# Patient Record
Sex: Female | Born: 1940
Health system: Southern US, Community
[De-identification: ages and names within clinical notes are randomized; demographics above are authoritative.]

## PROBLEM LIST (undated history)

## (undated) DIAGNOSIS — E43 Unspecified severe protein-calorie malnutrition: Secondary | ICD-10-CM

## (undated) DIAGNOSIS — R636 Underweight: Secondary | ICD-10-CM

## (undated) DIAGNOSIS — I639 Cerebral infarction, unspecified: Secondary | ICD-10-CM

## (undated) DIAGNOSIS — R42 Dizziness and giddiness: Secondary | ICD-10-CM

## (undated) DIAGNOSIS — G9340 Encephalopathy, unspecified: Principal | ICD-10-CM

## (undated) DIAGNOSIS — F0390 Unspecified dementia without behavioral disturbance: Secondary | ICD-10-CM

## (undated) HISTORY — DX: Unspecified severe protein-calorie malnutrition: E43

## (undated) HISTORY — DX: Unspecified dementia, unspecified severity, without behavioral disturbance, psychotic disturbance, mood disturbance, and anxiety: F03.90

## (undated) HISTORY — DX: Underweight: R63.6

## (undated) HISTORY — DX: Dizziness and giddiness: R42

## (undated) HISTORY — DX: Encephalopathy, unspecified: G93.40

---

## 2010-06-22 DIAGNOSIS — G9340 Encephalopathy, unspecified: Secondary | ICD-10-CM

## 2010-06-22 HISTORY — DX: Encephalopathy, unspecified: G93.40

## 2015-05-28 DIAGNOSIS — R404 Transient alteration of awareness: Secondary | ICD-10-CM | POA: Diagnosis not present

## 2015-05-28 DIAGNOSIS — R55 Syncope and collapse: Secondary | ICD-10-CM | POA: Diagnosis not present

## 2015-06-08 ENCOUNTER — Emergency Department (HOSPITAL_COMMUNITY): Payer: Commercial Managed Care - HMO

## 2015-06-08 ENCOUNTER — Observation Stay (HOSPITAL_COMMUNITY): Payer: Commercial Managed Care - HMO

## 2015-06-08 ENCOUNTER — Inpatient Hospital Stay (HOSPITAL_COMMUNITY)
Admission: EM | Admit: 2015-06-08 | Discharge: 2015-06-12 | DRG: 640 | Disposition: A | Discharge status: Skilled Nursing Facility | Payer: Commercial Managed Care - HMO | Attending: Internal Medicine | Admitting: Internal Medicine

## 2015-06-08 ENCOUNTER — Encounter (HOSPITAL_COMMUNITY): Payer: Self-pay

## 2015-06-08 DIAGNOSIS — R4781 Slurred speech: Secondary | ICD-10-CM | POA: Diagnosis present

## 2015-06-08 DIAGNOSIS — Z8673 Personal history of transient ischemic attack (TIA), and cerebral infarction without residual deficits: Secondary | ICD-10-CM

## 2015-06-08 DIAGNOSIS — A09 Infectious gastroenteritis and colitis, unspecified: Secondary | ICD-10-CM | POA: Diagnosis not present

## 2015-06-08 DIAGNOSIS — Z87898 Personal history of other specified conditions: Secondary | ICD-10-CM

## 2015-06-08 DIAGNOSIS — R636 Underweight: Secondary | ICD-10-CM | POA: Diagnosis present

## 2015-06-08 DIAGNOSIS — R55 Syncope and collapse: Secondary | ICD-10-CM | POA: Diagnosis not present

## 2015-06-08 DIAGNOSIS — R41841 Cognitive communication deficit: Secondary | ICD-10-CM | POA: Diagnosis not present

## 2015-06-08 DIAGNOSIS — E860 Dehydration: Secondary | ICD-10-CM | POA: Diagnosis present

## 2015-06-08 DIAGNOSIS — Z87440 Personal history of urinary (tract) infections: Secondary | ICD-10-CM | POA: Diagnosis not present

## 2015-06-08 DIAGNOSIS — R262 Difficulty in walking, not elsewhere classified: Secondary | ICD-10-CM | POA: Diagnosis not present

## 2015-06-08 DIAGNOSIS — R634 Abnormal weight loss: Secondary | ICD-10-CM

## 2015-06-08 DIAGNOSIS — G9340 Encephalopathy, unspecified: Secondary | ICD-10-CM | POA: Diagnosis present

## 2015-06-08 DIAGNOSIS — E43 Unspecified severe protein-calorie malnutrition: Secondary | ICD-10-CM | POA: Diagnosis present

## 2015-06-08 DIAGNOSIS — R278 Other lack of coordination: Secondary | ICD-10-CM | POA: Diagnosis not present

## 2015-06-08 DIAGNOSIS — F0390 Unspecified dementia without behavioral disturbance: Secondary | ICD-10-CM | POA: Diagnosis present

## 2015-06-08 DIAGNOSIS — Z681 Body mass index (BMI) 19 or less, adult: Secondary | ICD-10-CM

## 2015-06-08 DIAGNOSIS — R531 Weakness: Secondary | ICD-10-CM | POA: Diagnosis not present

## 2015-06-08 DIAGNOSIS — M6281 Muscle weakness (generalized): Secondary | ICD-10-CM | POA: Diagnosis not present

## 2015-06-08 DIAGNOSIS — R42 Dizziness and giddiness: Secondary | ICD-10-CM

## 2015-06-08 DIAGNOSIS — R197 Diarrhea, unspecified: Secondary | ICD-10-CM | POA: Diagnosis present

## 2015-06-08 LAB — CBC WITH DIFFERENTIAL/PLATELET
BASOS ABS: 0 10*3/uL (ref 0.0–0.1)
Basophils Relative: 0 %
Eosinophils Absolute: 0.1 10*3/uL (ref 0.0–0.7)
Eosinophils Relative: 1 %
HCT: 41.1 % (ref 36.0–46.0)
HEMOGLOBIN: 13.9 g/dL (ref 12.0–15.0)
LYMPHS ABS: 1.2 10*3/uL (ref 0.7–4.0)
LYMPHS PCT: 16 %
MCH: 30.9 pg (ref 26.0–34.0)
MCHC: 33.8 g/dL (ref 30.0–36.0)
MCV: 91.3 fL (ref 78.0–100.0)
Monocytes Absolute: 0.6 10*3/uL (ref 0.1–1.0)
Monocytes Relative: 7 %
NEUTROS PCT: 76 %
Neutro Abs: 5.9 10*3/uL (ref 1.7–7.7)
Platelets: 220 10*3/uL (ref 150–400)
RBC: 4.5 MIL/uL (ref 3.87–5.11)
RDW: 12.1 % (ref 11.5–15.5)
WBC: 7.8 10*3/uL (ref 4.0–10.5)

## 2015-06-08 LAB — COMPREHENSIVE METABOLIC PANEL
ALT: 14 U/L (ref 14–54)
ANION GAP: 10 (ref 5–15)
AST: 21 U/L (ref 15–41)
Albumin: 4.5 g/dL (ref 3.5–5.0)
Alkaline Phosphatase: 110 U/L (ref 38–126)
BUN: 21 mg/dL — ABNORMAL HIGH (ref 6–20)
CHLORIDE: 100 mmol/L — AB (ref 101–111)
CO2: 30 mmol/L (ref 22–32)
Calcium: 10.1 mg/dL (ref 8.9–10.3)
Creatinine, Ser: 0.89 mg/dL (ref 0.44–1.00)
Glucose, Bld: 148 mg/dL — ABNORMAL HIGH (ref 65–99)
POTASSIUM: 3.8 mmol/L (ref 3.5–5.1)
Sodium: 140 mmol/L (ref 135–145)
TOTAL PROTEIN: 7.7 g/dL (ref 6.5–8.1)
Total Bilirubin: 0.8 mg/dL (ref 0.3–1.2)

## 2015-06-08 LAB — URINALYSIS, ROUTINE W REFLEX MICROSCOPIC
GLUCOSE, UA: NEGATIVE mg/dL
HGB URINE DIPSTICK: NEGATIVE
Ketones, ur: NEGATIVE mg/dL
Nitrite: NEGATIVE
PROTEIN: NEGATIVE mg/dL
SPECIFIC GRAVITY, URINE: 1.03 (ref 1.005–1.030)
pH: 5 (ref 5.0–8.0)

## 2015-06-08 LAB — URINE MICROSCOPIC-ADD ON: RBC / HPF: NONE SEEN RBC/hpf (ref 0–5)

## 2015-06-08 MED ORDER — FOLIC ACID 1 MG PO TABS
1.0000 mg | ORAL_TABLET | Freq: Every day | ORAL | Status: DC
  Administered 2015-06-09: 1 mg via ORAL
  Filled 2015-06-08: qty 1

## 2015-06-08 MED ORDER — SODIUM CHLORIDE 0.9 % IV SOLN
INTRAVENOUS | Status: DC
  Administered 2015-06-08: via INTRAVENOUS

## 2015-06-08 MED ORDER — ASPIRIN 325 MG PO TABS
325.0000 mg | ORAL_TABLET | Freq: Four times a day (QID) | ORAL | Status: DC | PRN
  Filled 2015-06-08: qty 1

## 2015-06-08 MED ORDER — VITAMIN B-1 100 MG PO TABS
100.0000 mg | ORAL_TABLET | Freq: Every day | ORAL | Status: DC
  Administered 2015-06-09: 100 mg via ORAL
  Filled 2015-06-08: qty 1

## 2015-06-08 MED ORDER — ONDANSETRON HCL 4 MG PO TABS: 4.0000 mg | ORAL_TABLET | Freq: Four times a day (QID) | ORAL | Status: DC | PRN

## 2015-06-08 MED ORDER — SODIUM CHLORIDE 0.9 % IJ SOLN
3.0000 mL | Freq: Two times a day (BID) | INTRAMUSCULAR | Status: DC
  Administered 2015-06-09 – 2015-06-11 (×3): 3 mL via INTRAVENOUS

## 2015-06-08 MED ORDER — ONDANSETRON HCL 4 MG/2ML IJ SOLN: 4.0000 mg | Freq: Four times a day (QID) | INTRAMUSCULAR | Status: DC | PRN

## 2015-06-08 MED ORDER — ACETAMINOPHEN 650 MG RE SUPP: 650.0000 mg | Freq: Four times a day (QID) | RECTAL | Status: DC | PRN

## 2015-06-08 MED ORDER — ACETAMINOPHEN 325 MG PO TABS: 650.0000 mg | ORAL_TABLET | Freq: Four times a day (QID) | ORAL | Status: DC | PRN

## 2015-06-08 MED ORDER — HYDROCODONE-ACETAMINOPHEN 5-325 MG PO TABS: 1.0000 | ORAL_TABLET | ORAL | Status: DC | PRN

## 2015-06-08 MED ORDER — ADULT MULTIVITAMIN W/MINERALS CH
1.0000 | ORAL_TABLET | Freq: Every day | ORAL | Status: DC
  Administered 2015-06-09 – 2015-06-12 (×4): 1 via ORAL
  Filled 2015-06-08 (×4): qty 1

## 2015-06-08 NOTE — ED Notes (Signed)
Patient transported to CT 
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
## 2015-06-09 ENCOUNTER — Observation Stay (HOSPITAL_COMMUNITY): Payer: Commercial Managed Care - HMO

## 2015-06-09 ENCOUNTER — Encounter (HOSPITAL_COMMUNITY): Payer: Self-pay | Admitting: Internal Medicine

## 2015-06-09 ENCOUNTER — Observation Stay (HOSPITAL_BASED_OUTPATIENT_CLINIC_OR_DEPARTMENT_OTHER): Payer: Commercial Managed Care - HMO

## 2015-06-09 DIAGNOSIS — R4781 Slurred speech: Secondary | ICD-10-CM | POA: Diagnosis present

## 2015-06-09 DIAGNOSIS — F0390 Unspecified dementia without behavioral disturbance: Secondary | ICD-10-CM | POA: Diagnosis present

## 2015-06-09 DIAGNOSIS — Z8673 Personal history of transient ischemic attack (TIA), and cerebral infarction without residual deficits: Secondary | ICD-10-CM | POA: Diagnosis not present

## 2015-06-09 DIAGNOSIS — R197 Diarrhea, unspecified: Secondary | ICD-10-CM | POA: Diagnosis present

## 2015-06-09 DIAGNOSIS — R42 Dizziness and giddiness: Secondary | ICD-10-CM

## 2015-06-09 DIAGNOSIS — R636 Underweight: Secondary | ICD-10-CM | POA: Diagnosis present

## 2015-06-09 DIAGNOSIS — R55 Syncope and collapse: Secondary | ICD-10-CM | POA: Diagnosis not present

## 2015-06-09 DIAGNOSIS — E43 Unspecified severe protein-calorie malnutrition: Secondary | ICD-10-CM | POA: Diagnosis present

## 2015-06-09 DIAGNOSIS — A09 Infectious gastroenteritis and colitis, unspecified: Secondary | ICD-10-CM

## 2015-06-09 DIAGNOSIS — E860 Dehydration: Secondary | ICD-10-CM | POA: Diagnosis present

## 2015-06-09 DIAGNOSIS — G9340 Encephalopathy, unspecified: Secondary | ICD-10-CM | POA: Diagnosis present

## 2015-06-09 DIAGNOSIS — Z681 Body mass index (BMI) 19 or less, adult: Secondary | ICD-10-CM | POA: Diagnosis not present

## 2015-06-09 DIAGNOSIS — Z87440 Personal history of urinary (tract) infections: Secondary | ICD-10-CM | POA: Diagnosis not present

## 2015-06-09 LAB — CBC
HCT: 36 % (ref 36.0–46.0)
HEMOGLOBIN: 12.2 g/dL (ref 12.0–15.0)
MCH: 31.4 pg (ref 26.0–34.0)
MCHC: 33.9 g/dL (ref 30.0–36.0)
MCV: 92.8 fL (ref 78.0–100.0)
Platelets: 202 10*3/uL (ref 150–400)
RBC: 3.88 MIL/uL (ref 3.87–5.11)
RDW: 12.3 % (ref 11.5–15.5)
WBC: 5 10*3/uL (ref 4.0–10.5)

## 2015-06-09 LAB — VITAMIN B12: Vitamin B-12: 252 pg/mL (ref 180–914)

## 2015-06-09 LAB — HIV ANTIBODY (ROUTINE TESTING W REFLEX): HIV Screen 4th Generation wRfx: NONREACTIVE

## 2015-06-09 LAB — COMPREHENSIVE METABOLIC PANEL WITH GFR
ALT: 12 U/L — ABNORMAL LOW (ref 14–54)
AST: 18 U/L (ref 15–41)
Albumin: 3.7 g/dL (ref 3.5–5.0)
Alkaline Phosphatase: 92 U/L (ref 38–126)
Anion gap: 7 (ref 5–15)
BUN: 15 mg/dL (ref 6–20)
CO2: 27 mmol/L (ref 22–32)
Calcium: 9 mg/dL (ref 8.9–10.3)
Chloride: 104 mmol/L (ref 101–111)
Creatinine, Ser: 0.6 mg/dL (ref 0.44–1.00)
GFR calc Af Amer: >60 mL/min
GFR calc non Af Amer: >60 mL/min
Glucose, Bld: 115 mg/dL — ABNORMAL HIGH (ref 65–99)
Potassium: 3.7 mmol/L (ref 3.5–5.1)
Sodium: 138 mmol/L (ref 135–145)
Total Bilirubin: 0.6 mg/dL (ref 0.3–1.2)
Total Protein: 6.1 g/dL — ABNORMAL LOW (ref 6.5–8.1)

## 2015-06-09 LAB — C DIFFICILE QUICK SCREEN W PCR REFLEX
C Diff antigen: NEGATIVE
C Diff interpretation: NEGATIVE
C Diff toxin: NEGATIVE

## 2015-06-09 LAB — TSH: TSH: 1.782 u[IU]/mL (ref 0.350–4.500)

## 2015-06-09 LAB — RPR: RPR Ser Ql: NONREACTIVE

## 2015-06-09 LAB — MAGNESIUM: MAGNESIUM: 2 mg/dL (ref 1.7–2.4)

## 2015-06-09 LAB — PHOSPHORUS: Phosphorus: 3.5 mg/dL (ref 2.5–4.6)

## 2015-06-09 LAB — PREALBUMIN: Prealbumin: 13.1 mg/dL — ABNORMAL LOW (ref 18–38)

## 2015-06-09 MED ORDER — CETYLPYRIDINIUM CHLORIDE 0.05 % MT LIQD
7.0000 mL | Freq: Two times a day (BID) | OROMUCOSAL | Status: DC
  Administered 2015-06-09 – 2015-06-12 (×6): 7 mL via OROMUCOSAL

## 2015-06-09 MED ORDER — ENSURE ENLIVE PO LIQD
237.0000 mL | Freq: Two times a day (BID) | ORAL | Status: DC
  Administered 2015-06-09 – 2015-06-12 (×7): 237 mL via ORAL

## 2015-06-09 MED ORDER — METRONIDAZOLE IN NACL 5-0.79 MG/ML-% IV SOLN
500.0000 mg | Freq: Three times a day (TID) | INTRAVENOUS | Status: DC
  Administered 2015-06-09 – 2015-06-11 (×6): 500 mg via INTRAVENOUS
  Filled 2015-06-09 (×7): qty 100

## 2015-06-09 NOTE — Progress Notes (Signed)
Echocardiogram 2D Echocardiogram has been performed.  Nolon RodBrown, Tony 06/09/2015, 8:58 AM

## 2015-06-09 NOTE — Care Management Note (Signed)
Case Management Note  Patient Details  Name: Evelyn Flowers MRN: [id number] Date of Birth: Mar 31, 1941  Subjective/Objective:         Slurred speech / Lightheadedness / Vertigo           Action/Plan: NCM spoke to pt and gave permission to speak to Evelyn Flowers, Evelyn Flowers [phone]. Pt states she lives alone. She has been independent and able to drive prior to admission. No DME at home was needed. Contacted dtr via phone and she feels pt would benefit from SNF-rehab. States she works and not available to assist during the day. Dtr states she would like to speak with attending and CSW/CM on tomorrow to discuss dc plan. Pt states her PCP is Evelyn Flowers. Pt is on a waiting list to see Evelyn Flowers. Has not seen physician recently, did not recall her last visit with a PCP.   Expected Discharge Date:                  Expected Discharge Plan:  Skilled Nursing Facility  In-House Referral:  Clinical Social Work  Discharge planning Services  CM Consult  Post Acute Care Choice:  NA Choice offered to:  NA  DME Arranged:  N/A DME Agency:  NA  HH Arranged:  NA HH Agency:  NA  Status of Service:  In process, will continue to follow  Medicare Important Message Given:    Date Medicare IM Given:    Medicare IM give by:    Date Additional Medicare IM Given:    Additional Medicare Important Message give by:     If discussed at Long Length of Stay Meetings, dates discussed:    Additional Comments:  Evelyn Flowers, Evelyn Erker Ellen, RN 06/09/2015, 5:53 PM

## 2015-06-09 NOTE — Progress Notes (Addendum)
Patient ID: Evelyn Flowers, female   DOB: 07-06-1940, 74 y.o.   MRN: [id number] [facility] PROGRESS NOTE  Evelyn Flowers WUJ:[id number] DOB: Mar 19, 1941 DOA: 06/08/2015 PCP: Dr. Shelva Majestic  Brief narrative:    74 year old female with no significant past medical history. She presented to [facility] ED with worsening confusion, poor po intake, weight loss, weakness, slurred speech, lightheadedness but the symptoms were present for some time any have been present for last year or so. Pt was hemodynamically stable on admission. Blood work was unremarkable. NO acute ischemic changes on CT head or MRI brain.   Anticipated discharge: Depending on results of PT and vestibular therapy, possible D/C by 06/10/2015.  Assessment/Plan:    Active Problems:   Slurred speech / Lightheadedness / Vertigo - Long standing history of slurred speech but no focal deficits noted on physical exam - Able to swallow without difficulty at least the meds - SLP eval pending - 2 D ECHO is pending - PT eval pending - Vest therapy pending    Acute encephalopathy - Better mental status this am - Oriented to time, place and person - No acute intracranial findings on CT and MRI head/brain    Diarrhea, infectious etiology  - Unclear etiology but because of low grade fever of 87F possible infectious - More than 3 BM since this am - Will send stool for c.diff - Start flagyl 500 mg IV Q 8 hours    Severe protein calorie malnutrition / Underweight / Dehydration - Body mass index is 17.2 kg/(m^2). - Nutrition consulted   DVT Prophylaxis  - SCD's bilaterally    Code Status: Full.  Family Communication:  plan of care discussed with the patient Disposition Plan: Likely by 06/10/2015  IV access:  Peripheral IV  Procedures and diagnostic studies:    Dg Chest 2 View 06/08/2015  No definite acute process. Compression deformities in the thoracic spine, age indeterminate.   Ct Head Wo Contrast 06/08/2015  1.  No acute intracranial abnormalities. 2. Chronic microvascular disease and brain atrophy.   Mr Brain Wo Contrast 06/09/2015  1. No acute or focal intracranial abnormality to explain acute earlier a more vertigo. 2. Moderate age advanced atrophy and white matter disease. This likely reflects the sequela of chronic microvascular ischemia.  Medical Consultants:  None   Other Consultants:  PT SLP Vestibular therapy Nutrition  IAnti-Infectives:   None    Manson Passey, MD  [facility] Pager [phone]  Time spent in minutes: 25 minutes  If 7PM-7AM, please contact night-coverage [email or web address] Password [facility] 06/09/2015, 11:44 AM     HPI/Subjective: No acute overnight events. Patient reports no vomiting. She does not feel lightheaded,   Objective: Filed Vitals:   06/08/15 2200 06/08/15 2230 06/08/15 2329 06/09/15 0547  BP: 151/90 150/90 165/99 132/71  Pulse: 87 78 87 75  Temp:   98.3 F (36.8 C) 98.3 F (36.8 C)  TempSrc:   Oral Oral  Resp: Height:    (1.6 m)   Weight:   44.044 kg (97 lb 1.6 oz)   SpO2: 96% 97% 100% 96%    Intake/Output Summary (Last 24 hours) at 06/09/15 1144 Last data filed at 06/09/15 0900  Gross per 24 hour  Intake 476.25 ml  Output      0 ml  Net 476.25 ml    Exam:   General:  Pt is  not in acute distress  Cardiovascular: Regular rate and rhythm, S1/S2 (+)  Respiratory: Clear to auscultation bilaterally, no wheezing, no crackles, no rhonchi  Abdomen: Soft, non tender, non distended, bowel sounds present  Extremities: No edema, pulses DP and PT palpable bilaterally  Neuro: Slurred speech  Data Reviewed: Basic Metabolic Panel:  Recent Labs Lab 06/08/15 1615 06/09/15 0532  NA 140 138  K 3.8 3.7  CL 100* 104  CO2 30 27  GLUCOSE 148* 115*  BUN 21* 15  CREATININE 0.89 0.60  CALCIUM 10.1 9.0  MG  --  2.0  PHOS  --  3.5   Liver Function Tests:  Recent Labs Lab 06/08/15 1615 06/09/15 0532  AST 21 18   ALT 14 12*  ALKPHOS 110 92  BILITOT 0.8 0.6  PROT 7.7 6.1*  ALBUMIN 4.5 3.7   No results for input(s): LIPASE, AMYLASE in the last 168 hours. No results for input(s): AMMONIA in the last 168 hours. CBC:  Recent Labs Lab 06/08/15 1615 06/09/15 0532  WBC 7.8 5.0  NEUTROABS 5.9  --   HGB 13.9 12.2  HCT 41.1 36.0  MCV 91.3 92.8  PLT 220 202   Cardiac Enzymes: No results for input(s): CKTOTAL, CKMB, CKMBINDEX, TROPONINI in the last 168 hours. BNP: Invalid input(s): POCBNP CBG: No results for input(s): GLUCAP in the last 168 hours.  No results found for this or any previous visit (from the past 240 hour(s)).   Scheduled Meds: . feeding supplement   237 mL Oral BID BM  . folic acid  1 mg Oral Daily  . multivitamin   1 tablet Oral Daily  . thiamine  100 mg Oral Daily

## 2015-06-09 NOTE — Evaluation (Signed)
Physical Therapy Evaluation Patient Details Name: Evelyn HutchingMildred Flowers V MRN: [id number] DOB: [birth year] Today's Date: 06/09/2015   History of Present Illness  74 yo female admitted with slurred speech. Hx of vertigo per pt  Clinical Impression  On eval, pt required Min-Mod assist for mobility-walked ~15'x2 (to/from bathroom). Pt would not agree to ambulation in hallway. Pt denied lightheadedness/dizziness during session. Mobility eval completed prior to vestibular eval order. Will attempt to complete vestibular eval on another day, if still warranted. Assistance required for all tasks on today. Do not feel pt is safe to function at home alone. Recommend ST rehab at SNF.     Follow Up Recommendations SNF (if pt will agree. Otherwise, recommend HHPT and 24 hour supervision initially. May also benefit from home health aide.)    Equipment Recommendations   (to be determined)    Recommendations for Other Services       Precautions / Restrictions Precautions Precautions: Fall Restrictions Weight Bearing Restrictions: No      Mobility  Bed Mobility Overal bed mobility: Needs Assistance Bed Mobility: Supine to Sit;Sit to Supine     Supine to sit: Mod assist Sit to supine: Mod assist   General bed mobility comments: Assist for trunk and bil LEs. Increased time. Multimodal cues for technique and for pt to complete as much of task unassisted as she could. Pt denied dizziness/lightheadedness  Transfers Overall transfer level: Needs assistance Equipment used: 1 person hand held assist Transfers: Sit to/from Stand Sit to Stand: Min assist         General transfer comment: Assist to rise, stabilize, control descent. Increased time. Pt denied dizziness/lightheadedness.  Ambulation/Gait Ambulation/Gait assistance: Min assist Ambulation Distance (Feet): 15 Feet Assistive device: 1 person hand held assist;None Gait Pattern/deviations: Step-through pattern;Decreased stride  length;Decreased step length - right;Decreased step length - left     General Gait Details: very slow, short steps. Pt not agreeable to walking in hallway even though she denied being dizzy or lightheaded. So walked to/from bathroom only.  Stairs            Wheelchair Mobility    Modified Rankin (Stroke Patients Only)       Balance Overall balance assessment: Needs assistance         Standing balance support: During functional activity Standing balance-Leahy Scale: Fair                               Pertinent Vitals/Pain Pain Assessment: No/denies pain    Home Living Family/patient expects to be discharged to:: Private residence Living Arrangements: Alone   Type of Home: Apartment Home Access: Level entry     Home Layout: One level Home Equipment: None      Prior Function Level of Independence: Independent               Hand Dominance        Extremity/Trunk Assessment               Lower Extremity Assessment: Generalized weakness      Cervical / Trunk Assessment: Kyphotic  Communication   Communication: No difficulties  Cognition Arousal/Alertness: Awake/alert Behavior During Therapy: WFL for tasks assessed/performed Overall Cognitive Status: Difficult to assess                      General Comments      Exercises        Assessment/Plan  PT Assessment Patient needs continued PT services  PT Diagnosis Difficulty walking;Generalized weakness   PT Problem List Decreased strength;Decreased activity tolerance;Decreased balance;Decreased mobility  PT Treatment Interventions Gait training;Therapeutic activities;Functional mobility training;Patient/family education;Balance training;Therapeutic exercise   PT Goals (Current goals can be found in the Care Plan section) Acute Rehab PT Goals Patient Stated Goal: none stated PT Goal Formulation: With patient Time For Goal Achievement: 06/23/15 Potential to  Achieve Goals: Good    Frequency Min 3X/week   Barriers to discharge        Co-evaluation               End of Session Equipment Utilized During Treatment: Gait belt Activity Tolerance: Patient tolerated treatment well Patient left: in bed;with call bell/phone within reach;with bed alarm set      Functional Assessment Tool Used: clinical judgement Functional Limitation: Mobility: Walking and moving around Mobility: Walking and Moving Around Current Status (V7846): At least 20 percent but less than 40 percent impaired, limited or restricted Mobility: Walking and Moving Around Goal Status [phone]): At least 1 percent but less than 20 percent impaired, limited or restricted    Time: 1141-1203 PT Time Calculation (min) (ACUTE ONLY): 22 min   Charges:   PT Evaluation $Initial PT Evaluation Tier I: 1 Procedure     PT G Codes:   PT G-Codes **NOT FOR INPATIENT CLASS** Functional Assessment Tool Used: clinical judgement Functional Limitation: Mobility: Walking and moving around Mobility: Walking and Moving Around Current Status (M8413): At least 20 percent but less than 40 percent impaired, limited or restricted Mobility: Walking and Moving Around Goal Status [phone]): At least 1 percent but less than 20 percent impaired, limited or restricted    Rebeca Alert, MPT Pager: [phone]
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
## 2015-06-10 ENCOUNTER — Inpatient Hospital Stay (HOSPITAL_COMMUNITY): Payer: Commercial Managed Care - HMO

## 2015-06-10 DIAGNOSIS — R55 Syncope and collapse: Secondary | ICD-10-CM

## 2015-06-10 LAB — URINE CULTURE

## 2015-06-10 LAB — FOLATE RBC
FOLATE, RBC: 884 ng/mL (ref >498)
Folate, Hemolysate: 299.7 ng/mL
Hematocrit: 33.9 % — ABNORMAL LOW (ref 34.0–46.6)

## 2015-06-10 MED ORDER — SODIUM CHLORIDE 0.9 % IV SOLN: INTRAVENOUS | Status: DC

## 2015-06-10 MED ORDER — SODIUM CHLORIDE 0.9 % IV SOLN
INTRAVENOUS | Status: DC
  Administered 2015-06-11 (×2): via INTRAVENOUS

## 2015-06-10 NOTE — Progress Notes (Signed)
Order received to discontinue SLP eval, please reorder if indicated. Donavan Burnetamara Tashi Andujo, MS [facility] S[facility] [phone]

## 2015-06-10 NOTE — Evaluation (Signed)
Occupational Therapy Evaluation Patient Details Name: Evelyn Flowers MRN: [id number] DOB: 12/17/1940 Today's Date: 06/10/2015    History of Present Illness 74 yo female admitted with slurred speech. Hx of vertigo per pt   Clinical Impression   Pt admitted with slurred speech. Pt currently with functional limitations due to the deficits listed below (see OT Problem List).  Pt will benefit from skilled OT to increase their safety and independence with ADL and functional mobility for ADL to facilitate discharge to venue listed below.      Follow Up Recommendations  SNF    Equipment Recommendations  None recommended by OT    Recommendations for Other Services       Precautions / Restrictions Precautions Precautions: Fall Restrictions Weight Bearing Restrictions: No      Mobility Bed Mobility Overal bed mobility: Needs Assistance Bed Mobility: Supine to Sit;Sit to Supine     Supine to sit: Mod assist Sit to supine: Mod assist      Transfers Overall transfer level: Needs assistance Equipment used: 1 person hand held assist Transfers: Sit to/from Stand Sit to Stand: Min assist         General transfer comment: Assist to rise, stabilize, control descent. Increased time.          ADL Overall ADL's : Needs assistance/impaired Eating/Feeding: Set up;Sitting   Grooming: Set up;Sitting   Upper Body Bathing: Minimal assitance;Sitting   Lower Body Bathing: Maximal assistance;Sit to/from stand   Upper Body Dressing : Minimal assistance;Sitting   Lower Body Dressing: Maximal assistance;Sit to/from stand       Toileting- ArchitectClothing Manipulation and Hygiene: Maximal assistance;Sit to/from stand Toileting - Clothing Manipulation Details (indicate cue type and reason): max A for hygiene                       Pertinent Vitals/Pain Pain Assessment: No/denies pain     Hand Dominance     Extremity/Trunk Assessment Upper Extremity Assessment Upper  Extremity Assessment: Generalized weakness           Communication Communication Communication: No difficulties;Other (comment) (slurred speech)   Cognition Arousal/Alertness: Awake/alert Behavior During Therapy: WFL for tasks assessed/performed Overall Cognitive Status: Impaired/Different from baseline Area of Impairment: Safety/judgement         Safety/Judgement: Decreased awareness of safety;Decreased awareness of deficits                    Home Living Family/patient expects to be discharged to:: Private residence Living Arrangements: Alone   Type of Home: Apartment Home Access: Level entry     Home Layout: One level     Bathroom Shower/Tub: Chief Strategy OfficerTub/shower unit   Bathroom Toilet: Standard     Home Equipment: None          Prior Functioning/Environment Level of Independence: Independent             OT Diagnosis: Generalized weakness   OT Problem List: Decreased strength;Decreased activity tolerance;Decreased safety awareness   OT Treatment/Interventions: Self-care/ADL training;Patient/family education;Therapeutic activities    OT Goals(Current goals can be found in the care plan section) Acute Rehab OT Goals Patient Stated Goal: get stronger so i can go home OT Goal Formulation: With patient Time For Goal Achievement: 06/24/15 Potential to Achieve Goals: Good  OT Frequency: Min 2X/week   Barriers to D/C: Decreased caregiver support          Co-evaluation  End of Session Nurse Communication: Mobility status  Activity Tolerance: Patient tolerated treatment well Patient left: in bed;with call bell/phone within reach;with family/visitor present;with bed alarm set   Time: 1330-1353 OT Time Calculation (min): 23 min Charges:  OT General Charges $OT Visit: 1 Procedure OT Evaluation $Initial OT Evaluation Tier I: 1 Procedure OT Treatments $Self Care/Home Management : 8-22 mins G-Codes:    Einar Crow  D 06/10/2015, 2:02 PM

## 2015-06-10 NOTE — NC FL2 (Signed)
[place] MEDICAID FL2 LEVEL OF CARE SCREENING TOOL     IDENTIFICATION  Patient Name: Evelyn Flowers V Birthdate: 15-Dec-1940 Sex: female Admission Date (Current Location): 06/08/2015  [facility] [place]   Facility and Address:  [facility],  [address], [place] [id number]      Provider Number: [id number]  Attending Physician Name and Address:  Alison MurrayAlma M Devine, MD  Relative Name and Phone Number:       Current Level of Care: Hospital Recommended Level of Care: Skilled Nursing Facility Prior Approval Number:    Date Approved/Denied: 06/10/15 PASRR Number: [id number]  Discharge Plan: SNF    Current Diagnoses: Patient Active Problem List   Diagnosis Date Noted  . Underweight 06/09/2015  . Protein-calorie malnutrition, severe (HCC) 06/09/2015  . Acute encephalopathy 06/09/2015  . Lightheadedness 06/09/2015  . Dehydration 06/09/2015  . Diarrhea 06/09/2015  . Slurred speech 06/08/2015  . Vertigo 06/08/2015    Orientation RESPIRATION BLADDER Height & Weight    Self, Time, Situation, Place  Normal Incontinent 5\' 3"  (160 cm) 97 lbs.  BEHAVIORAL SYMPTOMS/MOOD NEUROLOGICAL BOWEL NUTRITION STATUS      Continent    AMBULATORY STATUS COMMUNICATION OF NEEDS Skin   Extensive Assist Verbally Normal                       Personal Care Assistance Level of Assistance  Bathing, Feeding, Dressing Bathing Assistance: Limited assistance Feeding assistance: Independent Dressing Assistance: Limited assistance     Functional Limitations Info  Sight, Hearing, Speech Sight Info: Adequate Hearing Info: Adequate Speech Info: Adequate    SPECIAL CARE FACTORS FREQUENCY  OT (By licensed OT), PT (By licensed PT)     PT Frequency: 5sx a week OT Frequency: 2xs a week                                                                                                                                   Contractures      Additional  Factors Info  Code Status, Allergies Code Status Info: FULL Allergies Info: No Known Allergies           Current Medications (06/10/2015):  This is the current hospital active medication list Current Facility-Administered Medications  Medication Dose Route Frequency Provider Last Rate Last Dose  . 0.9 %  sodium chloride infusion   Intravenous Continuous Alison MurrayAlma M Devine, MD      . acetaminophen (TYLENOL) tablet 650 mg  650 mg Oral Q6H PRN Therisa DoyneAnastassia Doutova, MD       Or  . acetaminophen (TYLENOL) suppository 650 mg  650 mg Rectal Q6H PRN Therisa DoyneAnastassia Doutova, MD      . antiseptic oral rinse (CPC / CETYLPYRIDINIUM CHLORIDE 0.05%) solution 7 mL  7 mL Mouth Rinse BID Therisa DoyneAnastassia Doutova, MD   7 mL at 06/09/15 2138  . aspirin tablet 325 mg  325 mg Oral Q6H PRN  Therisa Doyne, MD      . feeding supplement (ENSURE ENLIVE) (ENSURE ENLIVE) liquid 237 mL  237 mL Oral BID BM Anastassia Doutova, MD   237 mL at 06/10/15 1009  . metroNIDAZOLE (FLAGYL) IVPB 500 mg  500 mg Intravenous Q8H Alison Murray, MD   500 mg at 06/10/15 0809  . multivitamin with minerals tablet 1 tablet  1 tablet Oral Daily Therisa Doyne, MD   1 tablet at 06/10/15 1009  . ondansetron (ZOFRAN) tablet 4 mg  4 mg Oral Q6H PRN Therisa Doyne, MD       Or  . ondansetron (ZOFRAN) injection 4 mg  4 mg Intravenous Q6H PRN Therisa Doyne, MD      . sodium chloride 0.9 % injection 3 mL  3 mL Intravenous Q12H Therisa Doyne, MD   3 mL at 06/09/15 2138     Discharge Medications: Please see discharge summary for a list of discharge medications.  Relevant Imaging Results:  Relevant Lab Results:   Additional Information Social Security number: [id number]  Loleta Dicker, LCSW

## 2015-06-10 NOTE — Progress Notes (Signed)
Physical Therapy Treatment Patient Details Name: Doy HutchingMildred Haxton V MRN: [id number] DOB: [birth year] Today's Date: 06/10/2015    History of Present Illness 74 yo female admitted with slurred speech. Hx of vertigo per pt    PT Comments    Daughter present and helped encourage mobility today.  Pt tolerated ambulation well and denies any dizziness. Pt did not report (and denies) any dizziness with positional changes and ambulation during session.  Follow Up Recommendations  SNF     Equipment Recommendations  Rolling walker with 5" wheels (would benefit however may refuse)    Recommendations for Other Services       Precautions / Restrictions Precautions Precautions: Fall Restrictions Weight Bearing Restrictions: No    Mobility  Bed Mobility Overal bed mobility: Needs Assistance Bed Mobility: Supine to Sit;Sit to Supine     Supine to sit: Min assist Sit to supine: Min assist   General bed mobility comments: assist for trunk upright and scooting to edge of bed  Transfers Overall transfer level: Needs assistance Equipment used: None Transfers: Sit to/from Stand Sit to Stand: Min guard         General transfer comment: assist to steady with rise  Ambulation/Gait Ambulation/Gait assistance: Min assist Ambulation Distance (Feet): 150 Feet (x2) Assistive device:  (pushed IV pole) Gait Pattern/deviations: Step-through pattern;Narrow base of support;Decreased stride length     General Gait Details: very slow, short steps, denies dizziness only reports fatigue and "winded"  assist to steady occasionally   Stairs            Wheelchair Mobility    Modified Rankin (Stroke Patients Only)       Balance                                    Cognition Arousal/Alertness: Awake/alert Behavior During Therapy: WFL for tasks assessed/performed Overall Cognitive Status: Impaired/Different from baseline Area of Impairment: Safety/judgement         Safety/Judgement: Decreased awareness of safety;Decreased awareness of deficits          Exercises      General Comments        Pertinent Vitals/Pain Pain Assessment: No/denies pain    Home Living Family/patient expects to be discharged to:: Private residence Living Arrangements: Alone   Type of Home: Apartment Home Access: Level entry   Home Layout: One level Home Equipment: None      Prior Function Level of Independence: Independent          PT Goals (current goals can now be found in the care plan section) Acute Rehab PT Goals Patient Stated Goal: get stronger so i can go home Progress towards PT goals: Progressing toward goals    Frequency  Min 3X/week    PT Plan Current plan remains appropriate    Co-evaluation             End of Session Equipment Utilized During Treatment: Gait belt Activity Tolerance: Patient tolerated treatment well Patient left: in bed;with call bell/phone within reach;with bed alarm set;with family/visitor present     Time: 1439-1450 PT Time Calculation (min) (ACUTE ONLY): 11 min  Charges:  $Gait Training: 8-22 mins                    G Codes:      Rashied Corallo,KATHrine E 06/10/2015, 4:42 PM Zenovia JarredKati Nilo Fallin, PT, DPT 06/10/2015 Pager: [phone]

## 2015-06-10 NOTE — Progress Notes (Signed)
CSW received consult regarding nursing home placement. CSW went to speak with patient regarding the possible need for short term rehab. CSW introduced self and acknowledged the patient. Patient is alert and oriented. Patient was calm and cooperative with CSW assessment. Patient's daughter Carollee HerterShannon was at bedside. Patient provided CSW with permission to speak while family is in room. CSW informed patient of PT recommendation for short term rehab. Patient and family is agreeable to SNF placement. CSW provided patient with a list of SNF facilities, and was provided permission to fax clinical information out [place] the [place] in [place]. Patient and family's preferences: (1)[facility], (2)[facility], and (3) [facility]. CSW to complete FL2 for MD signature. CSW to initiate SNF placement process.   No further CSW needs were requested at this time. CSW will continue to follow and provide support to patient while in hospital.   Evelyn BoydenJoyce Makana Feigel, [facility] Clinical Social Worker [facility] [phone]
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
## 2015-06-10 NOTE — Progress Notes (Signed)
*  PRELIMINARY RESULTS* Vascular Ultrasound Carotid Duplex (Doppler) has been completed.  Findings suggest 1-39% internal carotid artery stenosis bilaterally. Vertebral arteries are patent with antegrade flow.  06/10/2015 10:33 AM Gertie FeyMichelle Kayman Snuffer, RVT, RDCS, RDMS

## 2015-06-11 MED ORDER — ENSURE ENLIVE PO LIQD: 237.0000 mL | Freq: Two times a day (BID) | ORAL | Status: DC

## 2015-06-11 MED ORDER — ACETAMINOPHEN 325 MG PO TABS: 650.0000 mg | ORAL_TABLET | Freq: Four times a day (QID) | ORAL | Status: AC | PRN

## 2015-06-11 NOTE — Discharge Instructions (Signed)

## 2015-06-11 NOTE — Progress Notes (Signed)
Patient and daughter has decided [facility]. CSW informed facility representative Eber JonesCarolyn regarding patient and family's decision. Bed will be available for patient on tomorrow. CSW was informed by MD that patient will discharge on tomorrow 12/21, as date is also noted on dc summary. CSW will continue to follow and provided support to patient and family while in hospital.   Fernande BoydenJoyce Earline Stiner, [facility]WA Clinical Social Worker [facility] [phone]

## 2015-06-11 NOTE — Discharge Summary (Signed)
Physician Discharge Summary  Evelyn Flowers DOB: Jan 28, 1941 DOA: 06/08/2015  PCP: Dr. Shelva MajesticZachary Hall  Admit date: 06/08/2015 Discharge date: 06/12/2015  Recommendations for Outpatient Follow-up:  1. No new medications on discharge 2. Check CBC and BMP per SNF protocol   Discharge Diagnoses:  Principal Problem:   Slurred speech Active Problems:   Vertigo   Acute encephalopathy   Lightheadedness   Underweight   Protein-calorie malnutrition, severe (HCC)   Dehydration   Diarrhea    Discharge Condition: stable   Diet recommendation: as tolerated   History of present illness:  74 year old female with no significant past medical history. [facility] with worsening confusion, poor po intake, weight loss, weakness, slurred speech, lightheadedness but the symptoms were present for some time any have been present for last year or so. Pt was hemodynamically stable on admission. Blood work was unremarkable. No acute ischemic changes on CT head or MRI brain.   Hospital Course:   Assessment/Plan:    Active Problems:  Slurred speech / Lightheadedness / Vertigo - Long standing history of slurred speech but no focal deficits noted on physical exam - Patient ate soft diet okay so without difficulty - Carotid Doppler was unremarkable. 2-D echo shows normal ejection fraction with grade 1 diastolic dysfunction. - Recommendation for SNF per PT eval, appreciate social work assisting discharge planning. - As documented before, per patient's daughter, patient lives in very poor environment at home with potentially even a mold and in addition she's not able to take care of herself and she lives alone. Her mental status is also worse over last at least couple of months per family. She has memory loss which family notes to be more prominent now.   Acute encephalopathy - No significant changes in mental status.  - Patient still has slurred speech - No acute  findings and CT or MRI however she did probably have stroke in past   Diarrhea, infectious etiology  - Unclear etiology but because of low grade fever of 28F possible infectious.  - Stool for C. difficile was negative.  - Diarrhea has improved with Flagyl. Will stop Flagyl today.   Severe protein calorie malnutrition / Underweight / Dehydration - Body mass index is 17.2 kg/(m^2). - Nutrition consulted, continue nutritional supplementation as recommended by dietitian  DVT Prophylaxis  - SCD's bilaterally in hospital   Code Status: Full.  Family Communication: plan of care discussed with the patient and her daughter at the bedside   IV access:  Peripheral IV  Procedures and diagnostic studies:   Dg Chest 2 View 06/08/2015 No definite acute process. Compression deformities in the thoracic spine, age indeterminate.   Ct Head Wo Contrast 06/08/2015 1. No acute intracranial abnormalities. 2. Chronic microvascular disease and brain atrophy.   Mr Brain Wo Contrast 06/09/2015 1. No acute or focal intracranial abnormality to explain acute earlier a more vertigo. 2. Moderate age advanced atrophy and white matter disease. This likely reflects the sequela of chronic microvascular ischemia.  Medical Consultants:  None   Other Consultants:  PT Vestibular therapy Nutrition  IAnti-Infectives:   None   Signed:  Manson PasseyEVINE, ALMA, MD  [facility] 06/11/2015, 11:51 AM  Pager #: [phone]  Time spent in minutes: more than 30 minutes  Discharge Exam: Filed Vitals:   06/10/15 2204 06/11/15 0439  BP: 130/64 133/65  Pulse: 81 65  Temp: 98.1 F (36.7 C) 98.2 F (36.8 C)  Resp: 18 20   Filed Vitals:  06/09/15 2231 06/10/15 0512 06/10/15 2204 06/11/15 0439  BP: 123/65 124/63 130/64 133/65  Pulse: 87 77 81 65  Temp: 98.2 F (36.8 C) 98.6 F (37 C) 98.1 F (36.7 C) 98.2 F (36.8 C)  TempSrc: Oral Oral Oral Oral  Resp: Height:       Weight:      SpO2: 98% 96% 98% 97%    General: Pt is alert, not in acute distress Cardiovascular: Regular rate and rhythm, S1/S2 + Respiratory: Clear to auscultation bilaterally, no wheezing, no crackles, no rhonchi Abdominal: Soft, non tender, non distended, bowel sounds +, no guarding Extremities: no edema, no cyanosis, pulses palpable bilaterally DP and PT Neuro: Grossly nonfocal  Discharge Instructions  Discharge Instructions    Call MD for:  difficulty breathing, headache or visual disturbances    Complete by:  As directed      Call MD for:  persistant dizziness or light-headedness    Complete by:  As directed      Call MD for:  persistant nausea and vomiting    Complete by:  As directed      Call MD for:  severe uncontrolled pain    Complete by:  As directed      Diet - low sodium heart healthy    Complete by:  As directed      Increase activity slowly    Complete by:  As directed             Medication List    TAKE these medications        acetaminophen 325 MG tablet  Commonly known as:  TYLENOL  Take 2 tablets (650 mg total) by mouth every 6 (six) hours as needed for mild pain (or Fever >/= 101).     aspirin 325 MG tablet  Take 325 mg by mouth every 6 (six) hours as needed for mild pain or headache.     feeding supplement (ENSURE ENLIVE) Liqd  Take 237 mLs by mouth 2 (two) times daily between meals.          The results of significant diagnostics from this hospitalization (including imaging, microbiology, ancillary and laboratory) are listed below for reference.    Significant Diagnostic Studies: Dg Chest 2 View  06/08/2015  CLINICAL DATA:  Syncope. EXAM: CHEST  2 VIEW COMPARISON:  None. FINDINGS: Heart is normal in size, mild tortuosity of the thoracic aorta. The lungs are clear, borderline hyperinflation. Pulmonary vasculature is normal. No consolidation, pleural effusion, or pneumothorax. Mild compression deformity at the thoracolumbar junction and  mid thoracic spine, age indeterminate. Remote right proximal humeral fracture. Degenerative change in the left shoulder. The bones are under mineralized. IMPRESSION: No definite acute process. Compression deformities in the thoracic spine, age indeterminate. Electronically Signed   By: Rubye Oaks M.D.   On: 06/08/2015 23:22   Ct Head Wo Contrast  06/08/2015  CLINICAL DATA:  Weakness and dizziness.  Loss of weight. EXAM: CT HEAD WITHOUT CONTRAST TECHNIQUE: Contiguous axial images were obtained from the base of the skull through the vertex without intravenous contrast. COMPARISON:  None FINDINGS: There is prominence of the sulci and ventricles consistent with brain atrophy. Low attenuation throughout the periventricular and subcortical white matter is noted consistent with chronic microvascular disease. There is no abnormal extra-axial fluid collection, intracranial hemorrhage or mass. No evidence for acute brain infarct. The paranasal sinuses and mastoid air cells are clear. The calvarium is intact. IMPRESSION: 1. No acute intracranial abnormalities.  2. Chronic microvascular disease and brain atrophy. Electronically Signed   By: Signa Kell M.D.   On: 06/08/2015 19:04   Mr Brain Wo Contrast  06/09/2015  CLINICAL DATA:  Delirium and slurred speech. Syncopal episodes. Vertigo. EXAM: MRI HEAD WITHOUT CONTRAST TECHNIQUE: Multiplanar, multiecho pulse sequences of the brain and surrounding structures were obtained without intravenous contrast. COMPARISON:  CT head without contrast 06/08/2015 FINDINGS: The diffusion-weighted images demonstrate no evidence for acute or subacute infarction. Moderate atrophy and periventricular white matter disease is present bilaterally. A prominent choroidal fissure cyst is present on the left. Dilated perivascular spaces are noted. No acute hemorrhage or mass lesion is present. The ventricles are proportionate to the degree of atrophy. The internal auditory canals are  within normal limits. The brainstem and cerebellum are unremarkable. Flow is present in the major intracranial arteries. The globes and orbits are intact. The paranasal sinuses and mastoid air cells are clear. IMPRESSION: 1. No acute or focal intracranial abnormality to explain acute earlier a more vertigo. 2. Moderate age advanced atrophy and white matter disease. This likely reflects the sequela of chronic microvascular ischemia. Electronically Signed   By: Marin Roberts M.D.   On: 06/09/2015 10:47    Microbiology: Recent Results (from the past 240 hour(s))  Urine culture     Status: None   Collection Time: 06/08/15  5:20 PM  Result Value Ref Range Status   Specimen Description URINE, CLEAN CATCH  Final   Special Requests NONE  Final   Culture   Final    MULTIPLE SPECIES PRESENT, SUGGEST RECOLLECTION Performed at [facility]    Report Status 06/10/2015 FINAL  Final  C difficile quick scan w PCR reflex     Status: None   Collection Time: 06/09/15  9:15 PM  Result Value Ref Range Status   C Diff antigen NEGATIVE NEGATIVE Final   C Diff toxin NEGATIVE NEGATIVE Final   C Diff interpretation Negative for toxigenic C. difficile  Final     Labs: Basic Metabolic Panel:  Recent Labs Lab 06/08/15 1615 06/09/15 0532  NA 140 138  K 3.8 3.7  CL 100* 104  CO2 30 27  GLUCOSE 148* 115*  BUN 21* 15  CREATININE 0.89 0.60  CALCIUM 10.1 9.0  MG  --  2.0  PHOS  --  3.5   Liver Function Tests:  Recent Labs Lab 06/08/15 1615 06/09/15 0532  AST 21 18  ALT 14 12*  ALKPHOS 110 92  BILITOT 0.8 0.6  PROT 7.7 6.1*  ALBUMIN 4.5 3.7   No results for input(s): LIPASE, AMYLASE in the last 168 hours. No results for input(s): AMMONIA in the last 168 hours. CBC:  Recent Labs Lab 06/08/15 1615 06/09/15 0532  WBC 7.8 5.0  NEUTROABS 5.9  --   HGB 13.9 12.2  HCT 41.1 36.0  33.9*  MCV 91.3 92.8  PLT 220 202   Cardiac Enzymes: No results for input(s): CKTOTAL, CKMB,  CKMBINDEX, TROPONINI in the last 168 hours. BNP: BNP (last 3 results) No results for input(s): BNP in the last 8760 hours.  ProBNP (last 3 results) No results for input(s): PROBNP in the last 8760 hours.  CBG: No results for input(s): GLUCAP in the last 168 hours.

## 2015-06-11 NOTE — Progress Notes (Signed)
CSW spoke with patient's daughter Carollee HerterShannon to provide bed offers. Daughter informed CSW that she would like to follow up with a few of the facilities, and then she will follow up with CSW later today. No further CSW needs requested at this time. CSW will continue to follow and provide support to patient while in hospital.   Fernande BoydenJoyce Jamesa Tedrick, [facility] Clinical Social Worker [facility] [phone]

## 2015-06-12 DIAGNOSIS — R41841 Cognitive communication deficit: Secondary | ICD-10-CM | POA: Diagnosis not present

## 2015-06-12 DIAGNOSIS — R197 Diarrhea, unspecified: Secondary | ICD-10-CM | POA: Diagnosis not present

## 2015-06-12 DIAGNOSIS — R55 Syncope and collapse: Secondary | ICD-10-CM | POA: Diagnosis not present

## 2015-06-12 DIAGNOSIS — E860 Dehydration: Secondary | ICD-10-CM | POA: Diagnosis not present

## 2015-06-12 DIAGNOSIS — R42 Dizziness and giddiness: Secondary | ICD-10-CM | POA: Diagnosis not present

## 2015-06-12 DIAGNOSIS — R531 Weakness: Secondary | ICD-10-CM | POA: Diagnosis not present

## 2015-06-12 DIAGNOSIS — R278 Other lack of coordination: Secondary | ICD-10-CM | POA: Diagnosis not present

## 2015-06-12 DIAGNOSIS — M6281 Muscle weakness (generalized): Secondary | ICD-10-CM | POA: Diagnosis not present

## 2015-06-12 DIAGNOSIS — R262 Difficulty in walking, not elsewhere classified: Secondary | ICD-10-CM | POA: Diagnosis not present

## 2015-06-12 DIAGNOSIS — G9340 Encephalopathy, unspecified: Secondary | ICD-10-CM | POA: Diagnosis not present

## 2015-06-12 DIAGNOSIS — E46 Unspecified protein-calorie malnutrition: Secondary | ICD-10-CM | POA: Diagnosis not present

## 2015-06-12 DIAGNOSIS — E43 Unspecified severe protein-calorie malnutrition: Secondary | ICD-10-CM | POA: Diagnosis not present

## 2015-06-12 DIAGNOSIS — R4781 Slurred speech: Secondary | ICD-10-CM | POA: Diagnosis not present

## 2015-06-12 NOTE — Progress Notes (Signed)
Pt for discharge to [facility].  CSW received insurance auth through [employer] (auth#: [id number]).  CSW facilitated pt discharge needs including contacting facility, faxing pt discharge information via [employer], discussing with pt and pt daughter, Evelyn Flowers at bedside, providing RN phone number to call report, and providing discharge packet to pt daughter to provide to [facility] upon arrival as pt daughter plans to transport pt via private vehicle.   [facility] aware that pt daughter transporting via private vehicle in order for facility to provide assistance to pt upon arrival.   No further social work needs identified at this time.  CSW signing off.   Evelyn Flowers, MSW, LCSW Clinical Social Work 5E coverage [phone]

## 2015-06-12 NOTE — Clinical Social Work Placement (Signed)
   CLINICAL SOCIAL WORK PLACEMENT  NOTE  Date:  06/12/2015  Patient Details  Name: Evelyn Flowers MRN: [id number] Date of Birth: 04-06-1941  Clinical Social Work is seeking post-discharge placement for this patient at the Skilled  Nursing Facility level of care (*CSW will initial, date and re-position this form in  chart as items are completed):  Yes   Patient/family provided with [place] Clinical Social Work Department's list of facilities offering this level of care within the geographic area requested by the patient (or if unable, by the patient's family).  Yes   Patient/family informed of their freedom to choose among providers that offer the needed level of care, that participate in Medicare, Medicaid or managed care program needed by the patient, have an available bed and are willing to accept the patient.  Yes   Patient/family informed of [facility]'s ownership interest in [facility] and [facility], as well as of the fact that they are under no obligation to receive care at these facilities.  PASRR submitted to EDS on 06/11/15     PASRR number received on 06/11/15     Existing PASRR number confirmed on       FL2 transmitted to all facilities in geographic area requested by pt/family on 06/11/15     FL2 transmitted to all facilities within larger geographic area on       Patient informed that his/her managed care company has contracts with or will negotiate with certain facilities, including the following:        Yes   Patient/family informed of bed offers received.  [facility]     Physician recommends and patient chooses bed at      [facility].  Patient to be transferred to facility by       Patient family notified on 06/12/15 of transfer.  Name of family member notified:        PHYSICIAN Please sign FL2     Additional Comment:     _______________________________________________ Orson EvaKIDD, Kauri Garson A, LCSW 06/12/2015, 12:38 PM

## 2015-06-12 NOTE — Progress Notes (Signed)
Pt medically stable for discharge today to SNF Please refer to discharge summary completed 06/11/2015. No changes in medical management since 06/11/2015.  Manson Passeylma [facility] [phone]

## 2015-06-12 NOTE — Care Management Important Message (Signed)
Important Message  Patient Details  Name: Evelyn HutchingMildred Meiser Flowers MRN: [id number] Date of Birth: 05/05/1941   Medicare Important Message Given:  Yes    Haskell FlirtJamison, Bradely Rudin 06/12/2015, 11:29 AMImportant Message  Patient Details  Name: Evelyn HutchingMildred [place] Flowers MRN: [id number] Date of Birth: 05/05/1941   Medicare Important Message Given:  Yes    Haskell FlirtJamison, Evvie Behrmann 06/12/2015, 11:27 AM

## 2015-06-12 NOTE — Care Management Note (Signed)
Case Management Note  Patient Details  Name: Evelyn HutchingMildred Courts Flowers MRN: [id number] Date of Birth: Sep 20, 1940  Subjective/Objective:                    Action/Plan:d/c snf.   Expected Discharge Date:                  Expected Discharge Plan:  Skilled Nursing Facility  In-House Referral:  Clinical Social Work  Discharge planning Services  CM Consult  Post Acute Care Choice:  NA Choice offered to:  NA  DME Arranged:  N/A DME Agency:  NA  HH Arranged:  NA HH Agency:  NA  Status of Service:  Completed, signed off  Medicare Important Message Given:  Yes Date Medicare IM Given:    Medicare IM give by:    Date Additional Medicare IM Given:    Additional Medicare Important Message give by:     If discussed at Long Length of Stay Meetings, dates discussed:    Additional Comments:  Lanier ClamMahabir, Caili Escalera, RN 06/12/2015, 2:18 PM

## 2015-06-12 NOTE — Clinical Social Work Placement (Signed)
   CLINICAL SOCIAL WORK PLACEMENT  NOTE  Date:  06/12/2015  Patient Details  Name: Evelyn Flowers MRN: [id number] Date of Birth: Sep 25, 1940  Clinical Social Work is seeking post-discharge placement for this patient at the Skilled  Nursing Facility level of care (*CSW will initial, date and re-position this form in  chart as items are completed):  Yes   Patient/family provided with [place] Clinical Social Work Department's list of facilities offering this level of care within the geographic area requested by the patient (or if unable, by the patient's family).  Yes   Patient/family informed of their freedom to choose among providers that offer the needed level of care, that participate in Medicare, Medicaid or managed care program needed by the patient, have an available bed and are willing to accept the patient.  Yes   Patient/family informed of [facility]'s ownership interest in [facility] and [facility], as well as of the fact that they are under no obligation to receive care at these facilities.  PASRR submitted to EDS on 06/11/15     PASRR number received on 06/11/15     Existing PASRR number confirmed on       FL2 transmitted to all facilities in geographic area requested by pt/family on 06/11/15     FL2 transmitted to all facilities within larger geographic area on       Patient informed that his/her managed care company has contracts with or will negotiate with certain facilities, including the following:        Yes   Patient/family informed of bed offers received.  [facility]     Physician recommends and patient chooses bed at      [facility].  Patient to be transferred to facility by pt daughter via private vehicle     Patient family notified on 06/12/15 of transfer.  Name of family member notified:  pt and pt daughter, Evelyn Flowers notified at bedside     PHYSICIAN Please sign FL2      Additional Comment:    _______________________________________________ Orson EvaKIDD, Giuseppe Duchemin A, LCSW 06/12/2015, 2:51 PM

## 2015-06-12 NOTE — Progress Notes (Signed)
CSW continuing to follow.   Pt medically ready for discharge today.   Handoff indicated that [facility] and [facility] with [facility] who stated that facility was not notified of pt acceptance of bed offer. [facility] offered to speak with pt daughter regarding plan. CSW received [facility] from [facility] that [facility] liaison spoke with pt daughter, Carollee HerterShannon and pt daughter is agreeable to pt discharging [facility] today.  CSW notified [employer] that plan is to [facility] today. Awaiting return phone call with authorization for discharge to [facility].   CSW to facilitate pt discharge to [facility] when [facility] authorization received.   Loletta SpecterSuzanna Khylee Algeo, MSW, LCSW Clinical Social Work 5E coverage [phone]

## 2015-06-12 NOTE — Progress Notes (Signed)
Pt discharged from the unit via wheelchair. Pt daughter transported pt to [facility] due to insurance was not going to pay for transportation to facility. Pt belongings sent home with the pt. Discharge instructions and packet sent with family member. [facility] reviewed documents with pt and family member before discharge. No questions or concerns from the pt or family members at this time.  Mitsuko Luera W Corrin Sieling, RN

## 2015-06-18 ENCOUNTER — Non-Acute Institutional Stay (SKILLED_NURSING_FACILITY): Payer: Commercial Managed Care - HMO | Admitting: Internal Medicine

## 2015-06-18 DIAGNOSIS — R531 Weakness: Secondary | ICD-10-CM | POA: Diagnosis not present

## 2015-06-18 DIAGNOSIS — E46 Unspecified protein-calorie malnutrition: Secondary | ICD-10-CM

## 2015-06-18 DIAGNOSIS — R4781 Slurred speech: Secondary | ICD-10-CM

## 2015-06-18 DIAGNOSIS — R197 Diarrhea, unspecified: Secondary | ICD-10-CM | POA: Diagnosis not present

## 2015-06-18 DIAGNOSIS — R42 Dizziness and giddiness: Secondary | ICD-10-CM

## 2015-06-18 NOTE — Progress Notes (Signed)
Patient ID: Evelyn Flowers, female   DOB: September 16, 1940, 74 y.o.   MRN: [id number]     Facility: [facility]    PCP: No primary care provider on file.  Code Status: full code  No Known Allergies  Chief Complaint  Patient presents with  . New Admit To SNF     HPI:  74 y.o. patient is here for short term rehabilitation post hospital admission from 06/08/15-06/12/15 with slurred speech. CT scan and MRI of head were negative for acute abnormalities. Stroke workup was negative. She also had diarrhea and was started on flagyl. C.diff stool was negative. She is here for rehabilitation because of weakness. She is seen in her room today. She is seen in her room today. She denies any concerns.   Review of Systems:  Constitutional: Negative for fever, chills. Positive for fatigue. HENT: Negative for headache, congestion, nasal discharge.   Eyes: Negative for blurred vision, double vision and discharge.  Respiratory: Negative for cough, shortness of breath and wheezing.   Cardiovascular: Negative for chest pain, palpitations, leg swelling.  Gastrointestinal: Negative for heartburn, nausea, vomiting, abdominal pain. Had bowel movement this am, regular and formed. Genitourinary: Negative for dysuria. Musculoskeletal: Negative for back pain, falls in the facility. Skin: Negative for itching, sores and rash.  Neurological: positive for dizziness. Psychiatric/Behavioral: Negative for depression.   No past medical history on file. No past surgical history on file. Social History:   reports that she has never smoked. She does not have any smokeless tobacco history on file. She reports that she drinks alcohol. She reports that she does not use illicit drugs.  Family History  Problem Relation Age of Onset  . Dementia Mother     Medications:   Medication List       This list is accurate as of: 06/18/15  7:21 PM.  Always use your most recent med list.                 acetaminophen 325 MG tablet  Commonly known as:  TYLENOL  Take 2 tablets (650 mg total) by mouth every 6 (six) hours as needed for mild pain (or Fever >/= 101).     aspirin 325 MG tablet  Take 325 mg by mouth every 6 (six) hours as needed for mild pain or headache.     feeding supplement (ENSURE ENLIVE) Liqd  Take 237 mLs by mouth 2 (two) times daily between meals.         Physical Exam: Filed Vitals:   06/18/15 1920  BP: 115/61  Pulse: 65  Temp: 98.1 F (36.7 C)  Resp: 18  SpO2: 94%    General- elderly female, frail and thin built, in no acute distress Head- normocephalic, atraumatic Nose- no maxillary or frontal sinus tenderness, no nasal discharge Throat- moist mucus membrane Eyes- no pallor, no icterus, no discharge, normal conjunctiva, normal sclera Neck- no cervical lymphadenopathy Cardiovascular- normal s1,s2, no murmurs, no leg edema Respiratory- bilateral clear to auscultation, no wheeze, no rhonchi, no crackles, no use of accessory muscles Abdomen- bowel sounds present, soft, non tender Musculoskeletal- able to move all 4 extremities, generalized weakness  Neurological- has slurred speech, alert and oriented to person, place and time Skin- warm and dry Psychiatry- normal mood and affect    Labs reviewed: Basic Metabolic Panel:  Recent Labs  65/78/46 1615 06/09/15 0532  NA 140 138  K 3.8 3.7  CL 100* 104  CO2 30 27  GLUCOSE 148* 115*  BUN 21* 15  CREATININE 0.89 0.60  CALCIUM 10.1 9.0  MG  --  2.0  PHOS  --  3.5   Liver Function Tests:  Recent Labs  06/08/15 1615 06/09/15 0532  AST 21 18  ALT 14 12*  ALKPHOS 110 92  BILITOT 0.8 0.6  PROT 7.7 6.1*  ALBUMIN 4.5 3.7   No results for input(s): LIPASE, AMYLASE in the last 8760 hours. No results for input(s): AMMONIA in the last 8760 hours. CBC:  Recent Labs  06/08/15 1615 06/09/15 0532  WBC 7.8 5.0  NEUTROABS 5.9  --   HGB 13.9 12.2  HCT 41.1 36.0  33.9*  MCV 91.3  92.8  PLT 220 202    Radiological Exams: Dg Chest 2 View  06/08/2015  CLINICAL DATA:  Syncope. EXAM: CHEST  2 VIEW COMPARISON:  None. FINDINGS: Heart is normal in size, mild tortuosity of the thoracic aorta. The lungs are clear, borderline hyperinflation. Pulmonary vasculature is normal. No consolidation, pleural effusion, or pneumothorax. Mild compression deformity at the thoracolumbar junction and mid thoracic spine, age indeterminate. Remote right proximal humeral fracture. Degenerative change in the left shoulder. The bones are under mineralized. IMPRESSION: No definite acute process. Compression deformities in the thoracic spine, age indeterminate. Electronically Signed   By: Rubye Oaks M.D.   On: 06/08/2015 23:22   Ct Head Wo Contrast  06/08/2015  CLINICAL DATA:  Weakness and dizziness.  Loss of weight. EXAM: CT HEAD WITHOUT CONTRAST TECHNIQUE: Contiguous axial images were obtained from the base of the skull through the vertex without intravenous contrast. COMPARISON:  None FINDINGS: There is prominence of the sulci and ventricles consistent with brain atrophy. Low attenuation throughout the periventricular and subcortical white matter is noted consistent with chronic microvascular disease. There is no abnormal extra-axial fluid collection, intracranial hemorrhage or mass. No evidence for acute brain infarct. The paranasal sinuses and mastoid air cells are clear. The calvarium is intact. IMPRESSION: 1. No acute intracranial abnormalities. 2. Chronic microvascular disease and brain atrophy. Electronically Signed   By: Signa Kell M.D.   On: 06/08/2015 19:04   Mr Brain Wo Contrast  06/09/2015  CLINICAL DATA:  Delirium and slurred speech. Syncopal episodes. Vertigo. EXAM: MRI HEAD WITHOUT CONTRAST TECHNIQUE: Multiplanar, multiecho pulse sequences of the brain and surrounding structures were obtained without intravenous contrast. COMPARISON:  CT head without contrast 06/08/2015 FINDINGS:  The diffusion-weighted images demonstrate no evidence for acute or subacute infarction. Moderate atrophy and periventricular white matter disease is present bilaterally. A prominent choroidal fissure cyst is present on the left. Dilated perivascular spaces are noted. No acute hemorrhage or mass lesion is present. The ventricles are proportionate to the degree of atrophy. The internal auditory canals are within normal limits. The brainstem and cerebellum are unremarkable. Flow is present in the major intracranial arteries. The globes and orbits are intact. The paranasal sinuses and mastoid air cells are clear. IMPRESSION: 1. No acute or focal intracranial abnormality to explain acute earlier a more vertigo. 2. Moderate age advanced atrophy and white matter disease. This likely reflects the sequela of chronic microvascular ischemia. Electronically Signed   By: Marin Roberts M.D.   On: 06/09/2015 10:47     Assessment/Plan  Generalized weakness Will have him work with physical therapy and occupational therapy team to help with gait training and muscle strengthening exercises.fall precautions. Skin care. Encourage to be out of bed.   Protein calorie malnutrition Monitor po intake. Get dietary consult. Encourage po intake  Slurred speech  To work with SLP for both speech and cognition  Diarrhea Resolved. Maintain hydration  Dizziness With position change. Monitor vital signs. Slow position change encouraged. Fall precautions.   Goals of care: short term rehabilitation   Labs/tests ordered: cbc, cmp, dietary consult  Family/ staff Communication: reviewed care plan with patient and nursing supervisor    Oneal GroutMAHIMA Camille Thau, MD  [facility] [phone] (Monday-Friday 8 am - 5 pm) [phone] (afterhours)

## 2015-06-19 ENCOUNTER — Non-Acute Institutional Stay (SKILLED_NURSING_FACILITY): Payer: Commercial Managed Care - HMO | Admitting: Nurse Practitioner

## 2015-06-19 ENCOUNTER — Encounter: Payer: Self-pay | Admitting: Nurse Practitioner

## 2015-06-19 DIAGNOSIS — R42 Dizziness and giddiness: Secondary | ICD-10-CM

## 2015-06-19 DIAGNOSIS — E43 Unspecified severe protein-calorie malnutrition: Secondary | ICD-10-CM | POA: Diagnosis not present

## 2015-06-19 DIAGNOSIS — R4781 Slurred speech: Secondary | ICD-10-CM | POA: Diagnosis not present

## 2015-06-19 DIAGNOSIS — G9340 Encephalopathy, unspecified: Secondary | ICD-10-CM

## 2015-06-19 NOTE — Progress Notes (Signed)
Patient ID: Evelyn Flowers, female   DOB: 02-Jan-1941, 74 y.o.   MRN: [id number]    Nursing Home [facility]  [facility]   Place of Service: SNF (31)  PCP: No primary care provider on file.  No Known Allergies  Chief Complaint  Patient presents with  . Discharge Note    Discharge from SNF    HPI:  Patient is a 74 y.o. female [facility] for discharge home. Pt went to the ED due to confusion, poor PO intake and weight loss with slurred speech and lightheadness. Apparently pt has had a hx of slurred speech and no deficits noted. No acute findings on CT or MRI, probable CVA in the past. Pt noted with severe protein calorie malnutrition and supplements have been added. [facility] after hospitalization. Patient currently doing well with therapy, and now stable to discharge home with home health and family support.   Review of Systems:  Review of Systems  Constitutional: Negative for activity change, appetite change, fatigue and unexpected weight change.  HENT: Negative for congestion and hearing loss.   Respiratory: Negative for cough and shortness of breath.   Cardiovascular: Negative for chest pain, palpitations and leg swelling.  Gastrointestinal: Negative for abdominal pain, diarrhea and constipation.  Genitourinary: Negative for dysuria and difficulty urinating.  Musculoskeletal: Negative for myalgias and arthralgias.  Skin: Negative for color change and wound.  Neurological: Negative for dizziness and weakness.  Psychiatric/Behavioral: Negative for behavioral problems, confusion and agitation.    Past Medical History  Diagnosis Date  . Slurred speech   . Vertigo   . Acute encephalopathy   . Lightheadedness   . Underweight   . Protein-calorie malnutrition, severe (HCC)   . Dehydration   . Diarrhea    History reviewed. No pertinent past surgical history. Social History:   reports that  she has never smoked. She does not have any smokeless tobacco history on file. She reports that she drinks alcohol. She reports that she does not use illicit drugs.  Family History  Problem Relation Age of Onset  . Dementia Mother     Medications: Patient's Medications  New Prescriptions   No medications on file  Previous Medications   ACETAMINOPHEN (TYLENOL) 325 MG TABLET    Take 2 tablets (650 mg total) by mouth every 6 (six) hours as needed for mild pain (or Fever >/= 101).   ASPIRIN 325 MG TABLET    Take 325 mg by mouth every 6 (six) hours as needed for mild pain or headache.   UNABLE TO FIND    Med Name: Med Pass 90 ml daily  Modified Medications   No medications on file  Discontinued Medications   FEEDING SUPPLEMENT, ENSURE ENLIVE, (ENSURE ENLIVE) LIQD    Take 237 mLs by mouth 2 (two) times daily between meals.     Physical Exam: Filed Vitals:   06/19/15 1434  BP: 120/65  Pulse: 76  Temp: 98.5 F (36.9 C)  TempSrc: Oral  Resp: 20  Height: 5\' 3"  (1.6 m)  Weight: 115 lb (52.164 kg)    Physical Exam  Constitutional: She is oriented to person, place, and time. She appears well-developed. No distress.  Thin elderly female, nad  HENT:  Head: Normocephalic and atraumatic.  Mouth/Throat: Oropharynx is clear and moist. No oropharyngeal exudate.  Eyes: Conjunctivae are normal. Pupils are equal, round, and reactive to light.  Neck: Normal range of  motion. Neck supple.  Cardiovascular: Normal rate, regular rhythm and normal heart sounds.   Pulmonary/Chest: Effort normal and breath sounds normal.  Abdominal: Soft. Bowel sounds are normal.  Musculoskeletal: She exhibits no edema or tenderness.  Neurological: She is alert and oriented to person, place, and time.  Skin: Skin is warm and dry. She is not diaphoretic.  Psychiatric: She has a normal mood and affect.    Labs reviewed: Basic Metabolic Panel:  Recent Labs  40/98/11 1615 06/09/15 0532  NA 140 138  K 3.8 3.7    CL 100* 104  CO2 30 27  GLUCOSE 148* 115*  BUN 21* 15  CREATININE 0.89 0.60  CALCIUM 10.1 9.0  MG  --  2.0  PHOS  --  3.5   Liver Function Tests:  Recent Labs  06/08/15 1615 06/09/15 0532  AST 21 18  ALT 14 12*  ALKPHOS 110 92  BILITOT 0.8 0.6  PROT 7.7 6.1*  ALBUMIN 4.5 3.7   No results for input(s): LIPASE, AMYLASE in the last 8760 hours. No results for input(s): AMMONIA in the last 8760 hours. CBC:  Recent Labs  06/08/15 1615 06/09/15 0532  WBC 7.8 5.0  NEUTROABS 5.9  --   HGB 13.9 12.2  HCT 41.1 36.0  33.9*  MCV 91.3 92.8  PLT 220 202   TSH:  Recent Labs  06/09/15 0532  TSH 1.782   A1C: No results found for: HGBA1C Lipid Panel: No results for input(s): CHOL, HDL, LDLCALC, TRIG, CHOLHDL, LDLDIRECT in the last 8760 hours.  Radiological Exams: Dg Chest 2 View  06/08/2015  CLINICAL DATA:  Syncope. EXAM: CHEST  2 VIEW COMPARISON:  None. FINDINGS: Heart is normal in size, mild tortuosity of the thoracic aorta. The lungs are clear, borderline hyperinflation. Pulmonary vasculature is normal. No consolidation, pleural effusion, or pneumothorax. Mild compression deformity at the thoracolumbar junction and mid thoracic spine, age indeterminate. Remote right proximal humeral fracture. Degenerative change in the left shoulder. The bones are under mineralized. IMPRESSION: No definite acute process. Compression deformities in the thoracic spine, age indeterminate. Electronically Signed   By: Rubye Oaks M.D.   On: 06/08/2015 23:22   Ct Head Wo Contrast  06/08/2015  CLINICAL DATA:  Weakness and dizziness.  Loss of weight. EXAM: CT HEAD WITHOUT CONTRAST TECHNIQUE: Contiguous axial images were obtained from the base of the skull through the vertex without intravenous contrast. COMPARISON:  None FINDINGS: There is prominence of the sulci and ventricles consistent with brain atrophy. Low attenuation throughout the periventricular and subcortical white matter is noted  consistent with chronic microvascular disease. There is no abnormal extra-axial fluid collection, intracranial hemorrhage or mass. No evidence for acute brain infarct. The paranasal sinuses and mastoid air cells are clear. The calvarium is intact. IMPRESSION: 1. No acute intracranial abnormalities. 2. Chronic microvascular disease and brain atrophy. Electronically Signed   By: Signa Kell M.D.   On: 06/08/2015 19:04   Mr Brain Wo Contrast  06/09/2015  CLINICAL DATA:  Delirium and slurred speech. Syncopal episodes. Vertigo. EXAM: MRI HEAD WITHOUT CONTRAST TECHNIQUE: Multiplanar, multiecho pulse sequences of the brain and surrounding structures were obtained without intravenous contrast. COMPARISON:  CT head without contrast 06/08/2015 FINDINGS: The diffusion-weighted images demonstrate no evidence for acute or subacute infarction. Moderate atrophy and periventricular white matter disease is present bilaterally. A prominent choroidal fissure cyst is present on the left. Dilated perivascular spaces are noted. No acute hemorrhage or mass lesion is present. The ventricles are proportionate to  the degree of atrophy. The internal auditory canals are within normal limits. The brainstem and cerebellum are unremarkable. Flow is present in the major intracranial arteries. The globes and orbits are intact. The paranasal sinuses and mastoid air cells are clear. IMPRESSION: 1. No acute or focal intracranial abnormality to explain acute earlier a more vertigo. 2. Moderate age advanced atrophy and white matter disease. This likely reflects the sequela of chronic microvascular ischemia. Electronically Signed   By: Marin Roberts M.D.   On: 06/09/2015 10:47    Assessment/Plan 1. Acute encephalopathy Acute encephalopathy has improved, concerns over long term memory loss. Mental status has been stable since at [facility].   2. Slurred speech Improved, at baseline   3. Protein-calorie malnutrition, severe  (HCC) Body mass index is 20.38 kg/(m^2). Supplements added and to cont at home. Discussed importance of proper nutrition and supplements.   4. Dizziness Improved, reports no recent vertigo.   5. Diarrhea Improved, No further diarrhea.   pt is stable for discharge- reviewed epic notes from hospital discharge, apparently per daughter pt lives in poor environment. SW at facility in touch with family regarding discharge and pt also will need PT/OT/SW/HHA per home health. SW to follow pt at home. No DME needed. Pt on no RX.  will need to follow up with PCP within 1-2 weeks.   Janene Harvey. Biagio Borg  [facility] [phone] 8 am - 5 pm) [phone] (after hours)

## 2015-06-26 DIAGNOSIS — E43 Unspecified severe protein-calorie malnutrition: Secondary | ICD-10-CM | POA: Diagnosis not present

## 2015-06-26 DIAGNOSIS — R42 Dizziness and giddiness: Secondary | ICD-10-CM | POA: Diagnosis not present

## 2015-06-26 DIAGNOSIS — Z9181 History of falling: Secondary | ICD-10-CM | POA: Diagnosis not present

## 2015-06-26 DIAGNOSIS — Z602 Problems related to living alone: Secondary | ICD-10-CM | POA: Diagnosis not present

## 2015-06-27 DIAGNOSIS — R42 Dizziness and giddiness: Secondary | ICD-10-CM | POA: Diagnosis not present

## 2015-06-27 DIAGNOSIS — Z9181 History of falling: Secondary | ICD-10-CM | POA: Diagnosis not present

## 2015-06-27 DIAGNOSIS — E43 Unspecified severe protein-calorie malnutrition: Secondary | ICD-10-CM | POA: Diagnosis not present

## 2015-06-27 DIAGNOSIS — Z602 Problems related to living alone: Secondary | ICD-10-CM | POA: Diagnosis not present

## 2015-06-28 DIAGNOSIS — Z602 Problems related to living alone: Secondary | ICD-10-CM | POA: Diagnosis not present

## 2015-06-28 DIAGNOSIS — Z9181 History of falling: Secondary | ICD-10-CM | POA: Diagnosis not present

## 2015-06-28 DIAGNOSIS — E43 Unspecified severe protein-calorie malnutrition: Secondary | ICD-10-CM | POA: Diagnosis not present

## 2015-06-28 DIAGNOSIS — R42 Dizziness and giddiness: Secondary | ICD-10-CM | POA: Diagnosis not present

## 2015-07-02 DIAGNOSIS — Z9181 History of falling: Secondary | ICD-10-CM | POA: Diagnosis not present

## 2015-07-02 DIAGNOSIS — Z602 Problems related to living alone: Secondary | ICD-10-CM | POA: Diagnosis not present

## 2015-07-02 DIAGNOSIS — R42 Dizziness and giddiness: Secondary | ICD-10-CM | POA: Diagnosis not present

## 2015-07-02 DIAGNOSIS — E43 Unspecified severe protein-calorie malnutrition: Secondary | ICD-10-CM | POA: Diagnosis not present

## 2015-07-03 DIAGNOSIS — Z9181 History of falling: Secondary | ICD-10-CM | POA: Diagnosis not present

## 2015-07-03 DIAGNOSIS — Z602 Problems related to living alone: Secondary | ICD-10-CM | POA: Diagnosis not present

## 2015-07-03 DIAGNOSIS — E43 Unspecified severe protein-calorie malnutrition: Secondary | ICD-10-CM | POA: Diagnosis not present

## 2015-07-03 DIAGNOSIS — R42 Dizziness and giddiness: Secondary | ICD-10-CM | POA: Diagnosis not present

## 2015-07-04 DIAGNOSIS — Z9181 History of falling: Secondary | ICD-10-CM | POA: Diagnosis not present

## 2015-07-04 DIAGNOSIS — Z602 Problems related to living alone: Secondary | ICD-10-CM | POA: Diagnosis not present

## 2015-07-04 DIAGNOSIS — R42 Dizziness and giddiness: Secondary | ICD-10-CM | POA: Diagnosis not present

## 2015-07-04 DIAGNOSIS — E43 Unspecified severe protein-calorie malnutrition: Secondary | ICD-10-CM | POA: Diagnosis not present

## 2015-07-05 DIAGNOSIS — R42 Dizziness and giddiness: Secondary | ICD-10-CM | POA: Diagnosis not present

## 2015-07-05 DIAGNOSIS — Z9181 History of falling: Secondary | ICD-10-CM | POA: Diagnosis not present

## 2015-07-05 DIAGNOSIS — E43 Unspecified severe protein-calorie malnutrition: Secondary | ICD-10-CM | POA: Diagnosis not present

## 2015-07-05 DIAGNOSIS — Z602 Problems related to living alone: Secondary | ICD-10-CM | POA: Diagnosis not present

## 2015-07-08 DIAGNOSIS — Z602 Problems related to living alone: Secondary | ICD-10-CM | POA: Diagnosis not present

## 2015-07-08 DIAGNOSIS — R42 Dizziness and giddiness: Secondary | ICD-10-CM | POA: Diagnosis not present

## 2015-07-08 DIAGNOSIS — Z9181 History of falling: Secondary | ICD-10-CM | POA: Diagnosis not present

## 2015-07-08 DIAGNOSIS — E43 Unspecified severe protein-calorie malnutrition: Secondary | ICD-10-CM | POA: Diagnosis not present

## 2015-07-11 DIAGNOSIS — E43 Unspecified severe protein-calorie malnutrition: Secondary | ICD-10-CM | POA: Diagnosis not present

## 2015-07-11 DIAGNOSIS — R42 Dizziness and giddiness: Secondary | ICD-10-CM | POA: Diagnosis not present

## 2015-07-11 DIAGNOSIS — Z9181 History of falling: Secondary | ICD-10-CM | POA: Diagnosis not present

## 2015-07-11 DIAGNOSIS — Z602 Problems related to living alone: Secondary | ICD-10-CM | POA: Diagnosis not present

## 2015-07-12 DIAGNOSIS — Z9181 History of falling: Secondary | ICD-10-CM | POA: Diagnosis not present

## 2015-07-12 DIAGNOSIS — Z602 Problems related to living alone: Secondary | ICD-10-CM | POA: Diagnosis not present

## 2015-07-12 DIAGNOSIS — E43 Unspecified severe protein-calorie malnutrition: Secondary | ICD-10-CM | POA: Diagnosis not present

## 2015-07-12 DIAGNOSIS — R42 Dizziness and giddiness: Secondary | ICD-10-CM | POA: Diagnosis not present

## 2015-07-15 DIAGNOSIS — R42 Dizziness and giddiness: Secondary | ICD-10-CM | POA: Diagnosis not present

## 2015-07-15 DIAGNOSIS — Z602 Problems related to living alone: Secondary | ICD-10-CM | POA: Diagnosis not present

## 2015-07-15 DIAGNOSIS — E43 Unspecified severe protein-calorie malnutrition: Secondary | ICD-10-CM | POA: Diagnosis not present

## 2015-07-15 DIAGNOSIS — Z9181 History of falling: Secondary | ICD-10-CM | POA: Diagnosis not present

## 2015-07-18 DIAGNOSIS — Z602 Problems related to living alone: Secondary | ICD-10-CM | POA: Diagnosis not present

## 2015-07-18 DIAGNOSIS — E43 Unspecified severe protein-calorie malnutrition: Secondary | ICD-10-CM | POA: Diagnosis not present

## 2015-07-18 DIAGNOSIS — R42 Dizziness and giddiness: Secondary | ICD-10-CM | POA: Diagnosis not present

## 2015-07-18 DIAGNOSIS — Z9181 History of falling: Secondary | ICD-10-CM | POA: Diagnosis not present

## 2015-07-22 DIAGNOSIS — E46 Unspecified protein-calorie malnutrition: Secondary | ICD-10-CM | POA: Diagnosis not present

## 2015-07-22 DIAGNOSIS — R4781 Slurred speech: Secondary | ICD-10-CM | POA: Diagnosis not present

## 2015-07-22 DIAGNOSIS — Z0000 Encounter for general adult medical examination without abnormal findings: Secondary | ICD-10-CM | POA: Diagnosis not present

## 2015-08-05 DIAGNOSIS — G3184 Mild cognitive impairment, so stated: Secondary | ICD-10-CM | POA: Diagnosis not present

## 2015-08-05 DIAGNOSIS — E46 Unspecified protein-calorie malnutrition: Secondary | ICD-10-CM | POA: Diagnosis not present

## 2015-09-18 ENCOUNTER — Encounter (HOSPITAL_COMMUNITY): Payer: Self-pay | Admitting: Emergency Medicine

## 2015-09-18 ENCOUNTER — Emergency Department (HOSPITAL_COMMUNITY): Payer: Commercial Managed Care - HMO

## 2015-09-18 ENCOUNTER — Inpatient Hospital Stay (HOSPITAL_COMMUNITY): Payer: Commercial Managed Care - HMO

## 2015-09-18 ENCOUNTER — Inpatient Hospital Stay (HOSPITAL_COMMUNITY)
Admission: EM | Admit: 2015-09-18 | Discharge: 2015-09-24 | DRG: 480 | Disposition: A | Discharge status: Skilled Nursing Facility | Payer: Commercial Managed Care - HMO | Attending: Internal Medicine | Admitting: Internal Medicine

## 2015-09-18 DIAGNOSIS — W010XXA Fall on same level from slipping, tripping and stumbling without subsequent striking against object, initial encounter: Secondary | ICD-10-CM | POA: Diagnosis present

## 2015-09-18 DIAGNOSIS — K5900 Constipation, unspecified: Secondary | ICD-10-CM | POA: Diagnosis present

## 2015-09-18 DIAGNOSIS — R0682 Tachypnea, not elsewhere classified: Secondary | ICD-10-CM | POA: Diagnosis not present

## 2015-09-18 DIAGNOSIS — S72141D Displaced intertrochanteric fracture of right femur, subsequent encounter for closed fracture with routine healing: Secondary | ICD-10-CM | POA: Diagnosis not present

## 2015-09-18 DIAGNOSIS — I713 Abdominal aortic aneurysm, ruptured: Secondary | ICD-10-CM | POA: Diagnosis not present

## 2015-09-18 DIAGNOSIS — Y92007 Garden or yard of unspecified non-institutional (private) residence as the place of occurrence of the external cause: Secondary | ICD-10-CM

## 2015-09-18 DIAGNOSIS — R680 Hypothermia, not associated with low environmental temperature: Secondary | ICD-10-CM | POA: Diagnosis not present

## 2015-09-18 DIAGNOSIS — Z7982 Long term (current) use of aspirin: Secondary | ICD-10-CM

## 2015-09-18 DIAGNOSIS — M545 Low back pain: Secondary | ICD-10-CM | POA: Diagnosis not present

## 2015-09-18 DIAGNOSIS — T796XXA Traumatic ischemia of muscle, initial encounter: Secondary | ICD-10-CM

## 2015-09-18 DIAGNOSIS — M6282 Rhabdomyolysis: Secondary | ICD-10-CM | POA: Diagnosis not present

## 2015-09-18 DIAGNOSIS — D696 Thrombocytopenia, unspecified: Secondary | ICD-10-CM | POA: Diagnosis not present

## 2015-09-18 DIAGNOSIS — R278 Other lack of coordination: Secondary | ICD-10-CM | POA: Diagnosis not present

## 2015-09-18 DIAGNOSIS — D649 Anemia, unspecified: Secondary | ICD-10-CM | POA: Diagnosis not present

## 2015-09-18 DIAGNOSIS — E43 Unspecified severe protein-calorie malnutrition: Secondary | ICD-10-CM | POA: Diagnosis not present

## 2015-09-18 DIAGNOSIS — R079 Chest pain, unspecified: Secondary | ICD-10-CM | POA: Diagnosis not present

## 2015-09-18 DIAGNOSIS — N390 Urinary tract infection, site not specified: Secondary | ICD-10-CM | POA: Diagnosis not present

## 2015-09-18 DIAGNOSIS — F0390 Unspecified dementia without behavioral disturbance: Secondary | ICD-10-CM | POA: Diagnosis not present

## 2015-09-18 DIAGNOSIS — R1310 Dysphagia, unspecified: Secondary | ICD-10-CM | POA: Diagnosis present

## 2015-09-18 DIAGNOSIS — S3992XA Unspecified injury of lower back, initial encounter: Secondary | ICD-10-CM | POA: Diagnosis not present

## 2015-09-18 DIAGNOSIS — S72001A Fracture of unspecified part of neck of right femur, initial encounter for closed fracture: Secondary | ICD-10-CM

## 2015-09-18 DIAGNOSIS — S72009A Fracture of unspecified part of neck of unspecified femur, initial encounter for closed fracture: Secondary | ICD-10-CM | POA: Diagnosis present

## 2015-09-18 DIAGNOSIS — M25551 Pain in right hip: Secondary | ICD-10-CM | POA: Diagnosis not present

## 2015-09-18 DIAGNOSIS — Z6820 Body mass index (BMI) 20.0-20.9, adult: Secondary | ICD-10-CM

## 2015-09-18 DIAGNOSIS — B9620 Unspecified Escherichia coli [E. coli] as the cause of diseases classified elsewhere: Secondary | ICD-10-CM | POA: Diagnosis present

## 2015-09-18 DIAGNOSIS — I248 Other forms of acute ischemic heart disease: Secondary | ICD-10-CM | POA: Diagnosis present

## 2015-09-18 DIAGNOSIS — R41841 Cognitive communication deficit: Secondary | ICD-10-CM | POA: Diagnosis not present

## 2015-09-18 DIAGNOSIS — M6281 Muscle weakness (generalized): Secondary | ICD-10-CM | POA: Diagnosis not present

## 2015-09-18 DIAGNOSIS — R778 Other specified abnormalities of plasma proteins: Secondary | ICD-10-CM

## 2015-09-18 DIAGNOSIS — Z87898 Personal history of other specified conditions: Secondary | ICD-10-CM

## 2015-09-18 DIAGNOSIS — W19XXXA Unspecified fall, initial encounter: Secondary | ICD-10-CM

## 2015-09-18 DIAGNOSIS — T796XXD Traumatic ischemia of muscle, subsequent encounter: Secondary | ICD-10-CM | POA: Diagnosis not present

## 2015-09-18 DIAGNOSIS — R0902 Hypoxemia: Secondary | ICD-10-CM | POA: Diagnosis not present

## 2015-09-18 DIAGNOSIS — S72141A Displaced intertrochanteric fracture of right femur, initial encounter for closed fracture: Secondary | ICD-10-CM | POA: Diagnosis present

## 2015-09-18 DIAGNOSIS — R7989 Other specified abnormal findings of blood chemistry: Secondary | ICD-10-CM

## 2015-09-18 DIAGNOSIS — S72144A Nondisplaced intertrochanteric fracture of right femur, initial encounter for closed fracture: Secondary | ICD-10-CM | POA: Diagnosis not present

## 2015-09-18 DIAGNOSIS — E876 Hypokalemia: Secondary | ICD-10-CM | POA: Diagnosis present

## 2015-09-18 DIAGNOSIS — Y92009 Unspecified place in unspecified non-institutional (private) residence as the place of occurrence of the external cause: Secondary | ICD-10-CM

## 2015-09-18 DIAGNOSIS — R262 Difficulty in walking, not elsewhere classified: Secondary | ICD-10-CM | POA: Diagnosis not present

## 2015-09-18 LAB — CBC WITH DIFFERENTIAL/PLATELET
Basophils Absolute: 0 10*3/uL (ref 0.0–0.1)
Basophils Relative: 0 %
Eosinophils Absolute: 0 10*3/uL (ref 0.0–0.7)
Eosinophils Relative: 0 %
HCT: 38.7 % (ref 36.0–46.0)
Hemoglobin: 13.6 g/dL (ref 12.0–15.0)
Lymphocytes Relative: 4 %
Lymphs Abs: 0.5 10*3/uL — ABNORMAL LOW (ref 0.7–4.0)
MCH: 32.1 pg (ref 26.0–34.0)
MCHC: 35.1 g/dL (ref 30.0–36.0)
MCV: 91.3 fL (ref 78.0–100.0)
Monocytes Absolute: 0.8 10*3/uL (ref 0.1–1.0)
Monocytes Relative: 7 %
Neutro Abs: 9.6 10*3/uL — ABNORMAL HIGH (ref 1.7–7.7)
Neutrophils Relative %: 89 %
Platelets: 143 10*3/uL — ABNORMAL LOW (ref 150–400)
RBC: 4.24 MIL/uL (ref 3.87–5.11)
RDW: 12.1 % (ref 11.5–15.5)
WBC: 10.9 10*3/uL — ABNORMAL HIGH (ref 4.0–10.5)

## 2015-09-18 LAB — COMPREHENSIVE METABOLIC PANEL WITH GFR
ALT: 35 U/L (ref 14–54)
AST: 74 U/L — ABNORMAL HIGH (ref 15–41)
Albumin: 4.7 g/dL (ref 3.5–5.0)
Alkaline Phosphatase: 77 U/L (ref 38–126)
Anion gap: 10 (ref 5–15)
BUN: 15 mg/dL (ref 6–20)
CO2: 27 mmol/L (ref 22–32)
Calcium: 9.4 mg/dL (ref 8.9–10.3)
Chloride: 101 mmol/L (ref 101–111)
Creatinine, Ser: 0.65 mg/dL (ref 0.44–1.00)
GFR calc Af Amer: >60 mL/min
GFR calc non Af Amer: >60 mL/min
Glucose, Bld: 110 mg/dL — ABNORMAL HIGH (ref 65–99)
Potassium: 3.2 mmol/L — ABNORMAL LOW (ref 3.5–5.1)
Sodium: 138 mmol/L (ref 135–145)
Total Bilirubin: 1.5 mg/dL — ABNORMAL HIGH (ref 0.3–1.2)
Total Protein: 7.4 g/dL (ref 6.5–8.1)

## 2015-09-18 LAB — URINALYSIS, ROUTINE W REFLEX MICROSCOPIC
Bilirubin Urine: NEGATIVE
Glucose, UA: NEGATIVE mg/dL
Ketones, ur: 40 mg/dL — AB
Nitrite: POSITIVE — AB
PH: 6 (ref 5.0–8.0)
Protein, ur: 100 mg/dL — AB
Specific Gravity, Urine: 1.015 (ref 1.005–1.030)

## 2015-09-18 LAB — CK: Total CK: 2720 U/L — ABNORMAL HIGH (ref 38–234)

## 2015-09-18 LAB — URINE MICROSCOPIC-ADD ON

## 2015-09-18 LAB — TROPONIN I: Troponin I: 0.25 ng/mL — ABNORMAL HIGH

## 2015-09-18 LAB — LACTIC ACID, PLASMA
Lactic Acid, Venous: 1.8 mmol/L (ref 0.5–2.0)
Lactic Acid, Venous: 1.2 mmol/L (ref 0.5–2.0)

## 2015-09-18 MED ORDER — DEXTROSE 5 % IV SOLN
1.0000 g | Freq: Once | INTRAVENOUS | Status: AC
  Administered 2015-09-18: 1 g via INTRAVENOUS
  Filled 2015-09-18: qty 10

## 2015-09-18 MED ORDER — HYDROCODONE-ACETAMINOPHEN 5-325 MG PO TABS
1.0000 | ORAL_TABLET | Freq: Four times a day (QID) | ORAL | Status: DC | PRN
  Administered 2015-09-19: 1 via ORAL
  Filled 2015-09-18: qty 1

## 2015-09-18 MED ORDER — POTASSIUM CHLORIDE 10 MEQ/100ML IV SOLN
10.0000 meq | INTRAVENOUS | Status: AC
Start: 2015-09-19 — End: 2015-09-19
  Administered 2015-09-19 (×3): 10 meq via INTRAVENOUS
  Filled 2015-09-18 (×3): qty 100

## 2015-09-18 MED ORDER — SODIUM CHLORIDE 0.9 % IV SOLN
INTRAVENOUS | Status: DC
  Administered 2015-09-19: 01:00:00 via INTRAVENOUS

## 2015-09-18 MED ORDER — MORPHINE SULFATE (PF) 2 MG/ML IV SOLN
0.5000 mg | INTRAVENOUS | Status: DC | PRN
  Administered 2015-09-20: 0.5 mg via INTRAVENOUS
  Filled 2015-09-18: qty 1

## 2015-09-18 MED ORDER — METHOCARBAMOL 1000 MG/10ML IJ SOLN
500.0000 mg | Freq: Four times a day (QID) | INTRAVENOUS | Status: DC | PRN
  Filled 2015-09-18: qty 5

## 2015-09-18 MED ORDER — BISACODYL 10 MG RE SUPP: 10.0000 mg | Freq: Every day | RECTAL | Status: DC | PRN

## 2015-09-18 MED ORDER — SODIUM CHLORIDE 0.9 % IV SOLN
INTRAVENOUS | Status: DC
  Administered 2015-09-18: 19:00:00 via INTRAVENOUS

## 2015-09-18 MED ORDER — METHOCARBAMOL 500 MG PO TABS
500.0000 mg | ORAL_TABLET | Freq: Four times a day (QID) | ORAL | Status: DC | PRN
Start: 2015-09-18 — End: 2015-09-24
  Administered 2015-09-23: 500 mg via ORAL
  Filled 2015-09-18: qty 1

## 2015-09-18 MED ORDER — SODIUM CHLORIDE 0.9 % IV BOLUS (SEPSIS)
1000.0000 mL | Freq: Once | INTRAVENOUS | Status: AC
  Administered 2015-09-18: 1000 mL via INTRAVENOUS

## 2015-09-18 MED ORDER — HEPARIN SODIUM (PORCINE) 5000 UNIT/ML IJ SOLN
5000.0000 [IU] | Freq: Three times a day (TID) | INTRAMUSCULAR | Status: DC
  Administered 2015-09-19 (×3): 5000 [IU] via SUBCUTANEOUS
  Filled 2015-09-18 (×5): qty 1

## 2015-09-18 MED ORDER — DONEPEZIL HCL 5 MG PO TABS
5.0000 mg | ORAL_TABLET | Freq: Every day | ORAL | Status: DC
  Administered 2015-09-19 – 2015-09-23 (×6): 5 mg via ORAL
  Filled 2015-09-18 (×7): qty 1

## 2015-09-18 NOTE — ED Provider Notes (Signed)
CSN: [id number]     Arrival date & time 09/18/15  1850 History   First MD Initiated Contact with Patient 09/18/15 1852     Chief Complaint  Patient presents with  . Fall      HPI Pt was seen at 1900. Per EMS and pt report: Pt c/o sudden onset and resolution of one episode of slip and fall that occurred approximately 1800 last night. States she slipped and fell while outside closing her shed. Pt has been laying outside since this time. Pt's neighbor went to check on her today and found her on the ground. Pt states she has been unable to stand after the fall due to right hip pain. Pt also c/o right sided LBP. Denies hitting head, no LOC, no neck pain, no CP/SOB, no abd pain, no N/V/D, no focal motor weakness, no tingling/numbness in extremities.    Past Medical History  Diagnosis Date  . Slurred speech   . Vertigo   . Acute encephalopathy   . Lightheadedness   . Underweight   . Protein-calorie malnutrition, severe (HCC)   . Dehydration   . Diarrhea    History reviewed. No pertinent past surgical history.   Family History  Problem Relation Age of Onset  . Dementia Mother    Social History  Substance Use Topics  . Smoking status: Never Smoker   . Smokeless tobacco: None  . Alcohol Use: Yes     Comment: occasionally    Review of Systems ROS: Statement: All systems negative except as marked or noted in the HPI; Constitutional: Negative for fever and chills. ; ; Eyes: Negative for eye pain, redness and discharge. ; ; ENMT: Negative for ear pain, hoarseness, nasal congestion, sinus pressure and sore throat. ; ; Cardiovascular: Negative for chest pain, palpitations, diaphoresis, dyspnea and peripheral edema. ; ; Respiratory: Negative for cough, wheezing and stridor. ; ; Gastrointestinal: Negative for nausea, vomiting, diarrhea, abdominal pain, blood in stool, hematemesis, jaundice and rectal bleeding. . ; ; Genitourinary: Negative for dysuria, flank pain and hematuria. ; ;  Musculoskeletal: +LBP, right hip pain. Negative for neck pain. Negative for swelling.; ; Skin: +sunburn. Negative for pruritus, abrasions, blisters, bruising and skin lesion.; ; Neuro: Negative for headache, lightheadedness and neck stiffness. Negative for weakness, altered level of consciousness , altered mental status, extremity weakness, paresthesias, involuntary movement, seizure and syncope.      Allergies  Review of patient's allergies indicates no known allergies.  Home Medications   Prior to Admission medications   Medication Sig Start Date End Date Taking? Authorizing Provider  acetaminophen (TYLENOL) 325 MG tablet Take 2 tablets (650 mg total) by mouth every 6 (six) hours as needed for mild pain (or Fever >/= 101). 06/11/15   Alison Murray, MD  aspirin 325 MG tablet Take 325 mg by mouth every 6 (six) hours as needed for mild pain or headache.    Historical Provider, MD  UNABLE TO FIND Med Name: Med Pass 90 ml daily    Historical Provider, MD   BP 156/107 mmHg  Pulse 82  Temp(Src) 98.4 F (36.9 C) (Oral)  Resp 18  Ht  (1.6 m)  Wt 115 lb (52.164 kg)  BMI 20.38 kg/m2  SpO2 100% Physical Exam  1905: Physical examination:  Nursing notes reviewed; Vital signs and O2 SAT reviewed;  Constitutional: Thin, frail. In no acute distress; Head:  Normocephalic, atraumatic. Sunburn to right face.; Eyes: EOMI, PERRL, No scleral icterus; ENMT: Mouth and pharynx normal, Mucous  membranes dry; Neck: Supple, Full range of motion, No lymphadenopathy; Cardiovascular: Regular rate and rhythm, No gallop; Respiratory: Breath sounds clear & equal bilaterally, No wheezes.  Speaking full sentences with ease, Normal respiratory effort/excursion; Chest: Nontender, Movement normal; Abdomen: Soft, Nontender, Nondistended, Normal bowel sounds; Genitourinary: No CVA tenderness; Spine:  No midline CS, TS, LS tenderness. +TTP right lumbar paraspinal muscles.;; Extremities: Pulses normal, Pelvis stable. +right hip  tenderness to palp. No deformity. NMS intact distal RLE. No edema, No calf edema or asymmetry.; Neuro: AA&Ox3, Major CN grossly intact.  Speech clear. No gross focal motor or sensory deficits in extremities.; Skin: Color normal, Warm, Dry.   ED Course  Procedures (including critical care time) Labs Review  Imaging Review  I have personally reviewed and evaluated these images and lab results as part of my medical decision-making.   EKG Interpretation   Date/Time:  Wednesday September 18 2015 19:41:32 EDT Ventricular Rate:  75 PR Interval:    QRS Duration: 96 QT Interval:  457 QTC Calculation: 510 R Axis:   82 Text Interpretation:  Normal sinus rhythm Borderline right axis deviation  Abnormal R-wave progression, early transition Prolonged QT interval  Artifact Poor data quality in current ECG precludes serial comparison  Confirmed by [facility]  MD, Nicholos Johns [phone]) on 09/18/2015 7:53:16 PM      MDM  MDM Reviewed: previous chart, nursing note and vitals Reviewed previous: labs and ECG Interpretation: labs, ECG and x-ray    Results for orders placed or performed during the hospital encounter of 09/18/15  CK  Result Value Ref Range   Total CK 2720 (H) 38 - 234 U/L  Urinalysis, Routine w reflex microscopic  Result Value Ref Range   Color, Urine YELLOW YELLOW   APPearance CLOUDY (A) CLEAR   Specific Gravity, Urine 1.015 1.005 - 1.030   pH 6.0 5.0 - 8.0   Glucose, UA NEGATIVE NEGATIVE mg/dL   Hgb urine dipstick MODERATE (A) NEGATIVE   Bilirubin Urine NEGATIVE NEGATIVE   Ketones, ur 40 (A) NEGATIVE mg/dL   Protein, ur 604 (A) NEGATIVE mg/dL   Nitrite POSITIVE (A) NEGATIVE   Leukocytes, UA MODERATE (A) NEGATIVE  Comprehensive metabolic panel  Result Value Ref Range   Sodium 138 135 - 145 mmol/L   Potassium 3.2 (L) 3.5 - 5.1 mmol/L   Chloride 101 101 - 111 mmol/L   CO2 27 22 - 32 mmol/L   Glucose, Bld 110 (H) 65 - 99 mg/dL   BUN 15 6 - 20 mg/dL   Creatinine, Ser 5.40 0.44 -  1.00 mg/dL   Calcium 9.4 8.9 - 98.1 mg/dL   Total Protein 7.4 6.5 - 8.1 g/dL   Albumin 4.7 3.5 - 5.0 g/dL   AST 74 (H) 15 - 41 U/L   ALT 35 14 - 54 U/L   Alkaline Phosphatase 77 38 - 126 U/L   Total Bilirubin 1.5 (H) 0.3 - 1.2 mg/dL   GFR calc non Af Amer >60 >60 mL/min   GFR calc Af Amer >60 >60 mL/min   Anion gap 10 5 - 15  Troponin I  Result Value Ref Range   Troponin I 0.25 (H) <0.031 ng/mL  Lactic acid, plasma  Result Value Ref Range   Lactic Acid, Venous 1.8 0.5 - 2.0 mmol/L  CBC with Differential  Result Value Ref Range   WBC 10.9 (H) 4.0 - 10.5 K/uL   RBC 4.24 3.87 - 5.11 MIL/uL   Hemoglobin 13.6 12.0 - 15.0 g/dL   HCT 38.7  36.0 - 46.0 %   MCV 91.3 78.0 - 100.0 fL   MCH 32.1 26.0 - 34.0 pg   MCHC 35.1 30.0 - 36.0 g/dL   RDW 16.1 09.6 - 04.5 %   Platelets 143 (L) 150 - 400 K/uL   Neutrophils Relative % 89 %   Neutro Abs 9.6 (H) 1.7 - 7.7 K/uL   Lymphocytes Relative 4 %   Lymphs Abs 0.5 (L) 0.7 - 4.0 K/uL   Monocytes Relative 7 %   Monocytes Absolute 0.8 0.1 - 1.0 K/uL   Eosinophils Relative 0 %   Eosinophils Absolute 0.0 0.0 - 0.7 K/uL   Basophils Relative 0 %   Basophils Absolute 0.0 0.0 - 0.1 K/uL  Urine microscopic-add on  Result Value Ref Range   Squamous Epithelial / LPF 0-5 (A) NONE SEEN   WBC, UA TOO NUMEROUS TO COUNT 0 - 5 WBC/hpf   RBC / HPF 0-5 0 - 5 RBC/hpf   Bacteria, UA MANY (A) NONE SEEN   Dg Chest 1 View 09/18/2015  CLINICAL DATA:  Pain following fall EXAM: CHEST 1 VIEW COMPARISON:  June 08, 2015 FINDINGS: There is slight scarring in the apices. There is no edema or consolidation. Heart size and pulmonary vascularity are normal. No adenopathy. No pneumothorax. There is evidence of old trauma involving the proximal right humerus. No acute fracture evident. IMPRESSION: No edema or consolidation.  No pneumothorax. Electronically Signed   By: Bretta Bang III M.D.   On: 09/18/2015 20:58   Dg Lumbar Spine Complete 09/18/2015  CLINICAL DATA:   Lumbago with fall EXAM: LUMBAR SPINE - COMPLETE 4+ VIEW COMPARISON:  Chest radiograph June 08, 2015 FINDINGS: Frontal, lateral, spot lumbosacral lateral, and bilateral oblique views were obtained. There are 5 non-rib-bearing lumbar type vertebral bodies. There is dextroscoliosis with a rotatory component. There is rather marked anterior wedging of the T12 vertebral body, stable. There is no other fracture. Bones are diffusely osteoporotic. There is no spondylolisthesis. There is mild disc space narrowing at L4-5 and L5-S1. There is facet osteoarthritic change at L3-4, L4-5, and L5-S1. There are scattered foci of atherosclerotic calcification in the aorta. IMPRESSION: Bones diffusely osteoporotic. Old wedge fracture at T12, stable. No acute fracture or spondylolisthesis. Scoliosis. Osteoarthritic change noted at several levels. Electronically Signed   By: Bretta Bang III M.D.   On: 09/18/2015 21:01   Dg Hip Unilat With Pelvis 2-3 Views Right 09/18/2015  CLINICAL DATA:  RIGHT hip and lower back pain, head vertigo while walking outside, fell in yard EXAM: DG HIP (WITH OR WITHOUT PELVIS) 2-3V RIGHT COMPARISON:  None FINDINGS: Osseous demineralization. Questionable lucency traversing the intertrochanteric region of the proximal are femur raising question of a nondisplaced intertrochanteric fracture. No additional fracture, dislocation or bone destruction. Visualized pelvis intact. IMPRESSION: Question nondisplaced intertrochanteric fracture of the RIGHT femur Electronically Signed   By: Ulyses Southward M.D.   On: 09/18/2015 21:01    2140:   IVF given for elevated CK. +UTI, UC pending; IV rocephin. Troponin elevated; pt denies CP. XR questionable for hip fx; will obtain CT scan. Dx and testing d/w pt and family.  Questions answered.  Verb understanding, agreeable to admit.  T/C to [facility] Dr. Sharl Ma, case discussed, including:  HPI, pertinent PM/SHx, VS/PE, dx testing, ED course and treatment:  Agreeable to admit,  requests to call Ortho MD, write temporary orders, obtain tele bed to team APAdmits.   T/C to Ortho Dr. Romeo Apple, case discussed, including:  HPI, pertinent PM/SHx,  VS/PE, dx testing, ED course and treatment:  Agreeable to consult, will not take to OR until labs improved.    Samuel JesterKathleen Lennie Vasco, DO 09/20/15 1609

## 2015-09-18 NOTE — H&P (Addendum)
PCP:   No primary care provider on file.   Chief Complaint:  Fall  HPI: 75 old female who   has a past medical history of Slurred speech; Vertigo; Acute encephalopathy; Lightheadedness; Underweight; Protein-calorie malnutrition, severe (HCC); Dehydration; and Diarrhea. Today was brought to the hospital after patient had episode of fall that occurred last night. Patient slipped and fell outside the house closing her shed. She was playing outside since that time. This morning when the patient's neighbor went to check on her they found her on ground. Patient could not get up yesterday because of right hip pain. Denies chest pain. No syncope. No shortness of breath. No nausea vomiting or diarrhea. No dysuria urgency or frequency of urination.  In the ED CT of the hip showed right femur fracture.  Allergies:  No Known Allergies    Past Medical History  Diagnosis Date  . Slurred speech   . Vertigo   . Acute encephalopathy   . Lightheadedness   . Underweight   . Protein-calorie malnutrition, severe (HCC)   . Dehydration   . Diarrhea     History reviewed. No pertinent past surgical history.  Prior to Admission medications   Medication Sig Start Date End Date Taking? Authorizing Provider  acetaminophen (TYLENOL) 325 MG tablet Take 2 tablets (650 mg total) by mouth every 6 (six) hours as needed for mild pain (or Fever >/= 101). 06/11/15   Alison Murray, MD  aspirin 325 MG tablet Take 325 mg by mouth every 6 (six) hours as needed for mild pain or headache.    Historical Provider, MD  donepezil (ARICEPT) 5 MG tablet take one tablet by mouth daily at bedtime 08/05/15   Historical Provider, MD  UNABLE TO FIND Med Name: Med Pass 90 ml daily    Historical Provider, MD    Social History:  reports that she has never smoked. She does not have any smokeless tobacco history on file. She reports that she drinks alcohol. She reports that she does not use illicit drugs.  Family History  Problem  Relation Age of Onset  . Dementia Mother     Ceasar Mons Weights   09/18/15 1854  Weight: 52.164 kg (115 lb)    All the positives are listed in BOLD  Review of Systems:  HEENT: Headache, blurred vision, runny nose, sore throat Neck: Hypothyroidism, hyperthyroidism,,lymphadenopathy Chest : Shortness of breath, history of COPD, Asthma Heart : Chest pain, history of coronary arterey disease GI:  Nausea, vomiting, diarrhea, constipation, GERD GU: Dysuria, urgency, frequency of urination, hematuria Neuro: Stroke, seizures, syncope Psych: Depression, anxiety, hallucinations   Physical Exam: Blood pressure 128/60, pulse 82, temperature 98.2 F (36.8 C), temperature source Oral, resp. rate 15, height  (1.6 m), weight 52.164 kg (115 lb), SpO2 95 %. Constitutional:   Patient is a well-developed and well-nourished female* in no acute distress and cooperative with exam. Head: Normocephalic and atraumatic Mouth: Mucus membranes moist Eyes: PERRL, EOMI, conjunctivae normal Neck: Supple, No Thyromegaly Cardiovascular: RRR, S1 normal, S2 normal Pulmonary/Chest: CTAB, no wheezes, rales, or rhonchi Abdominal: Soft. Non-tender, non-distended, bowel sounds are normal, no masses, organomegaly, or guarding present.  Neurological: A&O x3, Strength is normal and symmetric bilaterally, cranial nerve II-XII are grossly intact, no focal motor deficit, sensory intact to light touch bilaterally.  Extremities : No Cyanosis, Clubbing or Edema  Labs on Admission:  Basic Metabolic Panel:  Recent Labs Lab 09/18/15 1932  NA 138  K 3.2*  CL 101  CO2  27  GLUCOSE 110*  BUN 15  CREATININE 0.65  CALCIUM 9.4   Liver Function Tests:  Recent Labs Lab 09/18/15 1932  AST 74*  ALT 35  ALKPHOS 77  BILITOT 1.5*  PROT 7.4  ALBUMIN 4.7   No results for input(s): LIPASE, AMYLASE in the last 168 hours. No results for input(s): AMMONIA in the last 168 hours. CBC:  Recent Labs Lab 09/18/15 1932  WBC  10.9*  NEUTROABS 9.6*  HGB 13.6  HCT 38.7  MCV 91.3  PLT 143*   Cardiac Enzymes:  Recent Labs Lab 09/18/15 1932  CKTOTAL 2720*  TROPONINI 0.25*     Radiological Exams on Admission: Dg Chest 1 View  09/18/2015  CLINICAL DATA:  Pain following fall EXAM: CHEST 1 VIEW COMPARISON:  June 08, 2015 FINDINGS: There is slight scarring in the apices. There is no edema or consolidation. Heart size and pulmonary vascularity are normal. No adenopathy. No pneumothorax. There is evidence of old trauma involving the proximal right humerus. No acute fracture evident. IMPRESSION: No edema or consolidation.  No pneumothorax. Electronically Signed   By: Bretta Bang III M.D.   On: 09/18/2015 20:58   Dg Lumbar Spine Complete  09/18/2015  CLINICAL DATA:  Lumbago with fall EXAM: LUMBAR SPINE - COMPLETE 4+ VIEW COMPARISON:  Chest radiograph June 08, 2015 FINDINGS: Frontal, lateral, spot lumbosacral lateral, and bilateral oblique views were obtained. There are 5 non-rib-bearing lumbar type vertebral bodies. There is dextroscoliosis with a rotatory component. There is rather marked anterior wedging of the T12 vertebral body, stable. There is no other fracture. Bones are diffusely osteoporotic. There is no spondylolisthesis. There is mild disc space narrowing at L4-5 and L5-S1. There is facet osteoarthritic change at L3-4, L4-5, and L5-S1. There are scattered foci of atherosclerotic calcification in the aorta. IMPRESSION: Bones diffusely osteoporotic. Old wedge fracture at T12, stable. No acute fracture or spondylolisthesis. Scoliosis. Osteoarthritic change noted at several levels. Electronically Signed   By: Bretta Bang III M.D.   On: 09/18/2015 21:01   Ct Hip Right Wo Contrast  09/18/2015  CLINICAL DATA:  75 year old female with fall and right hip pain. EXAM: CT OF THE RIGHT HIP WITHOUT CONTRAST TECHNIQUE: Multidetector CT imaging of the right hip was performed according to the standard protocol.  Multiplanar CT image reconstructions were also generated. COMPARISON:  Radiograph dated 09/18/2015 FINDINGS: Evaluation for fracture is limited due to osteopenia. There is a nondisplaced intratrochanteric fracture of the right femur. There is extension of the fracture through the base of the greater trochanter. There is no dislocation. There is no joint effusion or hematoma. The soft tissues appear unremarkable. Moderate stool noted in the rectosigmoid. IMPRESSION: Osteopenia with nondisplaced intertrochanteric fracture of the right femur. Electronically Signed   By: Elgie Collard M.D.   On: 09/18/2015 23:06   Dg Hip Unilat With Pelvis 2-3 Views Right  09/18/2015  CLINICAL DATA:  RIGHT hip and lower back pain, head vertigo while walking outside, fell in yard EXAM: DG HIP (WITH OR WITHOUT PELVIS) 2-3V RIGHT COMPARISON:  None FINDINGS: Osseous demineralization. Questionable lucency traversing the intertrochanteric region of the proximal are femur raising question of a nondisplaced intertrochanteric fracture. No additional fracture, dislocation or bone destruction. Visualized pelvis intact. IMPRESSION: Question nondisplaced intertrochanteric fracture of the RIGHT femur Electronically Signed   By: Ulyses Southward M.D.   On: 09/18/2015 21:01    EKG: Independently reviewed.  Normal sinus rhythm   Assessment/Plan Active Problems:   Rhabdomyolysis   Hip  fracture (HCC)   Hypokalemia   Right hip fracture - CT of the hip showed fracture of the right femur. Orthopedics has been consulted   possible surgery in a.m.   Hypokalemia replace potassium and check BMP in a.m.  Rhabdomyolysis   patient had mild elevation of CPK 2720, also has mild elevation of troponin 0.25 likely from demand ischemia. Will cycle troponin every 6 hours 3  UTI- patient has abnormal UA, started on Rocephin. Follow urine culture results.  DVT prophylaxis Heparin  Code  status: Full code  Family discussion: No family at  bedside   Time Spent on Admission: 60 min  [facility] Center[facility],[place] [facility] Pager: [phone] 09/18/2015, 11:16 PM  If 7PM-7AM, please contact night-coverage  [email or web address]  Password [email or web address]

## 2015-09-18 NOTE — ED Notes (Signed)
[facility] Phone Number [phone] Estella HuskMichelle Turner

## 2015-09-18 NOTE — ED Notes (Signed)
EMS called out for a fall.  Pt found this evening on ground outside of home by neighbor.  Pt may have been outside since 1800 last night.  SR 70-80's  BP 148/83, Sats 100 on 3L/M [place].  CBG 123.  IV accidentally pulled out

## 2015-09-19 ENCOUNTER — Inpatient Hospital Stay (HOSPITAL_COMMUNITY): Payer: Commercial Managed Care - HMO

## 2015-09-19 DIAGNOSIS — S72141A Displaced intertrochanteric fracture of right femur, initial encounter for closed fracture: Secondary | ICD-10-CM | POA: Diagnosis present

## 2015-09-19 DIAGNOSIS — R7989 Other specified abnormal findings of blood chemistry: Secondary | ICD-10-CM

## 2015-09-19 DIAGNOSIS — R42 Dizziness and giddiness: Secondary | ICD-10-CM

## 2015-09-19 DIAGNOSIS — D696 Thrombocytopenia, unspecified: Secondary | ICD-10-CM | POA: Diagnosis present

## 2015-09-19 DIAGNOSIS — T796XXA Traumatic ischemia of muscle, initial encounter: Secondary | ICD-10-CM

## 2015-09-19 DIAGNOSIS — R778 Other specified abnormalities of plasma proteins: Secondary | ICD-10-CM | POA: Diagnosis present

## 2015-09-19 DIAGNOSIS — D649 Anemia, unspecified: Secondary | ICD-10-CM | POA: Diagnosis present

## 2015-09-19 DIAGNOSIS — N390 Urinary tract infection, site not specified: Secondary | ICD-10-CM | POA: Diagnosis present

## 2015-09-19 DIAGNOSIS — E43 Unspecified severe protein-calorie malnutrition: Secondary | ICD-10-CM

## 2015-09-19 LAB — TROPONIN I
TROPONIN I: 0.21 ng/mL — AB (ref <0.031)
TROPONIN I: 0.18 ng/mL — AB (ref <0.031)
Troponin I: 0.1 ng/mL — ABNORMAL HIGH (ref <0.031)

## 2015-09-19 LAB — ECHOCARDIOGRAM COMPLETE
HEIGHTINCHES: 63 in
WEIGHTICAEL: 1840 [oz_av]

## 2015-09-19 LAB — BASIC METABOLIC PANEL
Anion gap: 8 (ref 5–15)
BUN: 15 mg/dL (ref 6–20)
CHLORIDE: 108 mmol/L (ref 101–111)
CO2: 24 mmol/L (ref 22–32)
Calcium: 8.6 mg/dL — ABNORMAL LOW (ref 8.9–10.3)
Creatinine, Ser: 0.73 mg/dL (ref 0.44–1.00)
GFR calc non Af Amer: >60 mL/min (ref >60)
Glucose, Bld: 86 mg/dL (ref 65–99)
POTASSIUM: 3.7 mmol/L (ref 3.5–5.1)
SODIUM: 140 mmol/L (ref 135–145)

## 2015-09-19 LAB — CBC
HEMATOCRIT: 33.5 % — AB (ref 36.0–46.0)
HEMOGLOBIN: 11.5 g/dL — AB (ref 12.0–15.0)
MCH: 32 pg (ref 26.0–34.0)
MCHC: 34.3 g/dL (ref 30.0–36.0)
MCV: 93.3 fL (ref 78.0–100.0)
Platelets: 116 10*3/uL — ABNORMAL LOW (ref 150–400)
RBC: 3.59 MIL/uL — AB (ref 3.87–5.11)
RDW: 11.5 % (ref 11.5–15.5)
WBC: 8.7 10*3/uL (ref 4.0–10.5)

## 2015-09-19 LAB — CK: Total CK: 1983 U/L — ABNORMAL HIGH (ref 38–234)

## 2015-09-19 MED ORDER — POTASSIUM CHLORIDE CRYS ER 20 MEQ PO TBCR
EXTENDED_RELEASE_TABLET | ORAL | Status: AC
  Administered 2015-09-19: 10 meq via ORAL
  Filled 2015-09-19: qty 1

## 2015-09-19 MED ORDER — SENNOSIDES-DOCUSATE SODIUM 8.6-50 MG PO TABS
1.0000 | ORAL_TABLET | Freq: Two times a day (BID) | ORAL | Status: DC
  Administered 2015-09-19 – 2015-09-24 (×9): 1 via ORAL
  Filled 2015-09-19 (×13): qty 1

## 2015-09-19 MED ORDER — POTASSIUM CHLORIDE CRYS ER 20 MEQ PO TBCR
10.0000 meq | EXTENDED_RELEASE_TABLET | Freq: Every day | ORAL | Status: AC
  Administered 2015-09-19: 10 meq via ORAL

## 2015-09-19 MED ORDER — DEXTROSE 5 % IV SOLN
1.0000 g | INTRAVENOUS | Status: DC
  Administered 2015-09-19 – 2015-09-23 (×5): 1 g via INTRAVENOUS
  Filled 2015-09-19 (×6): qty 10

## 2015-09-19 MED ORDER — MAGNESIUM HYDROXIDE 400 MG/5ML PO SUSP
5.0000 mL | Freq: Once | ORAL | Status: DC
  Filled 2015-09-19: qty 30

## 2015-09-19 MED ORDER — POTASSIUM CHLORIDE IN NACL 20-0.9 MEQ/L-% IV SOLN
INTRAVENOUS | Status: DC
  Administered 2015-09-19 – 2015-09-20 (×3): via INTRAVENOUS
  Filled 2015-09-19: qty 1000

## 2015-09-19 NOTE — Progress Notes (Signed)
[facility] PROGRESS NOTE  Evelyn Flowers WUJ:[id number] DOB: [birth year] DOA: 09/18/2015 PCP: No primary care provider on file.    Code Status: Full code Family Communication: Discussed with patient; family not available Disposition Plan: Discharge when clinically appropriate; will likely need SNF placement   Consultants:  Orthopedic, Dr. Romeo Apple  Procedures:  None  Antibiotics:  Rocephin 09/18/15>>  HPI/Subjective: Patient says the right hip pain is "bearable". She denies chest pain or shortness of breath. She has some mild discomfort with urination.  Objective: Filed Vitals:   09/19/15 0845 09/19/15 0900  BP:  129/69  Pulse: 67 78  Temp:    Resp: 18 16  Oxygen saturation 94% on room air. Temperature 98.2. No intake or output data in the 24 hours ending 09/19/15 1009 Filed Weights   09/18/15 1854  Weight: 52.164 kg (115 lb)    Exam:   General: Pleasant small framed 75 year old woman in no acute distress.  Cardiovascular: S1, S2, with a soft systolic murmur  Respiratory: Clear anteriorly with decreased breath sounds in the bases  Abdomen: Positive bowel sounds, soft, nontender, nondistended.  Musculoskeletal/extremities: Moderate tenderness over the right hip; range of motion not done due to fracture; no pedal edema.  Neurologic: She is alert and oriented to herself and hospital. Cranial nerves II through XII appear to be grossly intact. Speech is clear.   Data Reviewed: Basic Metabolic Panel:  Recent Labs Lab 09/18/15 1932 09/19/15 0432  NA 138 140  K 3.2* 3.7  CL 101 108  CO2 27 24  GLUCOSE 110* 86  BUN 15 15  CREATININE 0.65 0.73  CALCIUM 9.4 8.6*   Liver Function Tests:  Recent Labs Lab 09/18/15 1932  AST 74*  ALT 35  ALKPHOS 77  BILITOT 1.5*  PROT 7.4  ALBUMIN 4.7   No results for input(s): LIPASE, AMYLASE in the last 168 hours. No results for input(s): AMMONIA in the last 168 hours. CBC:  Recent Labs Lab  09/18/15 1932 09/19/15 0432  WBC 10.9* 8.7  NEUTROABS 9.6*  --   HGB 13.6 11.5*  HCT 38.7 33.5*  MCV 91.3 93.3  PLT 143* 116*   Cardiac Enzymes:  Recent Labs Lab 09/18/15 1932 09/18/15 2200 09/19/15 0432 09/19/15 0434  CKTOTAL 2720*  --  1983*  --   TROPONINI 0.25* 0.21*  --  0.18*   BNP (last 3 results) No results for input(s): BNP in the last 8760 hours.  ProBNP (last 3 results) No results for input(s): PROBNP in the last 8760 hours.  CBG: No results for input(s): GLUCAP in the last 168 hours.  No results found for this or any previous visit (from the past 240 hour(s)).   Studies: Dg Chest 1 View  09/18/2015  CLINICAL DATA:  Pain following fall EXAM: CHEST 1 VIEW COMPARISON:  June 08, 2015 FINDINGS: There is slight scarring in the apices. There is no edema or consolidation. Heart size and pulmonary vascularity are normal. No adenopathy. No pneumothorax. There is evidence of old trauma involving the proximal right humerus. No acute fracture evident. IMPRESSION: No edema or consolidation.  No pneumothorax. Electronically Signed   By: Bretta Bang III M.D.   On: 09/18/2015 20:58   Dg Lumbar Spine Complete  09/18/2015  CLINICAL DATA:  Lumbago with fall EXAM: LUMBAR SPINE - COMPLETE 4+ VIEW COMPARISON:  Chest radiograph June 08, 2015 FINDINGS: Frontal, lateral, spot lumbosacral lateral, and bilateral oblique views were obtained. There are 5 non-rib-bearing lumbar type vertebral bodies. There is dextroscoliosis  with a rotatory component. There is rather marked anterior wedging of the T12 vertebral body, stable. There is no other fracture. Bones are diffusely osteoporotic. There is no spondylolisthesis. There is mild disc space narrowing at L4-5 and L5-S1. There is facet osteoarthritic change at L3-4, L4-5, and L5-S1. There are scattered foci of atherosclerotic calcification in the aorta. IMPRESSION: Bones diffusely osteoporotic. Old wedge fracture at T12, stable. No  acute fracture or spondylolisthesis. Scoliosis. Osteoarthritic change noted at several levels. Electronically Signed   By: Bretta Bang III M.D.   On: 09/18/2015 21:01   Ct Hip Right Wo Contrast  09/18/2015  CLINICAL DATA:  75 year old female with fall and right hip pain. EXAM: CT OF THE RIGHT HIP WITHOUT CONTRAST TECHNIQUE: Multidetector CT imaging of the right hip was performed according to the standard protocol. Multiplanar CT image reconstructions were also generated. COMPARISON:  Radiograph dated 09/18/2015 FINDINGS: Evaluation for fracture is limited due to osteopenia. There is a nondisplaced intratrochanteric fracture of the right femur. There is extension of the fracture through the base of the greater trochanter. There is no dislocation. There is no joint effusion or hematoma. The soft tissues appear unremarkable. Moderate stool noted in the rectosigmoid. IMPRESSION: Osteopenia with nondisplaced intertrochanteric fracture of the right femur. Electronically Signed   By: Elgie Collard M.D.   On: 09/18/2015 23:06   Dg Hip Unilat With Pelvis 2-3 Views Right  09/18/2015  CLINICAL DATA:  RIGHT hip and lower back pain, head vertigo while walking outside, fell in yard EXAM: DG HIP (WITH OR WITHOUT PELVIS) 2-3V RIGHT COMPARISON:  None FINDINGS: Osseous demineralization. Questionable lucency traversing the intertrochanteric region of the proximal are femur raising question of a nondisplaced intertrochanteric fracture. No additional fracture, dislocation or bone destruction. Visualized pelvis intact. IMPRESSION: Question nondisplaced intertrochanteric fracture of the RIGHT femur Electronically Signed   By: Ulyses Southward M.D.   On: 09/18/2015 21:01    Scheduled Meds: . donepezil  5 mg Oral QHS  . heparin  5,000 Units Subcutaneous 3 times per day   Continuous Infusions: . sodium chloride 100 mL/hr at 09/19/15 0032  . cefTRIAXone (ROCEPHIN)  IV    . methocarbamol (ROBAXIN)  IV     Assessment and  plan:  Principal Problem:   Closed intertrochanteric fracture of right femur (HCC) Active Problems:   Rhabdomyolysis   Elevated troponin I level   Vertigo   Protein-calorie malnutrition, severe (HCC)   UTI (lower urinary tract infection)   Normocytic anemia   Thrombocytopenia (HCC)  1. Status post fall at home, resulting in an acute right hip fracture. Patient says that he became dizzy and fell. She reports a history of vertigo. She denies loss of consciousness. In the ED, for evaluation, CT of her right hip revealed a nondisplaced intratrochanteric fracture of the right femur. -Orthopedic surgeon, Dr. Romeo Apple was consulted by the EDP Dr. Clarene Duke. Apparently, he has postponed the surgery due to the patient's abnormal laboratory studies. -We'll continue to provide supportive treatment and analgesics as needed for pain.  Mild rhabdo. Patient's total CK was 2720 on admission. She was started on IV fluids. Will continue IV fluid hydration. Elevated troponin I. Patient's troponin I was elevated at 0.25. This may be the consequence of rhabdo. She denies chest pain. -Her troponin I is trending down. -For further evaluation, will order a 2-D echocardiogram. If there is wall motion abnormality is, will consult cardiology.  Urinary tract infection. Patient's urinalysis was consistent with infection. Urine culture was ordered and  she was started on Rocephin.  Thrombocytopenia and anemia. Patient's hemoglobin was 13.6 on admission. With hydration, it has decreased to 11.5-hemodilutional decrease. Her platelet count was 143 on admission and with IV fluid hydration, it has fallen to 116. -We'll continue subcutaneous heparin with caution for DVT prophylaxis. -Her vitamin B12, TSH, and RBC folate were within normal limits 3 months ago. -Will decrease IV fluids a little. Continue to monitor.  Hypokalemia. Her serum potassium was 3.2 on admission. She was given intravenous runs of potassium  chloride. Her potassium has improved. -We'll start daily potassium chloride supplementation.  Presumed mild dementia. She was restarted on Aricept.  Constipation. There was moderate stool on the CT. Will order Senokot S twice a day.   Time spent: 35 minutes    Asc Tcg LLCFISHER,Ozzy Bohlken  [facility] Pager [phone]. If 7PM-7AM, please contact night-coverage at [email or web address], password [email or web address] 09/19/2015, 10:09 AM  LOS: 1 day

## 2015-09-19 NOTE — Progress Notes (Signed)
Pharmacy Antibiotic Note  Evelyn Flowers is a 75 y.o. female admitted on 09/18/2015 with UTI.  Pharmacy has been consulted for Ceftriaxone dosing.  Plan: Ceftriaxone 1gm IV every 24 hours  Height: 5\' 3"  (160 cm) Weight: 115 lb (52.164 kg) IBW/kg (Calculated) : 52.4  Temp (24hrs), Avg:98.3 F (36.8 C), Min:98.2 F (36.8 C), Max:98.4 F (36.9 C)   Recent Labs Lab 09/18/15 1932 09/18/15 2200 09/19/15 0432  WBC 10.9*  --  8.7  CREATININE 0.65  --  0.73  LATICACIDVEN 1.8 1.2  --     Estimated Creatinine Clearance: 49.3 mL/min (by C-G formula based on Cr of 0.73).    No Known Allergies  Antimicrobials this admission: Ceftriaxone 3/29 >>  Microbiology results: 3/29 UCx: pending    Thank you for allowing pharmacy to be a part of this patient's care. Elder CyphersLorie Decklin Weddington, BS Pharm D, BCPS Clinical Pharmacist Pager [phone] 09/19/2015 8:00 AM

## 2015-09-19 NOTE — ED Notes (Signed)
Pt states her leg just jumps every now and then.  Denies any pain.  Pt no distress.  Assessment normal.

## 2015-09-19 NOTE — Progress Notes (Addendum)
PT Cancellation Note  Patient Details Name: Evelyn HutchingMildred Fiorito V MRN: [id number] DOB: [birth year]   Cancelled Treatment:    Reason Eval/Treat Not Completed: Other (comment). Chart reviewed, RN consulted. Holding pt treatment at this time due to pending ortho consult. Pt will need a weight bearing status prior to working with PT. Pt is also currently under bedrest orders. Will attempt at later date/time.   1:24 PM, 09/19/2015 Rosamaria LintsAllan C Buccola, PT, DPT PRN [facility] License # [id number] [phone] (wireless)  [phone] (mobile)

## 2015-09-20 DIAGNOSIS — T796XXD Traumatic ischemia of muscle, subsequent encounter: Secondary | ICD-10-CM

## 2015-09-20 LAB — ABO/RH: ABO/RH(D): A POS

## 2015-09-20 LAB — BASIC METABOLIC PANEL
ANION GAP: 6 (ref 5–15)
BUN: 14 mg/dL (ref 6–20)
CHLORIDE: 109 mmol/L (ref 101–111)
CO2: 25 mmol/L (ref 22–32)
Calcium: 8.3 mg/dL — ABNORMAL LOW (ref 8.9–10.3)
Creatinine, Ser: 0.48 mg/dL (ref 0.44–1.00)
GFR calc non Af Amer: >60 mL/min (ref >60)
Glucose, Bld: 105 mg/dL — ABNORMAL HIGH (ref 65–99)
POTASSIUM: 3.8 mmol/L (ref 3.5–5.1)
SODIUM: 140 mmol/L (ref 135–145)

## 2015-09-20 LAB — CBC
HEMATOCRIT: 32.7 % — AB (ref 36.0–46.0)
HEMOGLOBIN: 11.1 g/dL — AB (ref 12.0–15.0)
MCH: 32.4 pg (ref 26.0–34.0)
MCHC: 33.9 g/dL (ref 30.0–36.0)
MCV: 95.3 fL (ref 78.0–100.0)
Platelets: 98 10*3/uL — ABNORMAL LOW (ref 150–400)
RBC: 3.43 MIL/uL — AB (ref 3.87–5.11)
RDW: 11.7 % (ref 11.5–15.5)
WBC: 5.9 10*3/uL (ref 4.0–10.5)

## 2015-09-20 LAB — TROPONIN I: TROPONIN I: 0.04 ng/mL — AB (ref <0.031)

## 2015-09-20 LAB — PREPARE RBC (CROSSMATCH)

## 2015-09-20 LAB — PROTIME-INR
INR: 1.09 (ref 0.00–1.49)
Prothrombin Time: 14.3 seconds (ref 11.6–15.2)

## 2015-09-20 LAB — CK: CK TOTAL: 771 U/L — AB (ref 38–234)

## 2015-09-20 LAB — MRSA PCR SCREENING: MRSA BY PCR: NEGATIVE

## 2015-09-20 MED ORDER — SODIUM CHLORIDE 0.9 % IV SOLN: Freq: Once | INTRAVENOUS | Status: DC

## 2015-09-20 MED ORDER — CHLORHEXIDINE GLUCONATE 4 % EX LIQD
60.0000 mL | Freq: Once | CUTANEOUS | Status: AC
  Administered 2015-09-20: 4 via TOPICAL
  Filled 2015-09-20: qty 15

## 2015-09-20 NOTE — Progress Notes (Signed)
Pharmacy Antibiotic Note  Evelyn Flowers is a 75 y.o. female admitted on 09/18/2015 with UTI.  Pharmacy has been consulted for Ceftriaxone dosing. No dosing adjustnment needed so pharmacy will sign off  Plan: Ceftriaxone 1gm IV every 24 hours  Height: 5\' 3"  (160 cm) Weight: 115 lb (52.164 kg) IBW/kg (Calculated) : 52.4  Temp (24hrs), Avg:98.4 F (36.9 C), Min:98.1 F (36.7 C), Max:98.9 F (37.2 C)   Recent Labs Lab 09/18/15 1932 09/18/15 2200 09/19/15 0432 09/20/15 0524  WBC 10.9*  --  8.7 5.9  CREATININE 0.65  --  0.73 0.48  LATICACIDVEN 1.8 1.2  --   --     Estimated Creatinine Clearance: 49.3 mL/min (by C-G formula based on Cr of 0.48).    No Known Allergies  Antimicrobials this admission: Ceftriaxone 3/29 >>  Microbiology results: 3/29 UCx: pending    Thank you for allowing pharmacy to be a part of this patient's care. Blanchie DessertLora Gibson Telleria Pharm D Clinical Pharmacist 09/20/2015 11:02 AM

## 2015-09-20 NOTE — Progress Notes (Signed)
PT Cancellation Note  Patient Details Name: Evelyn HutchingMildred Melito Flowers MRN: [id number] DOB: 11-03-1940   Cancelled Treatment:      Ortho consult detailin gplan for ORIF on 09/21/15. The pt will need new orders for PT evaluation once ready. I will complete this order.    1:13 PM, 09/20/2015 Rosamaria LintsAllan C Jazmyn Offner, PT, DPT PRN [facility] License # [id number] [phone] (wireless)  [phone] (mobile)

## 2015-09-20 NOTE — Clinical Documentation Improvement (Signed)
Internal Medicine  Under Active Problem List dated 09/19/15, Severe Protein Calorie Malnutrition is identified. In looking under Shift Assessment, this diagnosis is dated for admission of 06/09/15. It is a highly denied diagnosis if not being treated. I see no supplements ordered or evaluation performed by Nutrition.  Please clarify if above diagnosis ruled in or ruled out and document finds in next progress note. Thank you.   Other  Clinically Undetermined  Supporting Information:  Patient is 5'3" tall, weighing 115 pounds with BMI of 20.4  Please exercise your independent, professional judgment when responding. A specific answer is not anticipated or expected.  Thank You, Shellee MiloEileen T Laurelle Skiver RN, BSN, CCDS Health Information Management [place] [phone]; Cell: [phone]

## 2015-09-20 NOTE — Consult Note (Addendum)
Consult requested by Dr. Sherrie MustacheFisher for right hip fracture  The history has been confirmed as follows   Chief Complaint:   Fall  HPI: [age] old female who   has a past medical history of Slurred speech; Vertigo; Acute encephalopathy; Lightheadedness; Underweight; Protein-calorie malnutrition, severe (HCC); Dehydration; and Diarrhea. Today was brought to the hospital after patient had episode of fall that occurred last night. Patient slipped and fell outside the house closing her shed. She was playing outside since that time. This morning when the patient's neighbor went to check on her they found her on ground. Patient could not get up yesterday because of right hip pain. Denies chest pain. No syncope. No shortness of breath. No nausea vomiting or diarrhea. No dysuria urgency or frequency of urination.   In the ED CT of the hip showed right intertroch  Fracture.  ROS She reports that she has no other symptoms   Past Medical History  Diagnosis Date  . Slurred speech   . Vertigo   . Acute encephalopathy   . Lightheadedness   . Underweight   . Protein-calorie malnutrition, severe (HCC)   . Dehydration   . Diarrhea    History reviewed. No pertinent past surgical history. Family History  Problem Relation Age of Onset  . Dementia Mother    Social History  Substance Use Topics  . Smoking status: Never Smoker   . Smokeless tobacco: None  . Alcohol Use: Yes     Comment: occasionally   BP 135/69 mmHg  Pulse 68  Temp(Src) 98.3 F (36.8 C) (Oral)  Resp 18  Ht 5\' 3"  (1.6 m)  Wt 115 lb (52.164 kg)  BMI 20.38 kg/m2  SpO2 97%  CBC Latest Ref Rng 09/20/2015 09/19/2015 09/18/2015  WBC 4.0 - 10.5 K/uL 5.9 8.7 10.9(H)  Hemoglobin 12.0 - 15.0 g/dL 11.1(L) 11.5(L) 13.6  Hematocrit 36.0 - 46.0 % 32.7(L) 33.5(L) 38.7  Platelets 150 - 400 K/uL 98(L) 116(L) 143(L)   BMP Latest Ref Rng 09/20/2015 09/19/2015 09/18/2015  Glucose 65 - 99 mg/dL 629(B105(H) 86 284(X110(H)  BUN 6 - 20 mg/dL 14 15 15   Creatinine  0.44 - 1.00 mg/dL 3.240.48 4.010.73 0.270.65  Sodium 135 - 145 mmol/L 140 140 138  Potassium 3.5 - 5.1 mmol/L 3.8 3.7 3.2(L)  Chloride 101 - 111 mmol/L 109 108 101  CO2 22 - 32 mmol/L 25 24 27   Calcium 8.9 - 10.3 mg/dL 8.3(L) 8.6(L) 9.4   BP 135/69 mmHg  Pulse 68  Temp(Src) 98.3 F (36.8 C) (Oral)  Resp 18  Ht 5\' 3"  (1.6 m)  Wt 115 lb (52.164 kg)  BMI 20.38 kg/m2  SpO2 97%  Physical Exam  Constitutional: She is oriented to person, place, and time. She appears well-developed and well-nourished. No distress.  HENT:  Head: Normocephalic.  Eyes: Conjunctivae are normal. Right eye exhibits no discharge. Left eye exhibits no discharge.  Neck: Normal range of motion. Neck supple. No JVD present. No tracheal deviation present. No thyromegaly present.  Cardiovascular: Normal rate and intact distal pulses.   Pulmonary/Chest: No respiratory distress. She has no wheezes.  Abdominal: Soft. She exhibits no distension. There is no guarding.  Musculoskeletal:  upperextremities and left lower normal alingnment, rom, strength, stability  Right hip slight ext rot of leg Tender trochanter Muscle tone normal rom deferred 2nd to pain fracture Knee ankle reduced stable    Lymphadenopathy:    She has no cervical adenopathy.       Right cervical: No superficial cervical  adenopathy present.      Left cervical: No superficial cervical adenopathy present.  Neurological: She is alert and oriented to person, place, and time. No cranial nerve deficit. She exhibits normal muscle tone. Coordination normal.  Skin: Skin is warm and dry. No rash noted. She is not diaphoretic. No erythema. No pallor.  Psychiatric: She has a normal mood and affect. Her behavior is normal. Judgment and thought content normal.   xrays I see non displaced right intertrochanteric fracture   rhabdo effects improved with fluid hypok improved with replacement   Dx right intertroch fracture   Plan ORIF SAT 0800

## 2015-09-20 NOTE — Care Management Important Message (Signed)
Important Message  Patient Details  Name: Evelyn HutchingMildred Tibbits V MRN: [id number] Date of Birth: January 27, 1941   Medicare Important Message Given:  Yes    Adonis HugueninBerkhead, Annalena Piatt L, RN 09/20/2015, 8:27 AM

## 2015-09-20 NOTE — Progress Notes (Signed)
[facility] PROGRESS NOTE  Keslie Gritz VHQ:[id number] DOB: [birth year] DOA: 09/18/2015 PCP: No primary care provider on file.    Code Status: Full code Family Communication: Discussed with patient; family not available Disposition Plan: Discharge when clinically appropriate; will likely need SNF placement   Consultants:  Orthopedic, Dr. Romeo Apple  Procedures: 2-D echocardiogram 09/19/15:Study Conclusions - Left ventricle: The cavity size was normal. Wall thickness was  normal. Systolic function was normal. The estimated ejection  fraction was in the range of 60% to 65%. Wall motion was normal;  there were no regional wall motion abnormalities. Doppler  parameters are consistent with abnormal left ventricular  relaxation (grade 1 diastolic dysfunction). - Aortic valve: Mildly calcified annulus. Trileaflet.  - Mitral valve: Mildly calcified annulus.  Antibiotics:  Rocephin 09/18/15>>  HPI/Subjective: Patient denies right hip pain currently. She denies chest pain or shortness of breath. She denies rectal bleeding or black tarry stools.  Objective: Filed Vitals:   09/20/15 0552 09/20/15 1000  BP: 131/68 135/69  Pulse: 64 68  Temp: 98.1 F (36.7 C) 98.3 F (36.8 C)  Resp: 20 18  Oxygen saturation 97% on room air.  Intake/Output Summary (Last 24 hours) at 09/20/15 1050 Last data filed at 09/20/15 0837  Gross per 24 hour  Intake   1635 ml  Output      0 ml  Net   1635 ml   Filed Weights   09/18/15 1854 09/19/15 1256  Weight: 52.164 kg (115 lb) 52.164 kg (115 lb)    Exam:   General: Pleasant small framed 75 year old woman in no acute distress.  Cardiovascular: S1, S2, with a soft systolic murmur  Respiratory: Clear anteriorly with decreased breath sounds in the bases  Abdomen: Positive bowel sounds, soft, nontender, nondistended.  Musculoskeletal/extremities:Bilateral lower extremities without edema. Tenderness over the right hip with mild  palpation.  Neurologic: She is alert and oriented to herself and hospital. Cranial nerves II through XII appear to be grossly intact. Speech is clear.   Data Reviewed: Basic Metabolic Panel:  Recent Labs Lab 09/18/15 1932 09/19/15 0432 09/20/15 0524  NA 138 140 140  K 3.2* 3.7 3.8  CL 101 108 109  CO2 GLUCOSE 110* 86 105*  BUN CREATININE 0.65 0.73 0.48  CALCIUM 9.4 8.6* 8.3*   Liver Function Tests:  Recent Labs Lab 09/18/15 1932  AST 74*  ALT 35  ALKPHOS 77  BILITOT 1.5*  PROT 7.4  ALBUMIN 4.7   No results for input(s): LIPASE, AMYLASE in the last 168 hours. No results for input(s): AMMONIA in the last 168 hours. CBC:  Recent Labs Lab 09/18/15 1932 09/19/15 0432 09/20/15 0524  WBC 10.9* 8.7 5.9  NEUTROABS 9.6*  --   --   HGB 13.6 11.5* 11.1*  HCT 38.7 33.5* 32.7*  MCV 91.3 93.3 95.3  PLT 143* 116* 98*   Cardiac Enzymes:  Recent Labs Lab 09/18/15 1932 09/18/15 2200 09/19/15 0432 09/19/15 0434 09/19/15 1137 09/20/15 0524  CKTOTAL 2720*  --  1983*  --   --  771*  TROPONINI 0.25* 0.21*  --  0.18* 0.10* 0.04*   BNP (last 3 results) No results for input(s): BNP in the last 8760 hours.  ProBNP (last 3 results) No results for input(s): PROBNP in the last 8760 hours.  CBG: No results for input(s): GLUCAP in the last 168 hours.  No results found for this or any previous visit (from the past 240 hour(s)).  Studies: Dg Chest 1 View  09/18/2015  CLINICAL DATA:  Pain following fall EXAM: CHEST 1 VIEW COMPARISON:  June 08, 2015 FINDINGS: There is slight scarring in the apices. There is no edema or consolidation. Heart size and pulmonary vascularity are normal. No adenopathy. No pneumothorax. There is evidence of old trauma involving the proximal right humerus. No acute fracture evident. IMPRESSION: No edema or consolidation.  No pneumothorax. Electronically Signed   By: Bretta BangWilliam  Woodruff III M.D.   On: 09/18/2015 20:58   Dg Lumbar  Spine Complete  09/18/2015  CLINICAL DATA:  Lumbago with fall EXAM: LUMBAR SPINE - COMPLETE 4+ VIEW COMPARISON:  Chest radiograph June 08, 2015 FINDINGS: Frontal, lateral, spot lumbosacral lateral, and bilateral oblique views were obtained. There are 5 non-rib-bearing lumbar type vertebral bodies. There is dextroscoliosis with a rotatory component. There is rather marked anterior wedging of the T12 vertebral body, stable. There is no other fracture. Bones are diffusely osteoporotic. There is no spondylolisthesis. There is mild disc space narrowing at L4-5 and L5-S1. There is facet osteoarthritic change at L3-4, L4-5, and L5-S1. There are scattered foci of atherosclerotic calcification in the aorta. IMPRESSION: Bones diffusely osteoporotic. Old wedge fracture at T12, stable. No acute fracture or spondylolisthesis. Scoliosis. Osteoarthritic change noted at several levels. Electronically Signed   By: Bretta BangWilliam  Woodruff III M.D.   On: 09/18/2015 21:01   Ct Hip Right Wo Contrast  09/18/2015  CLINICAL DATA:  75 year old female with fall and right hip pain. EXAM: CT OF THE RIGHT HIP WITHOUT CONTRAST TECHNIQUE: Multidetector CT imaging of the right hip was performed according to the standard protocol. Multiplanar CT image reconstructions were also generated. COMPARISON:  Radiograph dated 09/18/2015 FINDINGS: Evaluation for fracture is limited due to osteopenia. There is a nondisplaced intratrochanteric fracture of the right femur. There is extension of the fracture through the base of the greater trochanter. There is no dislocation. There is no joint effusion or hematoma. The soft tissues appear unremarkable. Moderate stool noted in the rectosigmoid. IMPRESSION: Osteopenia with nondisplaced intertrochanteric fracture of the right femur. Electronically Signed   By: Elgie CollardArash  Radparvar M.D.   On: 09/18/2015 23:06   Dg Hip Unilat With Pelvis 2-3 Views Right  09/18/2015  CLINICAL DATA:  RIGHT hip and lower back pain,  head vertigo while walking outside, fell in yard EXAM: DG HIP (WITH OR WITHOUT PELVIS) 2-3V RIGHT COMPARISON:  None FINDINGS: Osseous demineralization. Questionable lucency traversing the intertrochanteric region of the proximal are femur raising question of a nondisplaced intertrochanteric fracture. No additional fracture, dislocation or bone destruction. Visualized pelvis intact. IMPRESSION: Question nondisplaced intertrochanteric fracture of the RIGHT femur Electronically Signed   By: Ulyses SouthwardMark  Boles M.D.   On: 09/18/2015 21:01    Scheduled Meds: . cefTRIAXone (ROCEPHIN)  IV  1 g Intravenous Q24H  . donepezil  5 mg Oral QHS  . magnesium hydroxide  5 mL Oral Once  . senna-docusate  1 tablet Oral BID   Continuous Infusions: . 0.9 % NaCl with KCl 20 mEq / L 70 mL/hr at 09/20/15 0348   Assessment and plan:  Principal Problem:   Closed intertrochanteric fracture of right femur (HCC) Active Problems:   Rhabdomyolysis   Elevated troponin I level   Vertigo   Protein-calorie malnutrition, severe (HCC)   UTI (lower urinary tract infection)   Normocytic anemia   Thrombocytopenia (HCC)  1. Status post fall at home, resulting in an acute right hip fracture. Patient said that she became dizzy and fell. She  reported a history of vertigo. She denied loss of consciousness. In the ED, for evaluation, CT of her right hip revealed a nondisplaced intratrochanteric fracture of the right femur. -Orthopedic surgeon, Dr. Romeo Apple was consulted by the EDP Dr. Clarene Duke. Apparently, he postponed the surgery due to the patient's abnormal laboratory studies. -Given that the patient's troponin I and CK have trended down and her 2-D echocardiogram revealed an EF of 60-65% and no wall motion abnormalities, she is cleared for surgery from a medical standpoint. -We'll continue to provide supportive treatment and analgesics as needed for pain.  Mild rhabdo. Patient's total CK was 2720 on admission. She was started on IV  fluids. Her total CK has progressively decreased to 771 today. Elevated troponin I. Patient's troponin I was elevated at 0.25. This may have been a consequence consequence of rhabdo and not likely ACS.Marland Kitchen She denies chest pain. -Her troponin I is trending down and has nearly normalized. -Her 2-D echocardiogram revealed an EF of 60-65% and no left ventricular regional wall motion abnormality. Incidental finding of grade 1 diastolic dysfunction. No evidence of CHF.  Urinary tract infection. Patient's urinalysis was consistent with infection. Urine culture was ordered and she was started on Rocephin.  Thrombocytopenia and anemia. Patient's hemoglobin was 13.6 on admission. With hydration, it has decreased to 11.5-hemodilutional decrease. Her platelet count was 143 on admission and with IV fluid hydration and subcutaneous heparin, it has fallen to 98. -Subcutaneous heparin was discontinued. SCDs ordered for DVT prophylaxis. -Her vitamin B12, TSH, and RBC folate were within normal limits 3 months ago. -Will decrease IV fluids a little. Continue to monitor.  Hypokalemia. Her serum potassium was 3.2 on admission. She was given intravenous runs of potassium chloride. Her potassium has improved. - Daily potassium chloride supplementation started.  Presumed mild dementia. She was restarted on Aricept.  Constipation. There was moderate stool on the CT. twice daily Senokot S ordered.   Time spent: 30 minutes    [facility]  [facility] Pager [phone]. If 7PM-7AM, please contact night-coverage at [email or web address], password [facility] 09/20/2015, 10:50 AM  LOS: 2 days

## 2015-09-21 ENCOUNTER — Inpatient Hospital Stay (HOSPITAL_COMMUNITY): Payer: Commercial Managed Care - HMO

## 2015-09-21 ENCOUNTER — Encounter (HOSPITAL_COMMUNITY)
Admission: EM | Disposition: A | Discharge status: Skilled Nursing Facility | Payer: Self-pay | Source: Home / Self Care | Attending: Internal Medicine

## 2015-09-21 ENCOUNTER — Inpatient Hospital Stay (HOSPITAL_COMMUNITY): Payer: Commercial Managed Care - HMO | Admitting: Anesthesiology

## 2015-09-21 ENCOUNTER — Encounter (HOSPITAL_COMMUNITY): Payer: Self-pay | Admitting: Anesthesiology

## 2015-09-21 DIAGNOSIS — S72141A Displaced intertrochanteric fracture of right femur, initial encounter for closed fracture: Secondary | ICD-10-CM | POA: Diagnosis present

## 2015-09-21 HISTORY — PX: COMPRESSION HIP SCREW: SHX1386

## 2015-09-21 LAB — URINE CULTURE: Culture: >=100000

## 2015-09-21 LAB — SURGICAL PCR SCREEN
MRSA, PCR: NEGATIVE
STAPHYLOCOCCUS AUREUS: NEGATIVE

## 2015-09-21 LAB — CBC
HCT: 33 % — ABNORMAL LOW (ref 36.0–46.0)
HEMOGLOBIN: 11.4 g/dL — AB (ref 12.0–15.0)
MCH: 32.3 pg (ref 26.0–34.0)
MCHC: 34.5 g/dL (ref 30.0–36.0)
MCV: 93.5 fL (ref 78.0–100.0)
PLATELETS: 106 10*3/uL — AB (ref 150–400)
RBC: 3.53 MIL/uL — AB (ref 3.87–5.11)
RDW: 12.1 % (ref 11.5–15.5)
WBC: 5.5 10*3/uL (ref 4.0–10.5)

## 2015-09-21 LAB — CK: CK TOTAL: 312 U/L — AB (ref 38–234)

## 2015-09-21 LAB — TROPONIN I

## 2015-09-21 SURGERY — COMPRESSION HIP
Anesthesia: Spinal | Site: Hip | Laterality: Right

## 2015-09-21 MED ORDER — TRAMADOL HCL 50 MG PO TABS
50.0000 mg | ORAL_TABLET | Freq: Four times a day (QID) | ORAL | Status: DC
  Administered 2015-09-21 – 2015-09-22 (×3): 50 mg via ORAL
  Filled 2015-09-21 (×3): qty 1

## 2015-09-21 MED ORDER — BUPIVACAINE-EPINEPHRINE (PF) 0.5% -1:200000 IJ SOLN
INTRAMUSCULAR | Status: AC
  Filled 2015-09-21: qty 60

## 2015-09-21 MED ORDER — ACETAMINOPHEN 10 MG/ML IV SOLN
1000.0000 mg | Freq: Once | INTRAVENOUS | Status: AC
  Administered 2015-09-21: 1000 mg via INTRAVENOUS
  Filled 2015-09-21: qty 100

## 2015-09-21 MED ORDER — TRAMADOL HCL 50 MG PO TABS
50.0000 mg | ORAL_TABLET | Freq: Once | ORAL | Status: AC
  Administered 2015-09-21: 50 mg via ORAL
  Filled 2015-09-21: qty 1

## 2015-09-21 MED ORDER — FENTANYL CITRATE (PF) 100 MCG/2ML IJ SOLN
INTRAMUSCULAR | Status: AC
  Filled 2015-09-21: qty 2

## 2015-09-21 MED ORDER — SODIUM CHLORIDE 0.9 % IJ SOLN
INTRAMUSCULAR | Status: AC
  Filled 2015-09-21: qty 10

## 2015-09-21 MED ORDER — ENSURE ENLIVE PO LIQD
237.0000 mL | Freq: Every day | ORAL | Status: DC
  Administered 2015-09-21 – 2015-09-24 (×4): 237 mL via ORAL

## 2015-09-21 MED ORDER — METOCLOPRAMIDE HCL 5 MG/ML IJ SOLN: 5.0000 mg | Freq: Three times a day (TID) | INTRAMUSCULAR | Status: DC | PRN

## 2015-09-21 MED ORDER — ONDANSETRON HCL 4 MG/2ML IJ SOLN
INTRAMUSCULAR | Status: AC
  Filled 2015-09-21: qty 2

## 2015-09-21 MED ORDER — CEFAZOLIN SODIUM-DEXTROSE 2-4 GM/100ML-% IV SOLN
INTRAVENOUS | Status: AC
  Filled 2015-09-21: qty 100

## 2015-09-21 MED ORDER — ONDANSETRON HCL 4 MG/2ML IJ SOLN
INTRAMUSCULAR | Status: DC | PRN
  Administered 2015-09-21: 4 mg via INTRAVENOUS

## 2015-09-21 MED ORDER — MIDAZOLAM HCL 2 MG/2ML IJ SOLN
INTRAMUSCULAR | Status: AC
  Filled 2015-09-21: qty 2

## 2015-09-21 MED ORDER — BUPIVACAINE IN DEXTROSE 0.75-8.25 % IT SOLN
INTRATHECAL | Status: DC | PRN
  Administered 2015-09-21: 13 mg via INTRATHECAL

## 2015-09-21 MED ORDER — ONDANSETRON HCL 4 MG PO TABS: 4.0000 mg | ORAL_TABLET | Freq: Four times a day (QID) | ORAL | Status: DC | PRN

## 2015-09-21 MED ORDER — POTASSIUM CHLORIDE IN NACL 20-0.9 MEQ/L-% IV SOLN
INTRAVENOUS | Status: DC
  Administered 2015-09-21 – 2015-09-22 (×3): via INTRAVENOUS

## 2015-09-21 MED ORDER — 0.9 % SODIUM CHLORIDE (POUR BTL) OPTIME
TOPICAL | Status: DC | PRN
Start: 2015-09-21 — End: 2015-09-21
  Administered 2015-09-21: 1000 mL

## 2015-09-21 MED ORDER — EPHEDRINE SULFATE 50 MG/ML IJ SOLN
INTRAMUSCULAR | Status: AC
  Filled 2015-09-21: qty 1

## 2015-09-21 MED ORDER — METOCLOPRAMIDE HCL 10 MG PO TABS: 5.0000 mg | ORAL_TABLET | Freq: Three times a day (TID) | ORAL | Status: DC | PRN

## 2015-09-21 MED ORDER — CEFAZOLIN SODIUM-DEXTROSE 2-3 GM-% IV SOLR
INTRAVENOUS | Status: DC | PRN
  Administered 2015-09-21: 2 g via INTRAVENOUS

## 2015-09-21 MED ORDER — ENSURE PLUS PO LIQD: 237.0000 mL | Freq: Every day | ORAL | Status: DC

## 2015-09-21 MED ORDER — FENTANYL CITRATE (PF) 100 MCG/2ML IJ SOLN
INTRAMUSCULAR | Status: DC | PRN
  Administered 2015-09-21: 25 ug via INTRAVENOUS
  Administered 2015-09-21: 25 ug via INTRATHECAL
  Administered 2015-09-21 (×2): 25 ug via INTRAVENOUS

## 2015-09-21 MED ORDER — PHENOL 1.4 % MT LIQD: 1.0000 | OROMUCOSAL | Status: DC | PRN

## 2015-09-21 MED ORDER — ARTIFICIAL TEARS OP OINT
TOPICAL_OINTMENT | OPHTHALMIC | Status: AC
  Filled 2015-09-21: qty 3.5

## 2015-09-21 MED ORDER — EPHEDRINE SULFATE 50 MG/ML IJ SOLN
INTRAMUSCULAR | Status: DC | PRN
  Administered 2015-09-21 (×2): 10 mg via INTRAVENOUS

## 2015-09-21 MED ORDER — MENTHOL 3 MG MT LOZG: 1.0000 | LOZENGE | OROMUCOSAL | Status: DC | PRN

## 2015-09-21 MED ORDER — BUPIVACAINE-EPINEPHRINE (PF) 0.5% -1:200000 IJ SOLN
INTRAMUSCULAR | Status: DC | PRN
  Administered 2015-09-21: 60 mL via PERINEURAL

## 2015-09-21 MED ORDER — PROPOFOL 10 MG/ML IV BOLUS
INTRAVENOUS | Status: AC
  Filled 2015-09-21: qty 20

## 2015-09-21 MED ORDER — ASPIRIN EC 325 MG PO TBEC
325.0000 mg | DELAYED_RELEASE_TABLET | Freq: Every day | ORAL | Status: DC
  Administered 2015-09-22 – 2015-09-24 (×3): 325 mg via ORAL
  Filled 2015-09-21 (×3): qty 1

## 2015-09-21 MED ORDER — PROPOFOL 500 MG/50ML IV EMUL
INTRAVENOUS | Status: DC | PRN
  Administered 2015-09-21: 50 ug/kg/min via INTRAVENOUS

## 2015-09-21 MED ORDER — ONDANSETRON HCL 4 MG/2ML IJ SOLN: 4.0000 mg | Freq: Four times a day (QID) | INTRAMUSCULAR | Status: DC | PRN

## 2015-09-21 MED ORDER — ACETAMINOPHEN 10 MG/ML IV SOLN
1000.0000 mg | Freq: Four times a day (QID) | INTRAVENOUS | Status: AC
  Administered 2015-09-21 – 2015-09-22 (×3): 1000 mg via INTRAVENOUS
  Filled 2015-09-21 (×3): qty 100

## 2015-09-21 MED ORDER — LACTATED RINGERS IV SOLN
INTRAVENOUS | Status: DC | PRN
  Administered 2015-09-21 (×2): via INTRAVENOUS

## 2015-09-21 MED ORDER — MIDAZOLAM HCL 5 MG/5ML IJ SOLN
INTRAMUSCULAR | Status: DC | PRN
  Administered 2015-09-21 (×2): 1 mg via INTRAVENOUS

## 2015-09-21 SURGICAL SUPPLY — 53 items
BAG HAMPER (MISCELLANEOUS) ×3 IMPLANT
BIT DRILL 6.5 DISP STRL (BIT) ×3 IMPLANT
BIT DRILL TWIST 3.5MM (BIT) ×1 IMPLANT
BLADE SURG SZ10 CARB STEEL (BLADE) ×6 IMPLANT
CLOTH BEACON ORANGE TIMEOUT ST (SAFETY) ×3 IMPLANT
COVER LIGHT HANDLE STERIS (MISCELLANEOUS) ×6 IMPLANT
COVER MAYO STAND XLG (DRAPE) ×3 IMPLANT
DRAPE STERI IOBAN 125X83 (DRAPES) ×3 IMPLANT
DRESSING MEPILEX BORDER 6X8 (GAUZE/BANDAGES/DRESSINGS) ×1 IMPLANT
DRILL OMEGA BONE (BIT) ×3 IMPLANT
DRILL TWIST 3.5MM (BIT) ×3
DRSG MEPILEX BORDER 4X12 (GAUZE/BANDAGES/DRESSINGS) IMPLANT
DRSG MEPILEX BORDER 4X8 (GAUZE/BANDAGES/DRESSINGS) ×3 IMPLANT
DRSG MEPILEX BORDER 6X8 (GAUZE/BANDAGES/DRESSINGS) ×3
ELECT REM PT RETURN 9FT ADLT (ELECTROSURGICAL) ×3
ELECTRODE REM PT RTRN 9FT ADLT (ELECTROSURGICAL) ×1 IMPLANT
GLOVE BIOGEL PI IND STRL 7.0 (GLOVE) ×1 IMPLANT
GLOVE BIOGEL PI INDICATOR 7.0 (GLOVE) ×2
GLOVE SKINSENSE NS SZ8.0 LF (GLOVE) ×2
GLOVE SKINSENSE STRL SZ8.0 LF (GLOVE) ×1 IMPLANT
GLOVE SS N UNI LF 8.5 STRL (GLOVE) ×3 IMPLANT
GOWN STRL REUS W/TWL LRG LVL3 (GOWN DISPOSABLE) ×9 IMPLANT
GOWN STRL REUS W/TWL XL LVL3 (GOWN DISPOSABLE) ×3 IMPLANT
GUIDE PIN CALIBRATED (PIN) ×3 IMPLANT
INST SET MAJOR BONE (KITS) ×3 IMPLANT
KIT BLADEGUARD II DBL (SET/KITS/TRAYS/PACK) ×3 IMPLANT
KIT ROOM TURNOVER AP CYSTO (KITS) ×3 IMPLANT
MANIFOLD NEPTUNE II (INSTRUMENTS) ×3 IMPLANT
MARKER SKIN DUAL TIP RULER LAB (MISCELLANEOUS) ×3 IMPLANT
NS IRRIG 1000ML POUR BTL (IV SOLUTION) ×3 IMPLANT
PACK BASIC III (CUSTOM PROCEDURE TRAY) ×2
PACK SRG BSC III STRL LF ECLPS (CUSTOM PROCEDURE TRAY) ×1 IMPLANT
PAD ARMBOARD 7.5X6 YLW CONV (MISCELLANEOUS) ×3 IMPLANT
PENCIL HANDSWITCHING (ELECTRODE) ×3 IMPLANT
PLATE SHTBRL 130 DEGREE 4 SLOT (Plate) ×3 IMPLANT
SCREW COMPRESSION 19.0M (Screw) ×3 IMPLANT
SCREW CORTICAL SFTP 4.5X36MM (Screw) ×6 IMPLANT
SCREW CORTICAL SFTP 4.5X38MM (Screw) ×3 IMPLANT
SCREW CORTICAL SFTP 4.5X42MM (Screw) ×3 IMPLANT
SCREW LAG 80 (Screw) ×1 IMPLANT
SCREW LAG 80MM (Screw) ×3 IMPLANT
SET BASIN LINEN APH (SET/KITS/TRAYS/PACK) ×3 IMPLANT
SLING ARM FOAM STRAP MED (SOFTGOODS) ×3 IMPLANT
SPONGE LAP 18X18 X RAY DECT (DISPOSABLE) ×6 IMPLANT
STAPLER VISISTAT 35W (STAPLE) ×3 IMPLANT
SUT BRALON NAB BRD #1 30IN (SUTURE) ×9 IMPLANT
SUT MNCRL 0 VIOLET CTX 36 (SUTURE) ×2 IMPLANT
SUT MON AB 0 CT1 (SUTURE) ×3 IMPLANT
SUT MON AB 2-0 CT1 36 (SUTURE) ×3 IMPLANT
SUT MONOCRYL 0 CTX 36 (SUTURE) ×4
SYR BULB IRRIGATION 50ML (SYRINGE) ×3 IMPLANT
TRAY FOLEY CATH SILVER 16FR LF (SET/KITS/TRAYS/PACK) ×3 IMPLANT
YANKAUER SUCT BULB TIP NO VENT (SUCTIONS) ×3 IMPLANT

## 2015-09-21 NOTE — Progress Notes (Signed)
[facility] PROGRESS NOTE  Evelyn Flowers NWG:[id number] DOB: Sep 25, 1940 DOA: 09/18/2015 PCP: No primary care provider on file.    Code Status: Full code Family Communication: Discussed with patient; family not available Disposition Plan: Discharge when clinically appropriate; will likely need SNF placement   Consultants:  Orthopedic, Dr. Romeo Apple  Procedures: 2-D echocardiogram 09/19/15:Study Conclusions - Left ventricle: The cavity size was normal. Wall thickness was  normal. Systolic function was normal. The estimated ejection  fraction was in the range of 60% to 65%. Wall motion was normal;  there were no regional wall motion abnormalities. Doppler  parameters are consistent with abnormal left ventricular  relaxation (grade 1 diastolic dysfunction). - Aortic valve: Mildly calcified annulus. Trileaflet.  - Mitral valve: Mildly calcified annulus.  Antibiotics:  Rocephin 09/18/15>>  HPI/Subjective: Patient recently arrived back from the PACU, status post right hip surgery. She denies pain.  Objective: Filed Vitals:   09/21/15 1100 09/21/15 1139  BP: 152/63 149/61  Pulse: 64 62  Temp:  97.3 F (36.3 C)  Resp: 12 14  Oxygen saturation 96% on room air.  Intake/Output Summary (Last 24 hours) at 09/21/15 1247 Last data filed at 09/21/15 1100  Gross per 24 hour  Intake 2196.83 ml  Output    600 ml  Net 1596.83 ml   Filed Weights   09/18/15 1854 09/19/15 1256  Weight: 52.164 kg (115 lb) 52.164 kg (115 lb)    Exam:   General: Pleasant small framed 75 year old woman in no acute distress.  Cardiovascular: S1, S2, with a soft systolic murmur  Respiratory: Clear anteriorly with decreased breath sounds in the bases  Abdomen: Positive bowel sounds, soft, nontender, nondistended.  Musculoskeletal/extremities:Bilateral lower extremities withoutPedal edema. Right hip postop site with bandage in place.  Neurologic: She is alert and oriented to herself  and hospital. Cranial nerves II through XII appear to be grossly intact. Speech is clear.   Data Reviewed: Basic Metabolic Panel:  Recent Labs Lab 09/18/15 1932 09/19/15 0432 09/20/15 0524  NA 138 140 140  K 3.2* 3.7 3.8  CL 101 108 109  CO2 GLUCOSE 110* 86 105*  BUN CREATININE 0.65 0.73 0.48  CALCIUM 9.4 8.6* 8.3*   Liver Function Tests:  Recent Labs Lab 09/18/15 1932  AST 74*  ALT 35  ALKPHOS 77  BILITOT 1.5*  PROT 7.4  ALBUMIN 4.7   No results for input(s): LIPASE, AMYLASE in the last 168 hours. No results for input(s): AMMONIA in the last 168 hours. CBC:  Recent Labs Lab 09/18/15 1932 09/19/15 0432 09/20/15 0524 09/21/15 0557  WBC 10.9* 8.7 5.9 5.5  NEUTROABS 9.6*  --   --   --   HGB 13.6 11.5* 11.1* 11.4*  HCT 38.7 33.5* 32.7* 33.0*  MCV 91.3 93.3 95.3 93.5  PLT 143* 116* 98* 106*   Cardiac Enzymes:  Recent Labs Lab 09/18/15 1932 09/18/15 2200 09/19/15 0432 09/19/15 0434 09/19/15 1137 09/20/15 0524 09/21/15 0557  CKTOTAL 2720*  --  1983*  --   --  771* 312*  TROPONINI 0.25* 0.21*  --  0.18* 0.10* 0.04* <0.03   BNP (last 3 results) No results for input(s): BNP in the last 8760 hours.  ProBNP (last 3 results) No results for input(s): PROBNP in the last 8760 hours.  CBG: No results for input(s): GLUCAP in the last 168 hours.  Recent Results (from the past 240 hour(s))  Urine culture     Status: None  Collection Time: 09/18/15  7:30 PM  Result Value Ref Range Status   Specimen Description URINE, CATHETERIZED  Final   Special Requests NONE  Final   Culture   Final    >=100,000 COLONIES/mL ESCHERICHIA COLI Performed at [facility]    Report Status 09/21/2015 FINAL  Final   Organism ID, Bacteria ESCHERICHIA COLI  Final      Susceptibility   Escherichia coli - MIC*    AMPICILLIN <=2 SENSITIVE Sensitive     CEFAZOLIN <=4 SENSITIVE Sensitive     CEFTRIAXONE <=1 SENSITIVE Sensitive     CIPROFLOXACIN >=4  RESISTANT Resistant     GENTAMICIN <=1 SENSITIVE Sensitive     IMIPENEM <=0.25 SENSITIVE Sensitive     NITROFURANTOIN <=16 SENSITIVE Sensitive     TRIMETH/SULFA >=320 RESISTANT Resistant     AMPICILLIN/SULBACTAM <=2 SENSITIVE Sensitive     PIP/TAZO <=4 SENSITIVE Sensitive     * >=100,000 COLONIES/mL ESCHERICHIA COLI  MRSA PCR Screening     Status: None   Collection Time: 09/20/15  6:29 PM  Result Value Ref Range Status   MRSA by PCR NEGATIVE NEGATIVE Final    Comment:        The GeneXpert MRSA Assay (FDA approved for NASAL specimens only), is one component of a comprehensive MRSA colonization surveillance program. It is not intended to diagnose MRSA infection nor to guide or monitor treatment for MRSA infections.   Surgical pcr screen     Status: None   Collection Time: 09/21/15  3:09 AM  Result Value Ref Range Status   MRSA, PCR NEGATIVE NEGATIVE Final   Staphylococcus aureus NEGATIVE NEGATIVE Final    Comment:        The Xpert SA Assay (FDA approved for NASAL specimens in patients over 221 years of age), is one component of a comprehensive surveillance program.  Test performance has been validated by [facility] for patients greater than or equal to 77 year old. It is not intended to diagnose infection nor to guide or monitor treatment.      Studies: No results found.  Scheduled Meds: . acetaminophen  1,000 mg Intravenous 4 times per day  . [START ON 09/22/2015] aspirin EC  325 mg Oral Q breakfast  . cefTRIAXone (ROCEPHIN)  IV  1 g Intravenous Q24H  . donepezil  5 mg Oral QHS  . feeding supplement (ENSURE ENLIVE)  237 mL Oral Daily  . magnesium hydroxide  5 mL Oral Once  . senna-docusate  1 tablet Oral BID  . traMADol  50 mg Oral 4 times per day   Continuous Infusions: . 0.9 % NaCl with KCl 20 mEq / L 75 mL/hr at 09/21/15 1157   Assessment and plan:  Principal Problem:   Closed intertrochanteric fracture of right femur (HCC) Active Problems:    Rhabdomyolysis   Elevated troponin I level   Vertigo   Protein-calorie malnutrition, severe (HCC)   UTI (lower urinary tract infection)   Normocytic anemia   Thrombocytopenia (HCC)   Intertrochanteric fracture of right femur (HCC)  1. Status post fall at home, resulting in an acute right hip fracture. Patient said that she became dizzy and fell. She reported a history of vertigo. She denied loss of consciousness. In the ED, for evaluation, CT of her right hip revealed a nondisplaced intratrochanteric fracture of the right femur. -Orthopedic surgeon, Dr. Romeo AppleHarrison was consulted by the EDP Dr. Clarene DukeMcManus. Per Dr. Romeo AppleHarrison, the surgery was postponed until the patient was medically cleared and  her lab results improved. Following the results of the 2-D echocardiogram which revealed an EF of 60-65% with no wall motion abnormalities and when her troponin I and CK trended downward, Dr. Romeo Apple performed open treatment/closed reduction internal fixation of the right hip fracture on 09/21/15. -We'll continue to provide supportive treatment and analgesics as needed for pain. -Recommendations with physical therapy per Dr. Romeo Apple.  Mild rhabdo. Patient's total CK was 2720 on admission. She was started on IV fluids. Her total CK has progressively decreased to 312. Elevated troponin I. Patient's troponin I was elevated at 0.25. This may have been a consequence consequence of rhabdo and not likely ACS.Marland Kitchen She denies chest pain. -Her troponin I trended down and eventually normalized to less than 0.03.  -Her 2-D echocardiogram revealed an EF of 60-65% and no left ventricular regional wall motion abnormality. Incidental finding of grade 1 diastolic dysfunction. No evidence of CHF.  Urinary tract infection secondary to Escherichia coli. Patient's urinalysis was consistent with infection. Urine culture was ordered and grew out Escherichia coli, sensitive to ceftriaxone. We'll continue ceftriaxone.  Thrombocytopenia  and anemia. Patient's hemoglobin was 13.6 on admission. With hydration, it has decreased to 11.5-hemodilutional decrease. Her platelet count was 143 on admission and with IV fluid hydration and subcutaneous heparin, it has fallen to 98. -Subcutaneous heparin was discontinued. SCDs ordered for DVT prophylaxis. -Her vitamin B12, TSH, and RBC folate were within normal limits 3 months ago. -IV fluids were decreased prior to surgery. Continue to monitor.  Hypokalemia. Her serum potassium was 3.2 on admission. She was given intravenous runs of potassium chloride. Her potassium has improved. - Daily potassium chloride supplementation started.  Presumed mild dementia. She was restarted on Aricept.  Constipation. There was moderate stool on the CT. twice daily Senokot S ordered.   Time spent: 25 minutes    [facility]  [facility] Pager [phone]. If 7PM-7AM, please contact night-coverage at [email or web address], password [facility] 09/21/2015, 12:47 PM  LOS: 3 days

## 2015-09-21 NOTE — Transfer of Care (Signed)
Immediate Anesthesia Transfer of Care Note  Patient: Evelyn Flowers  Procedure(s) Performed: Procedure(s): COMPRESSION HIP (Right)  Patient Location: PACU  Anesthesia Type:Spinal  Level of Consciousness: awake, alert , oriented and patient cooperative  Airway & Oxygen Therapy: Patient Spontanous Breathing and Patient connected to nasal cannula oxygen  Post-op Assessment: Report given to RN and Post -op Vital signs reviewed and stable  Post vital signs: Reviewed and stable  Last Vitals:  Filed Vitals:   09/21/15 0000 09/21/15 0655  BP: 137/74 134/74  Pulse: 53 68  Temp: 36.9 C 36.6 C  Resp: 20 20    Complications: No apparent anesthesia complications

## 2015-09-21 NOTE — Anesthesia Procedure Notes (Addendum)
Date/Time: 09/21/2015 8:18 AM Performed by: Pernell DupreADAMS, AMY A Pre-anesthesia Checklist: Patient identified, Timeout performed, Emergency Drugs available, Suction available and Patient being monitored Oxygen Delivery Method: Simple face mask   Spinal Patient location during procedure: OR Start time: 09/21/2015 8:33 AM Staffing Resident/CRNA: ADAMS, AMY A Preanesthetic Checklist Completed: patient identified, site marked, surgical consent, pre-op evaluation, timeout performed, IV checked, risks and benefits discussed and monitors and equipment checked Spinal Block Patient position: right lateral decubitus Prep: Betadine Patient monitoring: heart rate, cardiac monitor, continuous pulse ox and blood pressure Approach: right paramedian Location: L3-4 Injection technique: single-shot Needle Needle type: Spinocan  Needle gauge: 22 G Needle length: 9 cm Assessment Sensory level: T8 Additional Notes ATTEMPTS:1 TRAY ZO:[id number] TRAY EXPIRATION DATE:05/2016

## 2015-09-21 NOTE — Brief Op Note (Signed)
09/18/2015 - 09/21/2015  10:07 AM  PATIENT:  Evelyn HutchingMildred Frosch Flowers  75 y.o. female  PRE-OPERATIVE DIAGNOSIS:  right hip fracture intertrochanteric  POST-OPERATIVE DIAGNOSIS:  right hip fracture intertrochanteric  PROCEDURE:  Procedure(s):open treatment (closed reduction) internal fixation right  COMPRESSION HIP (Right) - richards hip screw: 80 mm lag screw, compression screw, 4 hole short barrel plate, 4 screws   SURGEON:  Surgeon(s) and Role:    * Vickki HearingStanley E Harrison, MD - Primary  PHYSICIAN ASSISTANT:   ASSISTANTS: none   ANESTHESIA:   spinal  EBL:  Total I/O In: 1250 [I.Flowers.:1250] Out: 350 [Urine:300; Blood:50]  BLOOD ADMINISTERED:none  DRAINS: none   LOCAL MEDICATIONS USED:  MARCAINE with epi    and Amount: 60 ml  SPECIMEN:  No Specimen  DISPOSITION OF SPECIMEN:  N/A  COUNTS:  YES  TOURNIQUET:  * No tourniquets in log *  DICTATION: .Dragon Dictation  PLAN OF CARE: Admit to inpatient   PATIENT DISPOSITION:  PACU - hemodynamically stable.   Delay start of Pharmacological VTE agent (>24hrs) due to surgical blood loss or risk of bleeding: yes

## 2015-09-21 NOTE — Anesthesia Postprocedure Evaluation (Signed)
Anesthesia Post Note  Patient: Evelyn HutchingMildred Bingman V  Procedure(s) Performed: Procedure(s) (LRB): COMPRESSION HIP (Right)  Patient location during evaluation: PACU Anesthesia Type: Spinal Level of consciousness: awake and alert Pain management: pain level controlled Vital Signs Assessment: post-procedure vital signs reviewed and stable Respiratory status: spontaneous breathing and patient connected to nasal cannula oxygen Cardiovascular status: stable Postop Assessment: no signs of nausea or vomiting Anesthetic complications: no    Last Vitals:  Filed Vitals:   09/21/15 0000 09/21/15 0655  BP: 137/74 134/74  Pulse: 53 68  Temp: 36.9 C 36.6 C  Resp: 20 20    Last Pain:  Filed Vitals:   09/21/15 0657  PainSc: 0-No pain                 ADAMS, AMY A

## 2015-09-21 NOTE — Anesthesia Preprocedure Evaluation (Addendum)
Anesthesia Evaluation  Patient identified by MRN, date of birth, ID band Patient awake    Reviewed: Allergy & Precautions, H&P , NPO status , Patient's Chart, lab work & pertinent test results  Airway Mallampati: II  TM Distance: >3 FB Neck ROM: full    Dental  (+) Teeth Intact   Pulmonary    breath sounds clear to auscultation       Cardiovascular  Rhythm:regular Rate:Normal  2D Echo on 08-23-2015 showed EF 60-65%; Hx of vertigo   Neuro/Psych PSYCHIATRIC DISORDERS Hx of acute encephalopathyMild rhabdomyolysis on admission; resolving;  Vertigo;     GI/Hepatic   Endo/Other    Renal/GU      Musculoskeletal   Abdominal   Peds  Hematology Platelets 98K   Anesthesia Other Findings   Reproductive/Obstetrics                            Anesthesia Physical Anesthesia Plan  ASA: II  Anesthesia Plan: Spinal   Post-op Pain Management:    Induction:   Airway Management Planned: Simple Face Mask  Additional Equipment:   Intra-op Plan:   Post-operative Plan:   Informed Consent: I have reviewed the patients History and Physical, chart, labs and discussed the procedure including the risks, benefits and alternatives for the proposed anesthesia with the patient or authorized representative who has indicated his/her understanding and acceptance.   Dental Advisory Given  Plan Discussed with: Anesthesiologist and Surgeon  Anesthesia Plan Comments:        Anesthesia Quick Evaluation

## 2015-09-21 NOTE — Op Note (Signed)
09/18/2015 - 09/21/2015  10:07 AM  PATIENT:  Evelyn HutchingMildred Riedesel Flowers  75 y.o. female  PRE-OPERATIVE DIAGNOSIS:  right hip fracture intertrochanteric  POST-OPERATIVE DIAGNOSIS:  right hip fracture intertrochanteric  PROCEDURE:  Procedure(s):open treatment (closed reduction) internal fixation right  COMPRESSION HIP (Right) - richards hip screw: 80 mm lag screw, compression screw, 4 hole short barrel plate, 4 screws  FINDINGS:  2 part closed intertrochanteric hip fracture   The surgery was done as follows: The patient was examined in the preoperative area. Surgical site was confirmed and the surgical site was marked. Chart review was completed.  The patient was taken to surgery spinal anesthetic was placed successfully without complication. A Foley catheter was inserted sterilely.  The patient was placed on the fracture table. The right leg was placed in a traction device left leg was padded and placed in a abduction external rotation holder. The right arm was secured to the chest.  The C-arm was brought in and x-rays were obtained and with slight internal rotation the fracture was reduced. Reduction was confirmed with AP and lateral C-arm x-rays  The right leg was then prepped and draped sterilely and the timeout was completed  The incision was made starting at the greater trochanter and carried distally. The subtenon's tissue was divided down to the fascia. The fascia was incised in line with the skin incision. The vastus lateralis musculature was incised and bleeders were coagulated as they were encountered.  135 guide was used to place a pin in the center the femoral head on both the AP and lateral x-ray. Once this was confirmed the guidepin was measured. It measured 80 mm. The reamer was set for 80 mm and passed over the guidewire.  A 130 short barrel plate with lag screw were placed starting with the lag screw and then securing the plate to the femoral shaft using AO technique and 4.5 mm  screws.  We released the traction and placed a compression screw  Our final x-rays showed a reduced fracture with implants in good position  The wound was irrigated and closed in layered fashion with 0 Monocryl suture. We injected each layer with 30 mL of Marcaine with epinephrine.  We placed a sterile dressing  The patient was moved securely onto the bed and taken to recovery room in stable condition  The following postop plan Weightbearing status as tolerated Hip precautions none DVT prevention aspirin for 28 days Staples removal at postop day 12 First follow-up postop day 14 with x-ray X-ray at 6 weeks X-ray at 12 weeks  SURGEON:  Surgeon(s) and Role:    * Vickki HearingStanley E Britiny Defrain, MD - Primary  PHYSICIAN ASSISTANT:   ASSISTANTS: none   ANESTHESIA:   spinal  EBL:  Total I/O In: 1250 [I.Flowers.:1250] Out: 350 [Urine:300; Blood:50]  BLOOD ADMINISTERED:none  DRAINS: none   LOCAL MEDICATIONS USED:  MARCAINE with epi    and Amount: 60 ml  SPECIMEN:  No Specimen  DISPOSITION OF SPECIMEN:  N/A  COUNTS:  YES  TOURNIQUET:  * No tourniquets in log *  DICTATION: .Dragon Dictation  PLAN OF CARE: Admit to inpatient   PATIENT DISPOSITION:  PACU - hemodynamically stable.   Delay start of Pharmacological VTE agent (>24hrs) due to surgical blood loss or risk of bleeding: yes

## 2015-09-22 DIAGNOSIS — M6282 Rhabdomyolysis: Secondary | ICD-10-CM

## 2015-09-22 LAB — CBC
HCT: 32.3 % — ABNORMAL LOW (ref 36.0–46.0)
HEMOGLOBIN: 11 g/dL — AB (ref 12.0–15.0)
MCH: 31.9 pg (ref 26.0–34.0)
MCHC: 34.1 g/dL (ref 30.0–36.0)
MCV: 93.6 fL (ref 78.0–100.0)
Platelets: 124 10*3/uL — ABNORMAL LOW (ref 150–400)
RBC: 3.45 MIL/uL — ABNORMAL LOW (ref 3.87–5.11)
RDW: 12 % (ref 11.5–15.5)
WBC: 6.9 10*3/uL (ref 4.0–10.5)

## 2015-09-22 LAB — BASIC METABOLIC PANEL
Anion gap: 6 (ref 5–15)
BUN: 7 mg/dL (ref 6–20)
CO2: 27 mmol/L (ref 22–32)
Calcium: 8.5 mg/dL — ABNORMAL LOW (ref 8.9–10.3)
Chloride: 102 mmol/L (ref 101–111)
Creatinine, Ser: 0.52 mg/dL (ref 0.44–1.00)
Glucose, Bld: 132 mg/dL — ABNORMAL HIGH (ref 65–99)
POTASSIUM: 4.2 mmol/L (ref 3.5–5.1)
SODIUM: 135 mmol/L (ref 135–145)

## 2015-09-22 LAB — CK: CK TOTAL: 668 U/L — AB (ref 38–234)

## 2015-09-22 MED ORDER — HALOPERIDOL 0.5 MG PO TABS
0.5000 mg | ORAL_TABLET | Freq: Four times a day (QID) | ORAL | Status: DC | PRN
Start: 2015-09-22 — End: 2015-09-24
  Filled 2015-09-22: qty 1

## 2015-09-22 MED ORDER — TRAMADOL HCL 50 MG PO TABS
50.0000 mg | ORAL_TABLET | Freq: Four times a day (QID) | ORAL | Status: DC | PRN
  Administered 2015-09-23: 50 mg via ORAL
  Filled 2015-09-22: qty 1

## 2015-09-22 MED ORDER — ACETAMINOPHEN 500 MG PO TABS: 500.0000 mg | ORAL_TABLET | ORAL | Status: DC | PRN

## 2015-09-22 NOTE — Addendum Note (Signed)
Addendum  created 09/22/15 1613 by Earleen NewportAmy A Adams, CRNA   Modules edited: Clinical Notes   Clinical Notes:  File: [id number]

## 2015-09-22 NOTE — Progress Notes (Signed)
PT Cancellation Note  Patient Details Name: Evelyn HutchingMildred Clippinger V MRN: [id number] DOB: 1941-04-21   Cancelled Treatment/Patient Not Seen: Made second attempt to see patient this morning, however patient actively eating lunch. Will make patient a priority for evaluation on 4/3.   Nedra HaiKristen Michelena Culmer PT, DPT [phone]

## 2015-09-22 NOTE — Progress Notes (Signed)
[facility] PROGRESS NOTE  Evelyn Flowers DOB: Mar 29, 1941 DOA: 09/18/2015 PCP: No primary care provider on file.    Code Status: Full code Family Communication: Discussed with patient; family not available Disposition Plan: Discharge when clinically appropriate; will likely need SNF placement   Consultants:  Orthopedic, Dr. Romeo AppleHarrison  Procedures: 2-D echocardiogram 09/19/15:Study Conclusions - Left ventricle: The cavity size was normal. Wall thickness was  normal. Systolic function was normal. The estimated ejection  fraction was in the range of 60% to 65%. Wall motion was normal;  there were no regional wall motion abnormalities. Doppler  parameters are consistent with abnormal left ventricular  relaxation (grade 1 diastolic dysfunction). - Aortic valve: Mildly calcified annulus. Trileaflet.  - Mitral valve: Mildly calcified annulus.  Antibiotics:  Rocephin 09/18/15>>  HPI/Subjective: Nursing reports patient's confusion this morning. Patient denies right hip pain, but said it did hurt when she tried to get up with a therapist.  Objective: Filed Vitals:   09/22/15 0245 09/22/15 0645  BP: 147/66 146/83  Pulse: 71 80  Temp: 98.6 F (37 C) 98.6 F (37 C)  Resp: 14 14  Oxygen saturation 95% on room air.  Intake/Output Summary (Last 24 hours) at 09/22/15 1208 Last data filed at 09/22/15 1124  Gross per 24 hour  Intake 1286.25 ml  Output   1850 ml  Net -563.75 ml   Filed Weights   09/18/15 1854 09/19/15 1256  Weight: 52.164 kg (115 lb) 52.164 kg (115 lb)    Exam:   General: Pleasant small framed 75 year old woman in no acute distress.  Cardiovascular: S1, S2, with a soft systolic murmur  Respiratory: Clear anteriorly with decreased breath sounds in the bases  Abdomen: Positive bowel sounds, soft, nontender, nondistended.  Musculoskeletal/extremities:Bilateral lower extremities withoutPedal edema. Right hip postop site with  bandage in place.  Neurologic: She is alert and oriented to herself, but could not recall the name of the hospital. She seems a little more confused this morning. Cranial nerves II through XII appear to be grossly intact. Speech is clear.   Data Reviewed: Basic Metabolic Panel:  Recent Labs Lab 09/18/15 1932 09/19/15 0432 09/20/15 0524 09/22/15 0557  NA 138 140 140 135  K 3.2* 3.7 3.8 4.2  CL 101 108 109 102  CO2 27 24 25 27   GLUCOSE 110* 86 105* 132*  BUN 15 15 14 7   CREATININE 0.65 0.73 0.48 0.52  CALCIUM 9.4 8.6* 8.3* 8.5*   Liver Function Tests:  Recent Labs Lab 09/18/15 1932  AST 74*  ALT 35  ALKPHOS 77  BILITOT 1.5*  PROT 7.4  ALBUMIN 4.7   No results for input(s): LIPASE, AMYLASE in the last 168 hours. No results for input(s): AMMONIA in the last 168 hours. CBC:  Recent Labs Lab 09/18/15 1932 09/19/15 0432 09/20/15 0524 09/21/15 0557 09/22/15 0557  WBC 10.9* 8.7 5.9 5.5 6.9  NEUTROABS 9.6*  --   --   --   --   HGB 13.6 11.5* 11.1* 11.4* 11.0*  HCT 38.7 33.5* 32.7* 33.0* 32.3*  MCV 91.3 93.3 95.3 93.5 93.6  PLT 143* 116* 98* 106* 124*   Cardiac Enzymes:  Recent Labs Lab 09/18/15 1932 09/18/15 2200 09/19/15 0432 09/19/15 0434 09/19/15 1137 09/20/15 0524 09/21/15 0557 09/22/15 0557  CKTOTAL 2720*  --  1983*  --   --  771* 312* 668*  TROPONINI 0.25* 0.21*  --  0.18* 0.10* 0.04* <0.03  --    BNP (last 3 results) No results for  input(s): BNP in the last 8760 hours.  ProBNP (last 3 results) No results for input(s): PROBNP in the last 8760 hours.  CBG: No results for input(s): GLUCAP in the last 168 hours.  Recent Results (from the past 240 hour(s))  Urine culture     Status: None   Collection Time: 09/18/15  7:30 PM  Result Value Ref Range Status   Specimen Description URINE, CATHETERIZED  Final   Special Requests NONE  Final   Culture   Final    >=100,000 COLONIES/mL ESCHERICHIA COLI Performed at [facility]    Report  Status 09/21/2015 FINAL  Final   Organism ID, Bacteria ESCHERICHIA COLI  Final      Susceptibility   Escherichia coli - MIC*    AMPICILLIN <=2 SENSITIVE Sensitive     CEFAZOLIN <=4 SENSITIVE Sensitive     CEFTRIAXONE <=1 SENSITIVE Sensitive     CIPROFLOXACIN >=4 RESISTANT Resistant     GENTAMICIN <=1 SENSITIVE Sensitive     IMIPENEM <=0.25 SENSITIVE Sensitive     NITROFURANTOIN <=16 SENSITIVE Sensitive     TRIMETH/SULFA >=320 RESISTANT Resistant     AMPICILLIN/SULBACTAM <=2 SENSITIVE Sensitive     PIP/TAZO <=4 SENSITIVE Sensitive     * >=100,000 COLONIES/mL ESCHERICHIA COLI  MRSA PCR Screening     Status: None   Collection Time: 09/20/15  6:29 PM  Result Value Ref Range Status   MRSA by PCR NEGATIVE NEGATIVE Final    Comment:        The GeneXpert MRSA Assay (FDA approved for NASAL specimens only), is one component of a comprehensive MRSA colonization surveillance program. It is not intended to diagnose MRSA infection nor to guide or monitor treatment for MRSA infections.   Surgical pcr screen     Status: None   Collection Time: 09/21/15  3:09 AM  Result Value Ref Range Status   MRSA, PCR NEGATIVE NEGATIVE Final   Staphylococcus aureus NEGATIVE NEGATIVE Final    Comment:        The Xpert SA Assay (FDA approved for NASAL specimens in patients over 69 years of age), is one component of a comprehensive surveillance program.  Test performance has been validated by [facility] for patients greater than or equal to 18 year old. It is not intended to diagnose infection nor to guide or monitor treatment.      Studies: Dg Hip Operative Unilat With Pelvis Right  09/21/2015  CLINICAL DATA:  Intertrochanteric fracture RIGHT femur, ORIF EXAM: OPERATIVE RIGHT HIP (WITH PELVIS IF PERFORMED) 7 VIEWS TECHNIQUE: Fluoroscopic spot image(s) were submitted for interpretation post-operatively. COMPARISON:  CT RIGHT hip 09/18/2015, radiographs 09/18/2015 FLUOROSCOPY TIME:  1 minutes 14  seconds Images obtained:  7 FINDINGS: Images demonstrate diffuse osseous demineralization. A lateral plate with multiple screws and compression screw have been placed at the proximal RIGHT femur post ORIF of a nondisplaced intertrochanteric fracture. Hip joint space preserved without dislocation. No additional focal bony abnormalities identified. IMPRESSION: Post ORIF of an intertrochanteric fracture RIGHT femur. Electronically Signed   By: Ulyses Southward M.D.   On: 09/21/2015 13:44    Scheduled Meds: . aspirin EC  325 mg Oral Q breakfast  . cefTRIAXone (ROCEPHIN)  IV  1 g Intravenous Q24H  . donepezil  5 mg Oral QHS  . feeding supplement (ENSURE ENLIVE)  237 mL Oral Daily  . magnesium hydroxide  5 mL Oral Once  . senna-docusate  1 tablet Oral BID   Continuous Infusions: . 0.9 % NaCl  with KCl 20 mEq / L 60 mL/hr at 09/22/15 0529   Assessment and plan:  Principal Problem:   Closed intertrochanteric fracture of right femur (HCC) Active Problems:   Rhabdomyolysis   Elevated troponin I level   Vertigo   Protein-calorie malnutrition, severe (HCC)   UTI (lower urinary tract infection)   Normocytic anemia   Thrombocytopenia (HCC)   Intertrochanteric fracture of right femur (HCC)  1. Status post fall at home, resulting in an acute right hip fracture. Patient said that she became dizzy and fell. She reported a history of vertigo. She denied loss of consciousness. In the ED, for evaluation, CT of her right hip revealed a nondisplaced intratrochanteric fracture of the right femur. -Orthopedic surgeon, Dr. Romeo Apple was consulted by the EDP Dr. Clarene Duke. Per Dr. Romeo Apple, the surgery was postponed until the patient was medically cleared and her lab results improved. Following the results of the 2-D echocardiogram which revealed an EF of 60-65% with no wall motion abnormalities and when her troponin I and CK trended downward, Dr. Romeo Apple performed open treatment/closed reduction internal fixation of the  right hip fracture on 09/21/15. -We'll continue to provide supportive treatment and analgesics as needed for pain. -Recommendations with physical therapy per Dr. Romeo Apple.  Mild rhabdo. Patient's total CK was 2720 on admission. She was started on IV fluids. Her total CK has progressively decreased to 312. It increased to 668 following the hip surgery. Continue gentle IV fluids. Elevated troponin I. Patient's troponin I was elevated at 0.25. This may have been a consequence consequence of rhabdo and not likely ACS.Marland Kitchen She denies chest pain. -Her troponin I trended down and eventually normalized to less than 0.03.  -Her 2-D echocardiogram revealed an EF of 60-65% and no left ventricular regional wall motion abnormality. Incidental finding of grade 1 diastolic dysfunction. No evidence of CHF.  Urinary tract infection secondary to Escherichia coli. Patient's urinalysis was consistent with infection. Urine culture was ordered and grew out Escherichia coli, sensitive to ceftriaxone. We'll continue ceftriaxone.  Thrombocytopenia and anemia. Patient's hemoglobin was 13.6 on admission. With hydration, it has decreased to 11.5-hemodilutional decrease. Her platelet count was 143 on admission and with IV fluid hydration and subcutaneous heparin, it has fallen to 98. -Subcutaneous heparin was discontinued. SCDs ordered for DVT prophylaxis. -Her vitamin B12, TSH, and RBC folate were within normal limits 3 months ago. -IV fluids were decreased prior to surgery. Continue to monitor.  Hypokalemia. Her serum potassium was 3.2 on admission. She was given intravenous runs of potassium chloride. Her potassium has improved. - Daily potassium chloride supplementation started.  Presumed mild dementia She was restarted on Aricept. She did have some confusion this morning of 09/22/15. We'll discontinue Reglan and use morphine cautiously. Dr. Romeo Apple added when necessary Haldol.  Constipation. There was moderate stool  on the CT. Twice daily Senokot S ordered.   Time spent: 25 minutes    [facility]  [facility] Pager [phone]. If 7PM-7AM, please contact night-coverage at [email or web address], password [facility] 09/22/2015, 12:08 PM  LOS: 4 days

## 2015-09-22 NOTE — Progress Notes (Signed)
PT Cancellation Note  Patient Details Name: Doy HutchingMildred Barnier Flowers MRN: [id number] DOB: 01-09-1941   Cancelled Treatment/Patient Not Seen: Arrived in patient's room, finding patient sitting upright in bed supported by elevated HOB this morning. Patient appears very pleasant and states that she does not have any stairs in her home, no assistive device, but does have a shower chair; she lives by herself and her husband died 4 years ago, she prefers to be called "Evelyn Flowers". Explained WBAT status to patient and attempted to get patient out of bed, however patient adamantly refused to participate with PT this morning, stating "I don't want to this morning". Also attempted to perform bed exercises and assessment of LE ROM, however patient would not even allow PT to attempt this, stating "My leg hurts when you do that!".   Spent a great deal of time extensively educating patient on importance of ambulation and activity post-surgery, also importance of activity/participation with PT in course of recovery. Patient continued to adamantly refuse to participate with PT this morning.   Nedra HaiKristen Elle Vezina PT, DPT [phone]

## 2015-09-22 NOTE — Anesthesia Postprocedure Evaluation (Signed)
Anesthesia Post Note  Patient: Evelyn HutchingMildred Haddox Flowers  Procedure(s) Performed: Procedure(s) (LRB): COMPRESSION HIP (Right)  Patient location during evaluation: Nursing Unit Anesthesia Type: Spinal Level of consciousness: awake and alert and oriented (oriented to self only; confusion today) Pain management: pain level controlled Vital Signs Assessment: post-procedure vital signs reviewed and stable Respiratory status: spontaneous breathing Cardiovascular status: stable Postop Assessment: no backache Anesthetic complications: no    Last Vitals:  Filed Vitals:   09/22/15 0245 09/22/15 0645  BP: 147/66 146/83  Pulse: 71 80  Temp: 37 C 37 C  Resp: 14 14    Last Pain:  Filed Vitals:   09/22/15 1111  PainSc: 0-No pain                 Rayn Enderson A

## 2015-09-22 NOTE — Progress Notes (Signed)
Patient ID: Evelyn HutchingMildred Staver V, female   DOB: 21-Jun-1941, 75 y.o.   MRN: [id number]  POD 1 RIGHT HIP FRACTURE; ORIF DHS  BP 146/83 mmHg  Pulse 80  Temp(Src) 98.6 F (37 C) (Oral)  Resp 14  Ht 5\' 3"  (1.6 m)  Wt 115 lb (52.164 kg)  BMI 20.38 kg/m2  SpO2 95%   NURSE REPORTED CONFUSION   FOLEY CATH IN PLACE WAS INCONTINENT PRIOR TO USE   CBC Latest Ref Rng 09/22/2015 09/21/2015 09/20/2015  WBC 4.0 - 10.5 K/uL 6.9 5.5 5.9  Hemoglobin 12.0 - 15.0 g/dL 11.0(L) 11.4(L) 11.1(L)  Hematocrit 36.0 - 46.0 % 32.3(L) 33.0(L) 32.7(L)  Platelets 150 - 400 K/uL 124(L) 106(L) 98(L)    BMP Latest Ref Rng 09/22/2015 09/20/2015 09/19/2015  Glucose 65 - 99 mg/dL 409(W132(H) 119(J105(H) 86  BUN 6 - 20 mg/dL 7 14 15   Creatinine 0.44 - 1.00 mg/dL 4.780.52 2.950.48 6.210.73  Sodium 135 - 145 mmol/L 135 140 140  Potassium 3.5 - 5.1 mmol/L 4.2 3.8 3.7  Chloride 101 - 111 mmol/L 102 109 108  CO2 22 - 32 mmol/L 27 25 24   Calcium 8.9 - 10.3 mg/dL 3.0(Q8.5(L) 8.3(L) 8.6(L)    ASLEEP WHEN I SAW HER  DIDN'T LOOK LIKE SHE ATE MUCH  ASSESSMENT:  STABLE, POST OP   REC REMOVE FOLEY START PT  MAKE TRAMADOL PRN AVOID MORPHINE AND DILAUDID  LAB STUDIES LOOK GOOD-ADD HALDOL 0.5MG  PRN

## 2015-09-23 ENCOUNTER — Encounter (HOSPITAL_COMMUNITY): Payer: Self-pay | Admitting: Orthopedic Surgery

## 2015-09-23 LAB — CBC
HEMATOCRIT: 35 % — AB (ref 36.0–46.0)
HEMOGLOBIN: 11.8 g/dL — AB (ref 12.0–15.0)
MCH: 31.6 pg (ref 26.0–34.0)
MCHC: 33.7 g/dL (ref 30.0–36.0)
MCV: 93.6 fL (ref 78.0–100.0)
Platelets: 135 10*3/uL — ABNORMAL LOW (ref 150–400)
RBC: 3.74 MIL/uL — AB (ref 3.87–5.11)
RDW: 12 % (ref 11.5–15.5)
WBC: 8.7 10*3/uL (ref 4.0–10.5)

## 2015-09-23 LAB — TYPE AND SCREEN
ABO/RH(D): A POS
ANTIBODY SCREEN: NEGATIVE
UNIT DIVISION: 0
Unit division: 0

## 2015-09-23 LAB — BASIC METABOLIC PANEL
ANION GAP: 8 (ref 5–15)
BUN: 8 mg/dL (ref 6–20)
CALCIUM: 8.8 mg/dL — AB (ref 8.9–10.3)
CHLORIDE: 101 mmol/L (ref 101–111)
CO2: 29 mmol/L (ref 22–32)
Creatinine, Ser: 0.47 mg/dL (ref 0.44–1.00)
GFR calc non Af Amer: >60 mL/min (ref >60)
Glucose, Bld: 106 mg/dL — ABNORMAL HIGH (ref 65–99)
POTASSIUM: 4.1 mmol/L (ref 3.5–5.1)
Sodium: 138 mmol/L (ref 135–145)

## 2015-09-23 NOTE — Clinical Social Work Placement (Addendum)
   CLINICAL SOCIAL WORK PLACEMENT  NOTE  Date:  09/23/2015  Patient Details  Name: Evelyn Flowers MRN: [id number] Date of Birth: 1940-07-20  Clinical Social Work is seeking post-discharge placement for this patient at the Skilled  Nursing Facility level of care (*CSW will initial, date and re-position this form in  chart as items are completed):  Yes   Patient/family provided with [place] Clinical Social Work Department's list of facilities offering this level of care within the geographic area requested by the patient (or if unable, by the patient's family).  Yes   Patient/family informed of their freedom to choose among providers that offer the needed level of care, that participate in Medicare, Medicaid or managed care program needed by the patient, have an available bed and are willing to accept the patient.  Yes   Patient/family informed of [facility] ownership interest in [facility] and [facility], as well as of the fact that they are under no obligation to receive care at these facilities.  PASRR submitted to EDS on 09/23/15     PASRR number received on     09/23/2015   Existing PASRR number confirmed on     09/23/2015  FL2 transmitted to all facilities in geographic area requested by pt/family on 09/23/15     FL2 transmitted to all facilities within larger geographic area on       Patient informed that his/her managed care company has contracts with or will negotiate with certain facilities, including the following:            Patient/family informed of bed offers received. Yes (daughter called and selected bed) [facility]  Physician recommends and patient chooses bed at     SNF Patient to be transferred to   on  . 09/24/2015   [facility]  Patient family notified on   of transfer.  Yes, Evelyn Flowers pt daughter  Name of family member notified:      Evelyn Flowers  PHYSICIAN Please sign  FL2     Additional Comment:    _______________________________________________ Raye Sorrowoble, Demetrie Borge N, LCSW 09/23/2015, 11:09 AM

## 2015-09-23 NOTE — Evaluation (Signed)
Physical Therapy Evaluation Patient Details Name: Evelyn Flowers MRN: [id number] DOB: [birth year] Today's Date: 09/23/2015   History of Present Illness  45 old female who  has a past medical history of Slurred speech; Vertigo; Acute encephalopathy; Lightheadedness; Underweight; Protein-calorie malnutrition, severe (HCC); Dehydration; and Diarrhea. Pt fell outside closing the door to her shed;  she could not get up due to hip pain.   [place] found her and called EMS.  CT reveals hip fx.  Pt underwent ORIF on 09/21/2015. PT attempted on 09/22/2015 with refusal.   Clinical Impression  Ms. Deyton  Lives alone.  She was independent in ambulation, driving, shopping.  She will need SNF placement to Allow her to return to her maximal functional ability.    Follow Up Recommendations SNF    Equipment Recommendations  None recommended by PT    Recommendations for Other Services Rehab consult     Precautions / Restrictions Precautions Precautions: Fall Restrictions Weight Bearing Restrictions: Yes RLE Weight Bearing: Weight bearing as tolerated      Mobility  Bed Mobility Overal bed mobility: Needs Assistance Bed Mobility: Supine to Sit     Supine to sit: Mod assist        Transfers Overall transfer level: Needs assistance Equipment used: None Transfers: Sit to/from Stand;Stand Pivot Transfers Sit to Stand: Max assist Stand pivot transfers: Max assist          Ambulation/Gait                Stairs            Wheelchair Mobility    Modified Rankin (Stroke Patients Only)             Pertinent Vitals/Pain Pain Assessment: 0-10 Pain Score: 5  Pain Intervention(s): Limited activity within patient's tolerance    Home Living Family/patient expects to be discharged to:: Skilled nursing facility Living Arrangements: Alone                    Prior Function Level of Independence: Independent                  Extremity/Trunk  Assessment               Lower Extremity Assessment: RLE deficits/detail;LLE deficits/detail   LLE Deficits / Details: generally 2+/5 for hip mm; 3+ for knee     Communication   Communication: No difficulties  Cognition Arousal/Alertness: Lethargic Behavior During Therapy: WFL for tasks assessed/performed Overall Cognitive Status: Within Functional Limits for tasks assessed                         Exercises General Exercises - Lower Extremity Ankle Circles/Pumps: Both;10 reps Quad Sets: Both;10 reps Gluteal Sets: Both;10 reps Long Arc Quad: Right;5 reps Heel Slides: 5 reps;Right (ROM limited to 30 degrees due to pain )      Assessment/Plan    PT Assessment Patient needs continued PT services  PT Diagnosis Difficulty walking;Generalized weakness;Acute pain   PT Problem List Decreased strength;Decreased range of motion;Decreased activity tolerance;Decreased balance;Decreased mobility;Pain  PT Treatment Interventions Gait training;Functional mobility training;Therapeutic activities;Therapeutic exercise;Patient/family education;Balance training;DME instruction   PT Goals (Current goals can be found in the Care Plan section) Acute Rehab PT Goals PT Goal Formulation: With patient Time For Goal Achievement: 09/26/15 Potential to Achieve Goals: Good    Frequency 7X/week           End of Session Equipment Utilized During Treatment:  Gait belt Activity Tolerance: Patient limited by pain;Patient limited by fatigue Patient left: in chair;with call bell/phone within reach;Other (comment) (nursing notified; (no chair alarm available))           Time: 0910-1000 PT Time Calculation (min) (ACUTE ONLY): 50 min   Charges:   PT Evaluation $PT Eval Moderate Complexity: 1 Procedure PT Treatments $Therapeutic Exercise: 8-22 mins   PT G CodesVirgina Organ:       Irisa Grimsley, PT CLT [phone] 09/23/2015, 10:03 AM

## 2015-09-23 NOTE — Clinical Social Work Note (Signed)
Clinical Social Work Assessment  Patient Details  Name: Evelyn HutchingMildred Imler V MRN: [id number] Date of Birth: Jun 04, 1941  Date of referral:  09/23/15               Reason for consult:  Facility Placement                Permission sought to share information with:  Case Manager, Magazine features editoracility Contact Representative, Family Supports Permission granted to share information::  Yes, Verbal Permission Granted  Name::        Agency::  SNF bed search  Relationship::  no family at bedside, lives alone  Contact Information:     Housing/Transportation Living arrangements for the past 2 months:  Single Family Home Source of Information:  Patient, Medical Team Patient Interpreter Needed:  None Criminal Activity/Legal Involvement Pertinent to Current Situation/Hospitalization:  No - Comment as needed Significant Relationships:  Adult Children Lives with:  Self Do you feel safe going back to the place where you live?  No (needing SNF due to fall) Need for family participation in patient care:  No (Coment) (unless requested)  Care giving concerns:  Patient lives alone at home and due to fall and current level of care/needs, needs SNF however patient has been refusing to participate.  Insurance requires patient to participate for SNF benefit to pay skilled care.   Social Worker assessment / plan:  LCSW received consult that plan for patient is recommended SNF.  Patient is agreeable to ST SNF.  Patient agreeable to plan when completing assessment at bedside. Patient has no preference regarding bed offers, but [facility]  Patient has a daughter and son involved in care, but declined having them called as she reports she will follow up.    LCSW will begin referral process, updated FL2 and follow up with bed offers once clinicals reviewed.  [employer] Auth needs to be obtained as well. LCSW made patient aware she has to participate to continue in rehab: SNF.   Employment status:   Retired Health and safety inspectornsurance information:  BankerMedicaid Out of State, Teacher, English as a foreign languageManaged Medicare PT Recommendations:  Skilled Nursing Facility Information / Referral to community resources:  Skilled Nursing Facility  Patient/Family's Response to care:  Agreeable to plan.  Patient/Family's Understanding of and Emotional Response to Diagnosis, Current Treatment, and Prognosis:  Patient very quiet and limited with information given. She appears tired in assessment and guarded with information, but accepting of recommendation for ST SNF.  Emotional Assessment Appearance:  Appears stated age Attitude/Demeanor/Rapport:  Other (restless, reporting in pain) Affect (typically observed):  Anxious, Irritable Orientation:  Oriented to Self, Oriented to Place, Oriented to Situation Alcohol / Substance use:  Not Applicable Psych involvement (Current and /or in the community):  No (Comment)  Discharge Needs  Concerns to be addressed:  No discharge needs identified Readmission within the last 30 days:  No Current discharge risk:  None Barriers to Discharge:  English as a second language teachernsurance Authorization, Continued Medical Work up   Raye SorrowCoble, Wynelle Dreier N, LCSW 09/23/2015, 10:54 AM

## 2015-09-23 NOTE — Progress Notes (Signed)
OT Cancellation Note  Patient Details Name: Evelyn Flowers MRN: [id number] DOB: May 10, 1941   Cancelled Treatment:     Reason evaluation not completed: Pt laying flat in bed on OT entry into room, holding rail with left hand. When asked about pain pt reports pain as low at 3/10. Pt refused to participate in OT evaluation, refused to allow OT to raise head of bed. Pt breakfast tray untouched, when asked about eating pt reports she did not want to eat anything. Pt reports she has a cane and walker at home, shower chair, was independent in ADL tasks PTA. Pt states she is open to STR if necessary. Will attempt to evaluate again at a later time.   Evelyn Flowers, OTR/L  [phone]  09/23/2015, 8:47 AM

## 2015-09-23 NOTE — [place] FL2 (Signed)
[place] MEDICAID FL2 LEVEL OF CARE SCREENING TOOL     IDENTIFICATION  Patient Name: Evelyn Flowers: [birth year] Sex: female Admission Date (Current Location): 09/18/2015  [facility] [place] and [place] Number:  [employer] and Address:  [facility],  [address], [place] [address]      Provider Number: [id number]  Attending Physician Name and Address:  Elliot Cousin, MD  Relative Name and Phone Number:       Current Level of Care: Hospital Recommended Level of Care: Skilled Nursing Facility Prior Approval Number:    Date Approved/Denied:   PASRR Number:  [id number] SS# [id number]  Discharge Plan: SNF    Current Diagnoses: Patient Active Problem List   Diagnosis Date Noted  . Intertrochanteric fracture of right femur (HCC) 09/21/2015  . Closed intertrochanteric fracture of right femur (HCC) 09/19/2015  . Elevated troponin I level 09/19/2015  . UTI (lower urinary tract infection) 09/19/2015  . Normocytic anemia 09/19/2015  . Thrombocytopenia (HCC) 09/19/2015  . Rhabdomyolysis 09/18/2015  . Dizziness   . Underweight 06/09/2015  . Protein-calorie malnutrition, severe (HCC) 06/09/2015  . Acute encephalopathy 06/09/2015  . Lightheadedness 06/09/2015  . Dehydration 06/09/2015  . Diarrhea 06/09/2015  . Slurred speech 06/08/2015  . Vertigo 06/08/2015    Orientation RESPIRATION BLADDER Height & Weight     Self, Situation, Place  Normal Incontinent Weight: 115 lb (52.164 kg) Height:   (160 cm)  BEHAVIORAL SYMPTOMS/MOOD NEUROLOGICAL BOWEL NUTRITION STATUS   stable  stable Incontinent Diet (regular)  AMBULATORY STATUS COMMUNICATION OF NEEDS Skin   Extensive Assist Verbally Surgical wounds                       Personal Care Assistance Level of Assistance  Feeding, Bathing, Dressing Bathing Assistance: Limited assistance Feeding assistance: Limited assistance Dressing Assistance: Limited assistance     Functional  Limitations Info  Sight, Hearing, Speech Sight Info: Adequate Hearing Info: Adequate Speech Info: Adequate    SPECIAL CARE FACTORS FREQUENCY  PT (By licensed PT)     PT Frequency: 5 OT Frequency: 5            Contractures Contractures Info: Not present    Additional Factors Info  Code Status, Allergies, Psychotropic, Insulin Sliding Scale, Isolation Precautions Code Status Info: Full Code Allergies Info: NKA Psychotropic Info: none Insulin Sliding Scale Info: none Isolation Precautions Info: none     Current Medications (09/23/2015):  This is the current hospital active medication list Current Facility-Administered Medications  Medication Dose Route Frequency Provider Last Rate Last Dose  . 0.9 % NaCl with KCl 20 mEq/ L  infusion   Intravenous Continuous Elliot Cousin, MD 50 mL/hr at 09/22/15 2117    . acetaminophen (TYLENOL) tablet 500 mg  500 mg Oral Q4H PRN Vickki Hearing, MD      . aspirin EC tablet 325 mg  325 mg Oral Q breakfast Vickki Hearing, MD   325 mg at 09/22/15 0859  . bisacodyl (DULCOLAX) suppository 10 mg  10 mg Rectal Daily PRN Meredeth Ide, MD      . cefTRIAXone (ROCEPHIN) 1 g in dextrose 5 % 50 mL IVPB  1 g Intravenous Q24H Elliot Cousin, MD   1 g at 09/22/15 2117  . donepezil (ARICEPT) tablet 5 mg  5 mg Oral QHS Meredeth Ide, MD   5 mg at 09/22/15 2114  . feeding supplement (ENSURE ENLIVE) (ENSURE ENLIVE) liquid 237 mL  237  mL Oral Daily Elliot Cousinenise Fisher, MD   237 mL at 09/22/15 1106  . haloperidol (HALDOL) tablet 0.5 mg  0.5 mg Oral Q6H PRN Vickki HearingStanley E Harrison, MD      . magnesium hydroxide (MILK OF MAGNESIA) suspension 5 mL  5 mL Oral Once Elliot Cousinenise Fisher, MD   5 mL at 09/19/15 1121  . menthol-cetylpyridinium (CEPACOL) lozenge 3 mg  1 lozenge Oral PRN Vickki HearingStanley E Harrison, MD       Or  . phenol (CHLORASEPTIC) mouth spray 1 spray  1 spray Mouth/Throat PRN Vickki HearingStanley E Harrison, MD      . methocarbamol (ROBAXIN) tablet 500 mg  500 mg Oral Q6H PRN Meredeth IdeGagan S Lama,  MD       Or  . methocarbamol (ROBAXIN) 500 mg in dextrose 5 % 50 mL IVPB  500 mg Intravenous Q6H PRN Meredeth IdeGagan S Lama, MD      . morphine 2 MG/ML injection 0.5 mg  0.5 mg Intravenous Q2H PRN Meredeth IdeGagan S Lama, MD   0.5 mg at 09/20/15 0359  . ondansetron (ZOFRAN) tablet 4 mg  4 mg Oral Q6H PRN Vickki HearingStanley E Harrison, MD       Or  . ondansetron [facility](ZOFRAN) injection 4 mg  4 mg Intravenous Q6H PRN Vickki HearingStanley E Harrison, MD      . senna-docusate (Senokot-S) tablet 1 tablet  1 tablet Oral BID Elliot Cousinenise Fisher, MD   1 tablet at 09/22/15 2114  . traMADol (ULTRAM) tablet 50 mg  50 mg Oral Q6H PRN Vickki HearingStanley E Harrison, MD         Discharge Medications: Please see discharge summary for a list of discharge medications.  Relevant Imaging Results:  Relevant Lab Results:   Additional Information    Raye SorrowCoble, Mehmet Scally N, LCSW

## 2015-09-23 NOTE — Care Management Note (Signed)
Case Management Note  Patient Details  Name: Evelyn Flowers MRN: [id number] Date of Birth: 08-18-1940  Subjective/Objective:                  Pt admitted with UTI and Hip Fx s/p fx repair. Pt is from home, lives alone and is ind at baseline. Pt has been hesitant to work with PT over the weekend but today has participated. PT has recommended SNF. CSW is aware of DC plan and will work with pt to make arrangements for STR.   Action/Plan: No CM needs anticipated at this time.   Expected Discharge Date:    09/24/2015              Expected Discharge Plan:  Skilled Nursing Facility  In-House Referral:  Clinical Social Work  Discharge planning Services  CM Consult  Post Acute Care Choice:  NA Choice offered to:  NA  DME Arranged:    DME Agency:     HH Arranged:    HH Agency:     Status of Service:  Completed, signed off  Medicare Important Message Given:  Yes Date Medicare IM Given:    Medicare IM give by:    Date Additional Medicare IM Given:    Additional Medicare Important Message give by:     If discussed at Long Length of Stay Meetings, dates discussed:    Additional Comments:  [name], Courteney Alderete Demske, RN 09/23/2015, 10:52 AM

## 2015-09-23 NOTE — Progress Notes (Signed)
[facility] PROGRESS NOTE  Evelyn Flowers ZOX:[id number] DOB: 02-13-1941 DOA: 09/18/2015 PCP: No primary care provider on file.    Code Status: Full code Family Communication: Discussed with patient; family not available Disposition Plan: Discharge to SNF when bed is available.   Consultants:  Orthopedic, Dr. Romeo Apple  Procedures: 2-D echocardiogram 09/19/15:Study Conclusions - Left ventricle: The cavity size was normal. Wall thickness was  normal. Systolic function was normal. The estimated ejection  fraction was in the range of 60% to 65%. Wall motion was normal;  there were no regional wall motion abnormalities. Doppler  parameters are consistent with abnormal left ventricular  relaxation (grade 1 diastolic dysfunction). - Aortic valve: Mildly calcified annulus. Trileaflet.  - Mitral valve: Mildly calcified annulus.  Antibiotics:  Rocephin 09/18/15>>  HPI/Subjective: Patient is sitting up in the chair. She says that she is not that hungry. She does agree to ensure.  Objective: Filed Vitals:   09/23/15 0637 09/23/15 1300  BP: 142/78 124/69  Pulse: 93 96  Temp: 99.6 F (37.6 C) 98.1 F (36.7 C)  Resp: 18 18  Oxygen saturation 96% on room air.  Intake/Output Summary (Last 24 hours) at 09/23/15 1456 Last data filed at 09/23/15 1400  Gross per 24 hour  Intake 1914.17 ml  Output      0 ml  Net 1914.17 ml   Filed Weights   09/18/15 1854 09/19/15 1256  Weight: 52.164 kg (115 lb) 52.164 kg (115 lb)    Exam:   General: Pleasant small framed 75 year old woman in no acute distress.  Cardiovascular: S1, S2, with a soft systolic murmur  Respiratory: Clear anteriorly with decreased breath sounds in the bases  Abdomen: Positive bowel sounds, soft, nontender, nondistended.  Musculoskeletal/extremities:Bilateral lower extremities without pedal edema. Right hip postop site with bandage in place.  Neurologic: She is alert and oriented to herself, but  could not recall the name of the hospital. She is oriented to the city and president.   Data Reviewed: Basic Metabolic Panel:  Recent Labs Lab 09/18/15 1932 09/19/15 0432 09/20/15 0524 09/22/15 0557 09/23/15 0519  NA 138 140 140 135 138  K 3.2* 3.7 3.8 4.2 4.1  CL 101 108 109 102 101  CO2 GLUCOSE 110* 86 105* 132* 106*  BUN CREATININE 0.65 0.73 0.48 0.52 0.47  CALCIUM 9.4 8.6* 8.3* 8.5* 8.8*   Liver Function Tests:  Recent Labs Lab 09/18/15 1932  AST 74*  ALT 35  ALKPHOS 77  BILITOT 1.5*  PROT 7.4  ALBUMIN 4.7   No results for input(s): LIPASE, AMYLASE in the last 168 hours. No results for input(s): AMMONIA in the last 168 hours. CBC:  Recent Labs Lab 09/18/15 1932 09/19/15 0432 09/20/15 0524 09/21/15 0557 09/22/15 0557 09/23/15 0519  WBC 10.9* 8.7 5.9 5.5 6.9 8.7  NEUTROABS 9.6*  --   --   --   --   --   HGB 13.6 11.5* 11.1* 11.4* 11.0* 11.8*  HCT 38.7 33.5* 32.7* 33.0* 32.3* 35.0*  MCV 91.3 93.3 95.3 93.5 93.6 93.6  PLT 143* 116* 98* 106* 124* 135*   Cardiac Enzymes:  Recent Labs Lab 09/18/15 1932 09/18/15 2200 09/19/15 0432 09/19/15 0434 09/19/15 1137 09/20/15 0524 09/21/15 0557 09/22/15 0557  CKTOTAL 2720*  --  1983*  --   --  771* 312* 668*  TROPONINI 0.25* 0.21*  --  0.18* 0.10* 0.04* <0.03  --    BNP (last 3  results) No results for input(s): BNP in the last 8760 hours.  ProBNP (last 3 results) No results for input(s): PROBNP in the last 8760 hours.  CBG: No results for input(s): GLUCAP in the last 168 hours.  Recent Results (from the past 240 hour(s))  Urine culture     Status: None   Collection Time: 09/18/15  7:30 PM  Result Value Ref Range Status   Specimen Description URINE, CATHETERIZED  Final   Special Requests NONE  Final   Culture   Final    >=100,000 COLONIES/mL ESCHERICHIA COLI Performed at [facility]    Report Status 09/21/2015 FINAL  Final   Organism ID, Bacteria  ESCHERICHIA COLI  Final      Susceptibility   Escherichia coli - MIC*    AMPICILLIN <=2 SENSITIVE Sensitive     CEFAZOLIN <=4 SENSITIVE Sensitive     CEFTRIAXONE <=1 SENSITIVE Sensitive     CIPROFLOXACIN >=4 RESISTANT Resistant     GENTAMICIN <=1 SENSITIVE Sensitive     IMIPENEM <=0.25 SENSITIVE Sensitive     NITROFURANTOIN <=16 SENSITIVE Sensitive     TRIMETH/SULFA >=320 RESISTANT Resistant     AMPICILLIN/SULBACTAM <=2 SENSITIVE Sensitive     PIP/TAZO <=4 SENSITIVE Sensitive     * >=100,000 COLONIES/mL ESCHERICHIA COLI  MRSA PCR Screening     Status: None   Collection Time: 09/20/15  6:29 PM  Result Value Ref Range Status   MRSA by PCR NEGATIVE NEGATIVE Final    Comment:        The GeneXpert MRSA Assay (FDA approved for NASAL specimens only), is one component of a comprehensive MRSA colonization surveillance program. It is not intended to diagnose MRSA infection nor to guide or monitor treatment for MRSA infections.   Surgical pcr screen     Status: None   Collection Time: 09/21/15  3:09 AM  Result Value Ref Range Status   MRSA, PCR NEGATIVE NEGATIVE Final   Staphylococcus aureus NEGATIVE NEGATIVE Final    Comment:        The Xpert SA Assay (FDA approved for NASAL specimens in patients over 77 years of age), is one component of a comprehensive surveillance program.  Test performance has been validated by [facility] for patients greater than or equal to 77 year old. It is not intended to diagnose infection nor to guide or monitor treatment.      Studies: No results found.  Scheduled Meds: . aspirin EC  325 mg Oral Q breakfast  . cefTRIAXone (ROCEPHIN)  IV  1 g Intravenous Q24H  . donepezil  5 mg Oral QHS  . feeding supplement (ENSURE ENLIVE)  237 mL Oral Daily  . magnesium hydroxide  5 mL Oral Once  . senna-docusate  1 tablet Oral BID   Continuous Infusions: . 0.9 % NaCl with KCl 20 mEq / L 10 mL/hr at 09/23/15 1158   Assessment and plan:  Principal  Problem:   Closed intertrochanteric fracture of right femur (HCC) Active Problems:   Rhabdomyolysis   Elevated troponin I level   Vertigo   Protein-calorie malnutrition, severe (HCC)   UTI (lower urinary tract infection)   Normocytic anemia   Thrombocytopenia (HCC)   Intertrochanteric fracture of right femur (HCC)  1. Status post fall at home, resulting in an acute right hip fracture. Patient said that she became dizzy and fell. She reported a history of vertigo. She denied loss of consciousness. In the ED, for evaluation, CT of her right hip revealed a  nondisplaced intratrochanteric fracture of the right femur. -Orthopedic surgeon, Dr. Romeo Apple was consulted by the EDP Dr. Clarene Duke. Per Dr. Romeo Apple, the surgery was postponed until the patient was medically cleared and her lab results improved. Following the results of the 2-D echocardiogram which revealed an EF of 60-65% with no wall motion abnormalities and when her troponin I and CK trended downward, Dr. Romeo Apple performed open treatment/closed reduction internal fixation of the right hip fracture on 09/21/15. -We'll continue to provide supportive treatment and analgesics as needed for pain. -Recommendations with physical therapy per Dr. Romeo Apple. Physical therapy evaluated the patient and recommended SNF. Will discharge when SNF bed is available.  Mild rhabdo. Patient's total CK was 2720 on admission. She was started on IV fluids. Her total CK has progressively decreased to 312. It increased to 668 following the hip surgery. Continue gentle IV fluids. Elevated troponin I. Patient's troponin I was elevated at 0.25. This may have been a consequence consequence of rhabdo and not likely ACS.Marland Kitchen She denies chest pain. -Her troponin I trended down and eventually normalized to less than 0.03.  -Her 2-D echocardiogram revealed an EF of 60-65% and no left ventricular regional wall motion abnormality. Incidental finding of grade 1 diastolic dysfunction.  No evidence of CHF.  Urinary tract infection secondary to Escherichia coli. Patient's urinalysis was consistent with infection. Urine culture was ordered and grew out Escherichia coli, sensitive to ceftriaxone. We'll continue ceftriaxone; day #6 of 7.  Thrombocytopenia and anemia. Patient's hemoglobin was 13.6 on admission. With hydration, it has decreased to 11.5-hemodilutional decrease. Her platelet count was 143 on admission and with IV fluid hydration and subcutaneous heparin, it had fallen to 98. -Subcutaneous heparin was discontinued. SCDs ordered for DVT prophylaxis. -Her vitamin B12, TSH, and RBC folate were within normal limits 3 months ago. -IV fluids were decreased prior to surgery. Her platelet count has improved progressively.  Hypokalemia. Her serum potassium was 3.2 on admission. She was given intravenous runs of potassium chloride. Her potassium has improved. - Daily potassium chloride supplementation started.  Mild dementia She was restarted on Aricept. She did have some confusion this morning of 09/22/15. We'll discontinue Reglan and use morphine cautiously. Dr. Romeo Apple added when necessary Haldol.  Constipation. There was moderate stool on the CT. Twice daily Senokot S ordered and given.   Time spent: 25 minutes    [facility]  [facility] Pager [phone]. If 7PM-7AM, please contact night-coverage at [email or web address], password [facility] 09/23/2015, 2:56 PM  LOS: 5 days

## 2015-09-23 NOTE — Evaluation (Signed)
Clinical/Bedside Swallow Evaluation Patient Details  Name: Evelyn HutchingMildred Flowers V MRN: [id number] Date of Birth: Dec 18, 1940  Today's Date: 09/23/2015 Time: SLP Start Time (ACUTE ONLY): 1125 SLP Stop Time (ACUTE ONLY): 1155 SLP Time Calculation (min) (ACUTE ONLY): 30 min  Past Medical History:  Past Medical History  Diagnosis Date  . Slurred speech   . Vertigo   . Acute encephalopathy   . Lightheadedness   . Underweight   . Protein-calorie malnutrition, severe (HCC)   . Dehydration   . Diarrhea    Past Surgical History:  Past Surgical History  Procedure Laterality Date  . Compression hip screw Right 09/21/2015    Procedure: COMPRESSION HIP;  Surgeon: Vickki HearingStanley E Harrison, MD;  Location: [facility];  Service: Orthopedics;  Laterality: Right;   HPI:  [age] old female who has a past medical history of Slurred speech; Vertigo; Acute encephalopathy; Lightheadedness; Underweight; Protein-calorie malnutrition, severe (HCC); Dehydration; and Diarrhea. Pt fell outside closing the door to her shed; she could not get up due to hip pain. [place] found her and called EMS. CT reveals hip fx. Pt underwent ORIF on 09/21/2015. PT attempted on 09/22/2015 with refusal.    Assessment / Plan / Recommendation Clinical Impression  Pt seen while up in chair in room. She denies difficulty swallowing and has breakfast tray sitting in front of her (untouched at 11:15 AM). Pt demonstrated reduced lingual strength reduced vocal intensity during oral motor examination. She was able to follow directions with mild/mod delay and occasional repetitions. Pt slow to respond verbally to questions. She initially tolerated thin liquids, puree, and mechanical soft without incident, however began pocketing solids and require verbal cues to alternate solids/liquids and repeat swallows to clear. SLP observed patient taking medications whole with water and she immediately choked and expectorated the liquid. She was then given her  medications whole in applesauce and pt proceeded to masticate the pills. Pill residuals noted in left and right lateral oral sulci and she benefited from liquid wash to clear. Pt demonstrated impulsivity and poor safety awareness as she proceeded to self feed while she was coughing/choking on her medications. She required tactile cues to refrain from taking additional po. She also started coughing while taking straw sips of her coffee. Straws were removed and pt cued to take small cup sips and she tolerated well. Recommend D3/mech soft and thin liquids with 100% supervision with po intake due to impulsivity and poor safety awareness. No straws; Crush medications as able in puree. Pt would benefit from follow up SLP for cognitive evaluation (she reportedly lives alone) either in acute setting or at next level of care (SNF) and SLP to f/u for dysphagia. Above to RN.    Aspiration Risk  Mild aspiration risk    Diet Recommendation Dysphagia 3 (Mech soft);Thin liquid   Liquid Administration via: No straw;Cup Medication Administration: Crushed with puree Supervision: Full supervision/cueing for compensatory strategies Compensations: Slow rate;Small sips/bites;Lingual sweep for clearance of pocketing Postural Changes: Seated upright at 90 degrees;Remain upright for at least 30 minutes after po intake    Other  Recommendations Oral Care Recommendations: Oral care BID Other Recommendations: Clarify dietary restrictions   Follow up Recommendations  Skilled Nursing facility    Frequency and Duration min 2x/week  1 week       Prognosis Prognosis for Safe Diet Advancement: Fair Barriers to Reach Goals: Cognitive deficits      Swallow Study   General Date of Onset: 09/18/15 HPI: [age] old female who has a past  medical history of Slurred speech; Vertigo; Acute encephalopathy; Lightheadedness; Underweight; Protein-calorie malnutrition, severe (HCC); Dehydration; and Diarrhea. Pt fell outside closing the  door to her shed; she could not get up due to hip pain. [place] found her and called EMS. CT reveals hip fx. Pt underwent ORIF on 09/21/2015. PT attempted on 09/22/2015 with refusal.  Type of Study: Bedside Swallow Evaluation Diet Prior to this Study: Dysphagia 3 (soft);Thin liquids Temperature Spikes Noted: No Respiratory Status: Room air History of Recent Intubation: No Behavior/Cognition: Alert;Confused;Requires cueing Oral Cavity Assessment: Within Functional Limits Oral Care Completed by SLP: No (eating meal) Oral Cavity - Dentition: Missing dentition Self-Feeding Abilities: Able to feed self Patient Positioning: Upright in chair Baseline Vocal Quality: Breathy;Low vocal intensity;Normal Volitional Cough: Strong Volitional Swallow: Able to elicit    Oral/Motor/Sensory Function Overall Oral Motor/Sensory Function: Mild impairment Facial ROM: Within Functional Limits Facial Symmetry: Within Functional Limits Facial Strength: Within Functional Limits Facial Sensation: Within Functional Limits Lingual ROM: Within Functional Limits Lingual Symmetry: Within Functional Limits Lingual Strength: Reduced Lingual Sensation: Within Functional Limits Velum: Within Functional Limits Mandible: Within Functional Limits   Ice Chips Ice chips: Not tested Presentation: Self Fed   Thin Liquid Thin Liquid: Impaired Presentation: Cup;Self Fed;Straw Oral Phase Impairments: Reduced labial seal Oral Phase Functional Implications: Right anterior spillage;Left anterior spillage;Oral holding Pharyngeal  Phase Impairments: Suspected delayed Swallow;Cough - Immediate    Nectar Thick Nectar Thick Liquid: Not tested   Honey Thick Honey Thick Liquid: Not tested   Puree Puree: Within functional limits Presentation: Spoon   Solid     Solid: Impaired Presentation: Self Fed Oral Phase Impairments: Reduced lingual movement/coordination Oral Phase Functional Implications: Right lateral sulci  pocketing;Left lateral sulci pocketing;Prolonged oral transit;Impaired mastication;Oral residue       Thank you,  [name], CCC-SLP [phone]  Romar Woodrick 09/23/2015,7:26 PM

## 2015-09-24 ENCOUNTER — Inpatient Hospital Stay
Admission: RE | Admit: 2015-09-24 | Discharge: 2018-02-02 | Disposition: A | Discharge status: Home / Self Care | Payer: Commercial Managed Care - HMO | Source: Ambulatory Visit | Attending: Internal Medicine | Admitting: Internal Medicine

## 2015-09-24 DIAGNOSIS — T148XXA Other injury of unspecified body region, initial encounter: Principal | ICD-10-CM

## 2015-09-24 DIAGNOSIS — M6281 Muscle weakness (generalized): Secondary | ICD-10-CM | POA: Diagnosis not present

## 2015-09-24 DIAGNOSIS — S72141D Displaced intertrochanteric fracture of right femur, subsequent encounter for closed fracture with routine healing: Secondary | ICD-10-CM | POA: Diagnosis not present

## 2015-09-24 DIAGNOSIS — Q782 Osteopetrosis: Secondary | ICD-10-CM

## 2015-09-24 DIAGNOSIS — D649 Anemia, unspecified: Secondary | ICD-10-CM | POA: Diagnosis not present

## 2015-09-24 DIAGNOSIS — R41841 Cognitive communication deficit: Secondary | ICD-10-CM | POA: Diagnosis not present

## 2015-09-24 DIAGNOSIS — T796XXD Traumatic ischemia of muscle, subsequent encounter: Secondary | ICD-10-CM | POA: Diagnosis not present

## 2015-09-24 DIAGNOSIS — R278 Other lack of coordination: Secondary | ICD-10-CM | POA: Diagnosis not present

## 2015-09-24 DIAGNOSIS — S72141A Displaced intertrochanteric fracture of right femur, initial encounter for closed fracture: Secondary | ICD-10-CM | POA: Diagnosis not present

## 2015-09-24 DIAGNOSIS — F0390 Unspecified dementia without behavioral disturbance: Secondary | ICD-10-CM | POA: Diagnosis not present

## 2015-09-24 DIAGNOSIS — Z780 Asymptomatic menopausal state: Secondary | ICD-10-CM

## 2015-09-24 DIAGNOSIS — E43 Unspecified severe protein-calorie malnutrition: Secondary | ICD-10-CM | POA: Diagnosis not present

## 2015-09-24 DIAGNOSIS — R7989 Other specified abnormal findings of blood chemistry: Secondary | ICD-10-CM | POA: Diagnosis not present

## 2015-09-24 DIAGNOSIS — M6282 Rhabdomyolysis: Secondary | ICD-10-CM | POA: Diagnosis not present

## 2015-09-24 DIAGNOSIS — X58XXXD Exposure to other specified factors, subsequent encounter: Secondary | ICD-10-CM | POA: Diagnosis not present

## 2015-09-24 DIAGNOSIS — D696 Thrombocytopenia, unspecified: Secondary | ICD-10-CM | POA: Diagnosis not present

## 2015-09-24 DIAGNOSIS — T796XXA Traumatic ischemia of muscle, initial encounter: Secondary | ICD-10-CM | POA: Diagnosis not present

## 2015-09-24 DIAGNOSIS — N390 Urinary tract infection, site not specified: Secondary | ICD-10-CM | POA: Diagnosis not present

## 2015-09-24 DIAGNOSIS — R262 Difficulty in walking, not elsewhere classified: Secondary | ICD-10-CM | POA: Diagnosis not present

## 2015-09-24 LAB — CBC
HCT: 31.4 % — ABNORMAL LOW (ref 36.0–46.0)
HEMOGLOBIN: 11 g/dL — AB (ref 12.0–15.0)
MCH: 33 pg (ref 26.0–34.0)
MCHC: 35 g/dL (ref 30.0–36.0)
MCV: 94.3 fL (ref 78.0–100.0)
Platelets: 168 10*3/uL (ref 150–400)
RBC: 3.33 MIL/uL — AB (ref 3.87–5.11)
RDW: 12.1 % (ref 11.5–15.5)
WBC: 7.6 10*3/uL (ref 4.0–10.5)

## 2015-09-24 MED ORDER — TRAMADOL HCL 50 MG PO TABS: 25.0000 mg | ORAL_TABLET | Freq: Four times a day (QID) | ORAL | Status: DC | PRN

## 2015-09-24 MED ORDER — SENNOSIDES-DOCUSATE SODIUM 8.6-50 MG PO TABS: 1.0000 | ORAL_TABLET | Freq: Two times a day (BID) | ORAL | Status: DC

## 2015-09-24 MED ORDER — METHOCARBAMOL 500 MG PO TABS: 500.0000 mg | ORAL_TABLET | Freq: Four times a day (QID) | ORAL | Status: DC | PRN

## 2015-09-24 MED ORDER — ASPIRIN 325 MG PO TBEC: 325.0000 mg | DELAYED_RELEASE_TABLET | Freq: Every day | ORAL | Status: DC

## 2015-09-24 NOTE — Discharge Summary (Signed)
Physician Discharge Summary  Evelyn Flowers AVW:[id number] DOB: 08/26/1940 DOA: 09/18/2015  PCP: No primary care provider on file.  Admit date: 09/18/2015 Discharge date: 09/24/2015  Time spent: Greater than 30 minutes  Recommendations for Outpatient Follow-up:  1. Patient is being discharged to the [facility] for rehabilitation. 2. Weightbearing as tolerated. 3. Wound care and follow-up hip x-ray per Dr. Romeo Apple. Patient will need to follow-up with Dr. Romeo Apple in 3 weeks.     Discharge Diagnoses:  1. Status post fall at home resulting in closed intratrochanteric fracture of the right femur. -Status post open treatment closed reduction internal fixation right hip fracture. 2. Escherichia coli urinary tract infection. 3. Mild rhabdomyolysis. 4. Elevated troponin I, from demand ischemia and fall. 5. Thrombocytopenia, resolved. 6. Dilutional normocytic anemia. 7. Severe protein calorie malnutrition. 8. Mild dementia. 9. Mild dysphagia.   Discharge Condition: Improved.  Diet recommendation: Dysphagia 3 with thin liquids.  Filed Weights   09/18/15 1854 09/19/15 1256  Weight: 52.164 kg (115 lb) 52.164 kg (115 lb)    History of present illness:  Patient is a 75 year old woman with a history of mild dementia, vertigo, and malnutrition, who presented to the ED on 09/18/2015 after falling at home. In the ED, CT scan of her hip revealed a right femur fracture. She was admitted for further evaluation and management.  Hospital Course:  1. Status post fall at home, resulting in an acute right hip fracture. Patient said that she became dizzy and fell. She reported a history of vertigo. She denied loss of consciousness. In the ED, for evaluation, CT of her right hip revealed a nondisplaced intratrochanteric fracture of the right femur. -Orthopedic surgeon, Dr. Romeo Apple was consulted by the EDP. Per Dr. Romeo Apple, the surgery was postponed until the patient was medically cleared and her  lab results improved. Following the results of the 2-D echocardiogram which revealed an EF of 60-65% with no wall motion abnormalities and when her troponin I and CK trended downward, Dr. Romeo Apple performed an open treatment/closed reduction internal fixation of the right hip fracture on 09/21/15. -Physical therapy was ordered. PT recommended short-term skilled nursing facility placement for rehabilitation. -Patient was discharged to the [facility]. Wound care and follow-up x-rays per recommendation by Dr. Romeo Apple. Aspirin ordered for DVT prophylaxis per Dr. Romeo Apple. -Patient was prescribed Robaxin and tramadol as needed for pain.  Mild rhabdo. Patient's total CK was 2720 on admission. She was started on IV fluids. Her total CK has progressively decreased to 312. It increased to 668 following the hip surgery. Elevated troponin I. Patient's troponin I was elevated at 0.25. This may have been a consequence consequence of rhabdo and not likely ACS.Marland Kitchen She denied chest pain. -Her troponin I trended down and eventually normalized to less than 0.03.  -Her 2-D echocardiogram revealed an EF of 60-65% and no left ventricular regional wall motion abnormality. Incidental finding of grade 1 diastolic dysfunction. No evidence of CHF.  Urinary tract infection secondary to Escherichia coli. Patient's urinalysis was consistent with infection. Urine culture was ordered and grew out Escherichia coli, sensitive to ceftriaxone. She completed a seven-day course of ceftriaxone.  Thrombocytopenia and anemia. Patient's hemoglobin was 13.6 on admission. With hydration, it has decreased to 11.5-hemodilutional decrease. Her platelet count was 143 on admission and with IV fluid hydration and subcutaneous heparin, it had fallen to 98. -Subcutaneous heparin was discontinued. SCDs ordered for DVT prophylaxis. -Her vitamin B12, TSH, and RBC folate were within normal limits 3 months ago. - Her  platelet count has improved  progressively and normalized. Her hemoglobin remained stable at around 11 g.  Hypokalemia. Her serum potassium was 3.2 on admission. She was given intravenous runs of potassium chloride. Her potassium has improved.  Mild dementia She was restarted on Aricept. She did have some mild confusion, but this was thought to be secondary to morphine; it was discontinued.  Constipation. There was moderate stool on the CT. Twice daily Senokot S was ordered and given.  Procedures: -Open treatment closed reduction internal fixation right hip.  -2-D echocardiogram 09/19/15:Study Conclusions - Left ventricle: The cavity size was normal. Wall thickness was  normal. Systolic function was normal. The estimated ejection  fraction was in the range of 60% to 65%. Wall motion was normal;  there were no regional wall motion abnormalities. Doppler  parameters are consistent with abnormal left ventricular  relaxation (grade 1 diastolic dysfunction). - Aortic valve: Mildly calcified annulus. Trileaflet.  - Mitral valve: Mildly calcified annulus.  Consultations:  Orthopedic surgeon, Dr. Fuller Canada.  Discharge Exam: Filed Vitals:   09/23/15 2148 09/24/15 0452  BP: 138/72 132/68  Pulse: 78 77  Temp: 98.1 F (36.7 C) 98.1 F (36.7 C)  Resp: 18 18    General: Elderly 75 year old Caucasian woman sitting up in the chair, in no acute distress. Cardiovascular: S1, S2, with soft systolic murmur. Respiratory: Decreased breath sounds in the bases, otherwise clear. Extremities: Mild tenderness over the right hip; no pedal edema.  Discharge Instructions   Discharge Instructions    Diet - low sodium heart healthy    Complete by:  As directed      Increase activity slowly    Complete by:  As directed           Current Discharge Medication List    START taking these medications   Details  aspirin EC 325 MG EC tablet Take 1 tablet (325 mg total) by mouth daily with breakfast.     methocarbamol (ROBAXIN) 500 MG tablet Take 1 tablet (500 mg total) by mouth every 6 (six) hours as needed for muscle spasms. Qty: 30 tablet, Refills: 0    senna-docusate (SENOKOT-S) 8.6-50 MG tablet Take 1 tablet by mouth 2 (two) times daily.    traMADol (ULTRAM) 50 MG tablet Take 0.5-1 tablets (25-50 mg total) by mouth every 6 (six) hours as needed for moderate pain. Qty: 30 tablet, Refills: 0      CONTINUE these medications which have NOT CHANGED   Details  acetaminophen (TYLENOL) 325 MG tablet Take 2 tablets (650 mg total) by mouth every 6 (six) hours as needed for mild pain (or Fever >/= 101). Qty: 30 tablet, Refills: 0    ENSURE PLUS (ENSURE PLUS) LIQD Take 237 mLs by mouth daily.    donepezil (ARICEPT) 5 MG tablet take one tablet by mouth daily at bedtime Refills: 3      STOP taking these medications     aspirin 325 MG tablet        No Known Allergies Follow-up Information    Follow up with Fuller Canada, MD. Schedule an appointment as soon as possible for a visit in 3 weeks.   Specialties:  Orthopedic Surgery, Radiology   Contact information:   [address] [place] [address] [phone]        The results of significant diagnostics from this hospitalization (including imaging, microbiology, ancillary and laboratory) are listed below for reference.    Significant Diagnostic Studies: Dg Chest 1 View  09/18/2015  CLINICAL DATA:  Pain following fall EXAM: CHEST 1 VIEW COMPARISON:  June 08, 2015 FINDINGS: There is slight scarring in the apices. There is no edema or consolidation. Heart size and pulmonary vascularity are normal. No adenopathy. No pneumothorax. There is evidence of old trauma involving the proximal right humerus. No acute fracture evident. IMPRESSION: No edema or consolidation.  No pneumothorax. Electronically Signed   By: Bretta Bang III M.D.   On: 09/18/2015 20:58   Dg Lumbar Spine Complete  09/18/2015  CLINICAL DATA:   Lumbago with fall EXAM: LUMBAR SPINE - COMPLETE 4+ VIEW COMPARISON:  Chest radiograph June 08, 2015 FINDINGS: Frontal, lateral, spot lumbosacral lateral, and bilateral oblique views were obtained. There are 5 non-rib-bearing lumbar type vertebral bodies. There is dextroscoliosis with a rotatory component. There is rather marked anterior wedging of the T12 vertebral body, stable. There is no other fracture. Bones are diffusely osteoporotic. There is no spondylolisthesis. There is mild disc space narrowing at L4-5 and L5-S1. There is facet osteoarthritic change at L3-4, L4-5, and L5-S1. There are scattered foci of atherosclerotic calcification in the aorta. IMPRESSION: Bones diffusely osteoporotic. Old wedge fracture at T12, stable. No acute fracture or spondylolisthesis. Scoliosis. Osteoarthritic change noted at several levels. Electronically Signed   By: Bretta Bang III M.D.   On: 09/18/2015 21:01   Ct Hip Right Wo Contrast  09/18/2015  CLINICAL DATA:  75 year old female with fall and right hip pain. EXAM: CT OF THE RIGHT HIP WITHOUT CONTRAST TECHNIQUE: Multidetector CT imaging of the right hip was performed according to the standard protocol. Multiplanar CT image reconstructions were also generated. COMPARISON:  Radiograph dated 09/18/2015 FINDINGS: Evaluation for fracture is limited due to osteopenia. There is a nondisplaced intratrochanteric fracture of the right femur. There is extension of the fracture through the base of the greater trochanter. There is no dislocation. There is no joint effusion or hematoma. The soft tissues appear unremarkable. Moderate stool noted in the rectosigmoid. IMPRESSION: Osteopenia with nondisplaced intertrochanteric fracture of the right femur. Electronically Signed   By: Elgie Collard M.D.   On: 09/18/2015 23:06   Dg Hip Operative Unilat With Pelvis Right  09/21/2015  CLINICAL DATA:  Intertrochanteric fracture RIGHT femur, ORIF EXAM: OPERATIVE RIGHT HIP (WITH  PELVIS IF PERFORMED) 7 VIEWS TECHNIQUE: Fluoroscopic spot image(s) were submitted for interpretation post-operatively. COMPARISON:  CT RIGHT hip 09/18/2015, radiographs 09/18/2015 FLUOROSCOPY TIME:  1 minutes 14 seconds Images obtained:  7 FINDINGS: Images demonstrate diffuse osseous demineralization. A lateral plate with multiple screws and compression screw have been placed at the proximal RIGHT femur post ORIF of a nondisplaced intertrochanteric fracture. Hip joint space preserved without dislocation. No additional focal bony abnormalities identified. IMPRESSION: Post ORIF of an intertrochanteric fracture RIGHT femur. Electronically Signed   By: Ulyses Southward M.D.   On: 09/21/2015 13:44   Dg Hip Unilat With Pelvis 2-3 Views Right  09/18/2015  CLINICAL DATA:  RIGHT hip and lower back pain, head vertigo while walking outside, fell in yard EXAM: DG HIP (WITH OR WITHOUT PELVIS) 2-3V RIGHT COMPARISON:  None FINDINGS: Osseous demineralization. Questionable lucency traversing the intertrochanteric region of the proximal are femur raising question of a nondisplaced intertrochanteric fracture. No additional fracture, dislocation or bone destruction. Visualized pelvis intact. IMPRESSION: Question nondisplaced intertrochanteric fracture of the RIGHT femur Electronically Signed   By: Ulyses Southward M.D.   On: 09/18/2015 21:01    Microbiology: Recent Results (from the past 240 hour(s))  Urine culture     Status:  None   Collection Time: 09/18/15  7:30 PM  Result Value Ref Range Status   Specimen Description URINE, CATHETERIZED  Final   Special Requests NONE  Final   Culture   Final    >=100,000 COLONIES/mL ESCHERICHIA COLI [facility]    Report Status 09/21/2015 FINAL  Final   Organism ID, Bacteria ESCHERICHIA COLI  Final      Susceptibility   Escherichia coli - MIC*    AMPICILLIN <=2 SENSITIVE Sensitive     CEFAZOLIN <=4 SENSITIVE Sensitive     CEFTRIAXONE <=1 SENSITIVE Sensitive      CIPROFLOXACIN >=4 RESISTANT Resistant     GENTAMICIN <=1 SENSITIVE Sensitive     IMIPENEM <=0.25 SENSITIVE Sensitive     NITROFURANTOIN <=16 SENSITIVE Sensitive     TRIMETH/SULFA >=320 RESISTANT Resistant     AMPICILLIN/SULBACTAM <=2 SENSITIVE Sensitive     PIP/TAZO <=4 SENSITIVE Sensitive     * >=100,000 COLONIES/mL ESCHERICHIA COLI  MRSA PCR Screening     Status: None   Collection Time: 09/20/15  6:29 PM  Result Value Ref Range Status   MRSA by PCR NEGATIVE NEGATIVE Final    Comment:        The GeneXpert MRSA Assay (FDA approved for NASAL specimens only), is one component of a comprehensive MRSA colonization surveillance program. It is not intended to diagnose MRSA infection nor to guide or monitor treatment for MRSA infections.   Surgical pcr screen     Status: None   Collection Time: 09/21/15  3:09 AM  Result Value Ref Range Status   MRSA, PCR NEGATIVE NEGATIVE Final   Staphylococcus aureus NEGATIVE NEGATIVE Final    Comment:        The Xpert SA Assay (FDA approved for NASAL specimens in patients over 77 years of age), is one component of a comprehensive surveillance program.  Test performance has been validated by [facility] for patients greater than or equal to 653 year old. It is not intended to diagnose infection nor to guide or monitor treatment.      Labs: Basic Metabolic Panel:  Recent Labs Lab 09/18/15 1932 09/19/15 0432 09/20/15 0524 09/22/15 0557 09/23/15 0519  NA 138 140 140 135 138  K 3.2* 3.7 3.8 4.2 4.1  CL 101 108 109 102 101  CO2 27 24 25 27 29   GLUCOSE 110* 86 105* 132* 106*  BUN 15 15 14 7 8   CREATININE 0.65 0.73 0.48 0.52 0.47  CALCIUM 9.4 8.6* 8.3* 8.5* 8.8*   Liver Function Tests:  Recent Labs Lab 09/18/15 1932  AST 74*  ALT 35  ALKPHOS 77  BILITOT 1.5*  PROT 7.4  ALBUMIN 4.7   No results for input(s): LIPASE, AMYLASE in the last 168 hours. No results for input(s): AMMONIA in the last 168 hours. CBC:  Recent  Labs Lab 09/18/15 1932  09/20/15 0524 09/21/15 0557 09/22/15 0557 09/23/15 0519 09/24/15 0504  WBC 10.9*  < > 5.9 5.5 6.9 8.7 7.6  NEUTROABS 9.6*  --   --   --   --   --   --   HGB 13.6  < > 11.1* 11.4* 11.0* 11.8* 11.0*  HCT 38.7  < > 32.7* 33.0* 32.3* 35.0* 31.4*  MCV 91.3  < > 95.3 93.5 93.6 93.6 94.3  PLT 143*  < > 98* 106* 124* 135* 168  < > = values in this interval not displayed. Cardiac Enzymes:  Recent Labs Lab 09/18/15 1932 09/18/15 2200 09/19/15  1610 09/19/15 0434 09/19/15 1137 09/20/15 0524 09/21/15 0557 09/22/15 0557  CKTOTAL 2720*  --  1983*  --   --  771* 312* 668*  TROPONINI 0.25* 0.21*  --  0.18* 0.10* 0.04* <0.03  --    BNP: BNP (last 3 results) No results for input(s): BNP in the last 8760 hours.  ProBNP (last 3 results) No results for input(s): PROBNP in the last 8760 hours.  CBG: No results for input(s): GLUCAP in the last 168 hours.     Signed:  Mazen Marcin MD.  [facility] 09/24/2015, 11:00 AM

## 2015-09-24 NOTE — Care Management Important Message (Signed)
Important Message  Patient Details  Name: Evelyn Flowers MRN: [id number] Date of Birth: 05-Oct-1940   Medicare Important Message Given:  Yes    [name], [name], RN 09/24/2015, 1:44 PM

## 2015-09-24 NOTE — Care Management Note (Signed)
Case Management Note  Patient Details  Name: Doy HutchingMildred Hays V MRN: [id number] Date of Birth: [birth year]  Expected Discharge Date:     09/24/2015             Expected Discharge Plan:  Skilled Nursing Facility  In-House Referral:  Clinical Social Work  Discharge planning Services  CM Consult  Post Acute Care Choice:  NA Choice offered to:  NA  DME Arranged:    DME Agency:     HH Arranged:    HH Agency:     Status of Service:  Completed, signed off  Medicare Important Message Given:  Yes Date Medicare IM Given:    Medicare IM give by:    Date Additional Medicare IM Given:    Additional Medicare Important Message give by:     If discussed at Long Length of Stay Meetings, dates discussed:  09/24/2015  Additional Comments: Pt discharging to SNF today. CSW is aware and has made arrangements for placement. No CM needs.   Malcolm Metrohildress, Maclovia Uher Demske, RN 09/24/2015, 1:46 PM

## 2015-09-24 NOTE — Progress Notes (Addendum)
Patient has been accepted to [facility]. MD aware of bed offer. Patient to DC today and patient along with daughter is agreeable.  Patient will be transported by hospital staff. All clinicals to be sent to facility once available.  LCSW has been called [employer]: Marcelino DusterMichelle for [employer] auth. Message left on 4/3 and 4/4.  Facility aware. Spoke with Leron CroakShay Parker from [employer] at 2:32 PM  Auth: [id number] ([facility] as well per [employer] CM. Patient to DC to SNF. Deretha EmoryHannah Allante Whitmire LCSW, MSW Clinical Social Work: [employer] [phone]

## 2015-09-24 NOTE — Evaluation (Signed)
Occupational Therapy Evaluation Patient Details Name: Evelyn Flowers MRN: [id number] DOB: 03/12/1941 Today's Date: 09/24/2015    History of Present Illness 77 old female who  has a past medical history of Slurred speech; Vertigo; Acute encephalopathy; Lightheadedness; Underweight; Protein-calorie malnutrition, severe (HCC); Dehydration; and Diarrhea. Pt fell outside closing the door to her shed;  she could not get up due to hip pain.   [place] found her and called EMS.  CT reveals hip fx.  Pt underwent ORIF on 09/21/2015. PT attempted on 09/22/2015 with refusal.    Clinical Impression   Pt awake, alert, restless this am, reluctantly agreed to OT evaluation. Pt very hesitant during bed mobility and transfers, max assist required for sit to stand and stand pivot transfer. Pt currently requires min-max assist with ADL tasks, able to perform with set-up at bed level, requires max assist for standing tasks. Pt left sitting up in chair eating breakfast, no chair alarms available however nursing staff aware of pt in chair. Pt will benefit from SNF placement on discharge to increase safety and independence during ADL and functional mobility tasks to prior level of functioning.     Follow Up Recommendations  SNF    Equipment Recommendations  None recommended by OT       Precautions / Restrictions Precautions Precautions: Fall Restrictions Weight Bearing Restrictions: Yes RLE Weight Bearing: Weight bearing as tolerated      Mobility Bed Mobility Overal bed mobility: Needs Assistance Bed Mobility: Supine to Sit     Supine to sit: Mod assist        Transfers Overall transfer level: Needs assistance Equipment used: None Transfers: Sit to/from Stand;Stand Pivot Transfers Sit to Stand: Max assist Stand pivot transfers: Max assist                 ADL Overall ADL's : Needs assistance/impaired Eating/Feeding: Set up               Upper Body Dressing : Minimal  assistance;Sitting   Lower Body Dressing: Maximal assistance;Sitting/lateral leans       Toileting- Clothing Manipulation and Hygiene: Total assistance;Sit to/from stand Toileting - Clothing Manipulation Details (indicate cue type and reason): Pt had episode of incontinence this am, OT assisted with sit to stand, nurse tech assisted with wiping. Pt unable to participate in task             Vision Vision Assessment?: No apparent visual deficits          Pertinent Vitals/Pain Pain Assessment: Faces Faces Pain Scale: Hurts little more Pain Location: right hip Pain Descriptors / Indicators: Aching;Sore Pain Intervention(s): Limited activity within patient's tolerance;Monitored during session     Hand Dominance Right   Extremity/Trunk Assessment Upper Extremity Assessment Upper Extremity Assessment: Generalized weakness   Lower Extremity Assessment Lower Extremity Assessment: Defer to PT evaluation       Communication Communication Communication: Expressive difficulties (slurred speech)   Cognition Arousal/Alertness: Awake/alert Behavior During Therapy: Restless Overall Cognitive Status: No family/caregiver present to determine baseline cognitive functioning                                Home Living Family/patient expects to be discharged to:: Skilled nursing facility Living Arrangements: Alone  Prior Functioning/Environment Level of Independence: Independent with assistive device(s)        Comments: Pt reports she used a SPC for functional mobility at home    OT Diagnosis: Generalized weakness;Acute pain   OT Problem List: Decreased strength;Decreased activity tolerance;Impaired balance (sitting and/or standing);Decreased safety awareness;Decreased knowledge of use of DME or AE;Pain    End of Session Equipment Utilized During Treatment: Gait belt  Activity Tolerance: Patient limited by  pain Patient left: in chair;with call bell/phone within reach   Time: 0805-0839 OT Time Calculation (min): 34 min Charges:  OT General Charges $OT Visit: 1 Procedure OT Evaluation $OT Eval Low Complexity: 1 Procedure  Ezra SitesLeslie Dawnna Gritz, OTR/L  [phone]  09/24/2015, 8:45 AM

## 2015-09-24 NOTE — Consult Note (Signed)
   [facility]   09/24/2015  Evelyn HutchingMildred Balsam Flowers 16-May-1941 [id number]  Patient screened for potential [facility] Care Management services. Patient is eligible for [facility] Services. Epic reveals patient's discharge plan is for SNF at discharge, there were no identifiable [facility] care [facility] needs at this time. [facility] Care [facility] services not appropriate at this time. If patient's post hospital needs change please place a [facility] Care [facility] consult.  For questions please contact:   Alben SpittleMary E. Albertha GheeNiemczura, RN, BSN, [facility]   Hospital Liaison [facility] [phone]) Business Cell  [phone]) Toll Free Office

## 2015-09-24 NOTE — Progress Notes (Signed)
IV access X 2 removed.  Assisted to wheelchair.  [facility] via wheelchair accompanied by self and Tech, assisted to bed, call light given.  Report given to [facility]  [facility], questions answered.  Patient stable when left in room 156.

## 2015-09-26 ENCOUNTER — Encounter: Payer: Self-pay | Admitting: Internal Medicine

## 2015-09-26 ENCOUNTER — Non-Acute Institutional Stay (SKILLED_NURSING_FACILITY): Payer: Commercial Managed Care - HMO | Admitting: Internal Medicine

## 2015-09-26 DIAGNOSIS — D696 Thrombocytopenia, unspecified: Secondary | ICD-10-CM | POA: Diagnosis not present

## 2015-09-26 DIAGNOSIS — R7989 Other specified abnormal findings of blood chemistry: Secondary | ICD-10-CM

## 2015-09-26 DIAGNOSIS — S72141D Displaced intertrochanteric fracture of right femur, subsequent encounter for closed fracture with routine healing: Secondary | ICD-10-CM | POA: Diagnosis not present

## 2015-09-26 DIAGNOSIS — N390 Urinary tract infection, site not specified: Secondary | ICD-10-CM

## 2015-09-26 DIAGNOSIS — D649 Anemia, unspecified: Secondary | ICD-10-CM

## 2015-09-26 DIAGNOSIS — T796XXD Traumatic ischemia of muscle, subsequent encounter: Secondary | ICD-10-CM

## 2015-09-26 DIAGNOSIS — R778 Other specified abnormalities of plasma proteins: Secondary | ICD-10-CM

## 2015-09-26 DIAGNOSIS — F0390 Unspecified dementia without behavioral disturbance: Secondary | ICD-10-CM

## 2015-09-26 NOTE — Assessment & Plan Note (Signed)
Med list reviewed; patient is not on a statin

## 2015-09-26 NOTE — Assessment & Plan Note (Signed)
Postop follow-up by Dr. Romeo AppleHarrison April 2017.

## 2015-09-26 NOTE — Assessment & Plan Note (Signed)
Full course antibiotic completed.  Monitor for clinical recurrence

## 2015-09-26 NOTE — Assessment & Plan Note (Signed)
Aricept restarted in the hospital

## 2015-09-26 NOTE — Patient Instructions (Signed)
See summary under each active problem in the Problem List with associated updated therapeutic plan. Completed document faxed to [facility]. 

## 2015-09-26 NOTE — Assessment & Plan Note (Signed)
Monitor for any clinical signs of bleeding dyscrasia

## 2015-09-26 NOTE — Assessment & Plan Note (Signed)
Monitor for any bleeding dyscrasias. Patient on full dose aspirin as prophylaxis

## 2015-09-26 NOTE — Assessment & Plan Note (Signed)
No follow-up indicated unless cardiac symptoms present clinically.

## 2015-09-26 NOTE — Progress Notes (Signed)
This is a comprehensive admission to [facility] personally performed by Marga Melnick MD on this date less than 30 days from date of admission. PCP:Dr  Zack Ezequiel Ganser HPI: She was hospitalized 3/29-09/24/15 after falling 3/29 in the context of dizziness sustaining closed intertrochanteric fracture of the right finger. She denied any cardiac or neurologic prodrome although she has a past history of vertigo.Internal fixation by Dr. Fuller Canada DVT 09/21/15 with DVT prophylaxis of aspirin and sequential compression devices. Ortho F/U  is  the last week of April.  Hospitalization was complicated by E coli urinary tract infection for which she received 7 days of ceftriaxone. She also exhibited mild rhabdomyolysis with CKs as high as 2720;it dropped to 312. She had an elevated troponin up to 0.25 which was  felt to be related to demand ischemia and the fall. It decreased to 0.03. Hemoglobin @ admission was 13.6 but with IV fluids it dropped to 11.5. This was felt to be dilutional & r possibly related to the perioperative blood loss. At admission her platelet count was 143,000 but dropped to 98,000. Heparin was discontinued and prophylaxis continued as above. The thrombocytopenia did resolve by discharge She had mild confusion which was attributed to morphine sulfate. She has a history of mild dementia and Aricept was restarted. Because of the troponin elevation echocardiogram was performed. This revealed grade 1 diastolic dysfunction. She did have mild hypokalemia with potassium of 3.2; this was corrected.  Senokot resolved constipation. She had mild elevation of AST with a value of 74. Total bilirubin was 1.5 suggesting Gilbert syndrome.  Past medical and surgical history: Other than the hip surgery; she denies any other surgeries. Social history: She drinks occasionally. She does not smoke.  Family history: Family history was updated; grandmother had cancer of an  extremity.  Comprehensive review of systems is totally negative. She even denies hip pain at this time Constitutional: No fever, chills, significant weight change, fatigue or night sweats Eyes: No redness, discharge, pain, blurred vision, double vision or loss of vision ENT/mouth: No nasal congestion, postnasal drainage,epistaxis, purulent discharge, earache, hearing loss, tinnitus ,sore throat , dental pain or hoarseness   Cardiovascular: No chest pain, palpitations, racing, irregular rhythm, syncope, claudication or edema  Respiratory: No cough, sputum production,hemoptysis,  dyspnea, paroxysmal nocturnal dyspnea, pleuritic chest pain, significant snoring or  apnea    Gastrointestinal: No heartburn,dysphagia, nausea and vomiting,abdominal pain, diarrhea, significant constipation, rectal bleeding or melena Genitourinary: No dysuria,hematuria, pyuria, frequency, urgency,  incontinence, nocturia, or dark urine  Musculoskeletal: No myalgias or muscle cramping, joint stiffness, joint swelling, joint color change, weakness Dermatologic: No rash, pruritus, urticaria, or change in color or temperature of skin Neurologic: No headache, vertigo, limb weakness, tremor, gait disturbance, seizures, memory loss, numbness or tingling Psychiatric: No significant anxiety or depression, panic attacks, insomnia, or anorexia Endocrine: No change in hair/skin/ nails, excessive thirst, excessive hunger, excessive urination or unexplained fatigue Hematologic/lymphatic: No bruising, lymphadenopathy or  abnormal clotting Allergy/immunology: No itchy/ watery eyes, significant sneezing, rhinitis, urticaria or angioedema  Physical exam:  Pertinent or positive findings: She is frail and thin. There is decreased diameter of the upper chest. Mild DIP osteoarthritic change in the hands. The pedal pulses are decreased, especially dorsalis pedis pulses. Speech is slightly slurred. She knew the President;she named the date as  09/27/2015. General appearance:Adequately nourished; no acute distress or increased work of breathing is present.   Lymphatic: No  lymphadenopathy about the head, neck, or axilla .  Eyes: No conjunctival inflammation or lid edema is present. There is no scleral icterus. Ears:  External ear exam shows no significant lesions or deformities.   Nose:  External nasal examination shows no deformity or inflammation. Nasal mucosa are pink and moist without lesions or exudates No septal dislocation or deviation.No obstruction to airflow.  Oral exam: lips and gums are healthy appearing.There is no oropharyngeal erythema or exudate . Neck:  No deformities, thyromegaly, masses, or tenderness noted.    Heart:  Normal rate and regular rhythm. S1 and S2 normal without gallop, murmur, click, rub or other extra sounds.  Lungs:Chest clear to auscultation; no wheezes, rhonchi,rales ,or rubs present. Abdomen:Bowel sounds are normal. Abdomen is soft and nontender with no organomegaly, hernias  or masses. GU: deferred as previously addressed. Extremities:  No cyanosis, edema, or clubbing noted. Neurologic exam :Strength equal but decreased in upper & lower extremities Skin: Warm & dry w/o tenting or jaundice. No significant lesions or rash.  See summary under each active problem in the Problem List with associated updated therapeutic plan

## 2015-10-15 ENCOUNTER — Ambulatory Visit (HOSPITAL_COMMUNITY): Payer: Commercial Managed Care - HMO

## 2015-10-15 ENCOUNTER — Ambulatory Visit: Payer: Commercial Managed Care - HMO | Admitting: Orthopedic Surgery

## 2015-10-15 ENCOUNTER — Ambulatory Visit (HOSPITAL_COMMUNITY)
Admit: 2015-10-15 | Discharge: 2015-10-15 | Disposition: A | Discharge status: Home / Self Care | Payer: Commercial Managed Care - HMO | Source: Ambulatory Visit | Attending: Orthopedic Surgery | Admitting: Orthopedic Surgery

## 2015-10-15 ENCOUNTER — Other Ambulatory Visit: Payer: Self-pay | Admitting: *Deleted

## 2015-10-15 VITALS — BP 132/73 | HR 78 | Ht 62.0 in | Wt 104.0 lb

## 2015-10-15 DIAGNOSIS — Z4789 Encounter for other orthopedic aftercare: Secondary | ICD-10-CM

## 2015-10-15 DIAGNOSIS — S72141D Displaced intertrochanteric fracture of right femur, subsequent encounter for closed fracture with routine healing: Secondary | ICD-10-CM | POA: Diagnosis not present

## 2015-10-15 DIAGNOSIS — X58XXXD Exposure to other specified factors, subsequent encounter: Secondary | ICD-10-CM | POA: Insufficient documentation

## 2015-10-15 NOTE — Progress Notes (Signed)
Chief Complaint  Patient presents with  . Follow-up    Hospital follow up hip fracture DOS 09/21/15    3 weeks post right hip fracture dynamic hip screw for intertrochanteric fracture. Patient is at the nursing center. She is ambulatory with a walker and assistance  Her x-ray shows fracture is healing the hardware has no complications  The suture line is intact the staples can be removed and we'll see her in about 8 weeks for repeat x-ray

## 2015-10-17 ENCOUNTER — Telehealth: Payer: Self-pay

## 2015-10-17 NOTE — Telephone Encounter (Signed)
[facility]

## 2015-10-21 DIAGNOSIS — E43 Unspecified severe protein-calorie malnutrition: Secondary | ICD-10-CM | POA: Diagnosis not present

## 2015-10-21 DIAGNOSIS — R278 Other lack of coordination: Secondary | ICD-10-CM | POA: Diagnosis not present

## 2015-10-21 DIAGNOSIS — N390 Urinary tract infection, site not specified: Secondary | ICD-10-CM | POA: Diagnosis not present

## 2015-10-21 DIAGNOSIS — M6281 Muscle weakness (generalized): Secondary | ICD-10-CM | POA: Diagnosis not present

## 2015-10-21 DIAGNOSIS — R262 Difficulty in walking, not elsewhere classified: Secondary | ICD-10-CM | POA: Diagnosis not present

## 2015-10-21 DIAGNOSIS — F0390 Unspecified dementia without behavioral disturbance: Secondary | ICD-10-CM | POA: Diagnosis not present

## 2015-10-21 DIAGNOSIS — S72141D Displaced intertrochanteric fracture of right femur, subsequent encounter for closed fracture with routine healing: Secondary | ICD-10-CM | POA: Diagnosis not present

## 2015-10-21 DIAGNOSIS — T796XXD Traumatic ischemia of muscle, subsequent encounter: Secondary | ICD-10-CM | POA: Diagnosis not present

## 2015-10-21 DIAGNOSIS — R41841 Cognitive communication deficit: Secondary | ICD-10-CM | POA: Diagnosis not present

## 2015-11-07 DIAGNOSIS — F329 Major depressive disorder, single episode, unspecified: Secondary | ICD-10-CM | POA: Diagnosis not present

## 2015-11-07 DIAGNOSIS — F0390 Unspecified dementia without behavioral disturbance: Secondary | ICD-10-CM | POA: Diagnosis not present

## 2015-11-14 ENCOUNTER — Ambulatory Visit (HOSPITAL_COMMUNITY)
Admission: RE | Admit: 2015-11-14 | Discharge: 2015-11-14 | Disposition: A | Discharge status: Home / Self Care | Payer: Commercial Managed Care - HMO | Source: Ambulatory Visit | Attending: Orthopedic Surgery | Admitting: Orthopedic Surgery

## 2015-11-14 ENCOUNTER — Ambulatory Visit: Payer: Commercial Managed Care - HMO | Admitting: Orthopedic Surgery

## 2015-11-14 DIAGNOSIS — T148 Other injury of unspecified body region: Secondary | ICD-10-CM | POA: Diagnosis not present

## 2015-11-14 DIAGNOSIS — X58XXXA Exposure to other specified factors, initial encounter: Secondary | ICD-10-CM | POA: Diagnosis not present

## 2015-11-14 DIAGNOSIS — Z9889 Other specified postprocedural states: Secondary | ICD-10-CM | POA: Diagnosis not present

## 2015-11-14 DIAGNOSIS — S72001A Fracture of unspecified part of neck of right femur, initial encounter for closed fracture: Secondary | ICD-10-CM | POA: Diagnosis not present

## 2015-11-20 DIAGNOSIS — F0390 Unspecified dementia without behavioral disturbance: Secondary | ICD-10-CM | POA: Diagnosis not present

## 2015-11-20 DIAGNOSIS — F329 Major depressive disorder, single episode, unspecified: Secondary | ICD-10-CM | POA: Diagnosis not present

## 2015-11-21 ENCOUNTER — Ambulatory Visit: Payer: Commercial Managed Care - HMO | Admitting: Orthopedic Surgery

## 2015-11-21 VITALS — BP 122/83 | HR 85 | Ht 62.0 in | Wt 103.0 lb

## 2015-11-21 DIAGNOSIS — Z4789 Encounter for other orthopedic aftercare: Secondary | ICD-10-CM

## 2015-11-21 DIAGNOSIS — S72141D Displaced intertrochanteric fracture of right femur, subsequent encounter for closed fracture with routine healing: Secondary | ICD-10-CM

## 2015-11-21 NOTE — Progress Notes (Signed)
Chief Complaint  Patient presents with  . Follow-up    Right hip DOS 09/21/15   BP 122/83 mmHg  Pulse 85  Ht 5\' 2"  (1.575 m)  Wt 103 lb (46.72 kg)  BMI 18.83 kg/m2  Postop visit status post dynamic hip screw right hip for inotrope fracture  The patient is doing very well. Her pain is been relief she's walking with a walker she's happy with the result  Her leg lengths are equal she has flexion of 125 without pain  We'll x-ray again in 6 weeks.  The x-ray at the hospital was reviewed it included a pelvis and AP and a lateral of the hip the plate and screw construct is intact there no hardware complications or fractures healing well.

## 2015-11-26 ENCOUNTER — Encounter: Payer: Self-pay | Admitting: Internal Medicine

## 2015-11-26 ENCOUNTER — Non-Acute Institutional Stay (SKILLED_NURSING_FACILITY): Payer: Commercial Managed Care - HMO | Admitting: Internal Medicine

## 2015-11-26 DIAGNOSIS — S72141D Displaced intertrochanteric fracture of right femur, subsequent encounter for closed fracture with routine healing: Secondary | ICD-10-CM | POA: Diagnosis not present

## 2015-11-26 DIAGNOSIS — F0390 Unspecified dementia without behavioral disturbance: Secondary | ICD-10-CM

## 2015-11-26 DIAGNOSIS — R21 Rash and other nonspecific skin eruption: Secondary | ICD-10-CM | POA: Diagnosis not present

## 2015-11-26 DIAGNOSIS — D649 Anemia, unspecified: Secondary | ICD-10-CM

## 2015-11-26 NOTE — Progress Notes (Signed)
Location:  [facility] Room Number: 154/D Place of Service:  SNF (31) Provider:  Edmon CrapeArlo Kauri Garson  No primary care provider on file.  No care team member to display  Extended Emergency Contact Information Primary Emergency Contact: [facility],Shannon Address: [address]          [place], [place] [id number] [place] Home Phone: [phone] Mobile Phone: [phone] Relation: Daughter Secondary Emergency Contact: Gena Frayurner,Clark  [place] Mobile Phone: [phone] Relation: Son  Code Status:  Full Code Goals of care: Advanced Directive information Advanced Directives 11/26/2015  Does patient have an advance directive? Yes  Does patient want to make changes to advanced directive? No - Patient declined  Copy of advanced directive(s) in chart? Yes  Would patient like information on creating an advanced directive? -     Chief Complaint  Patient presents with  . Acute Visit    Patients c/o Red ichy bumps on back   Medical management of chronic issues including right hip fracture with repair dementia thrombocytopenia anemia-- HPI:  Pt is a 75 y.o. female seen today for an acute visit for itchy red rash on her back-also follow-up of medical conditions as noted above.  Patient appears to be doing well she has no complaints tonight she is bright alert smiling.  She has been seen by psychiatric services secondary to concerns of depression apparently she was found to have a flat affect at times-she was started on Zoloft but she is refusing this she says because of nausea.  Actually today she is smiling interactive alert we'll discontinue the Zoloft since she is not taking it and alert psychiatric services for follow-up but clinically she appears to be doing fairly well in this regard from what I see today although apparently she still has periods where she has somewhat of a subdued flat affect.  Most acute issue today is an itchy rash  on her back she says she's been scratching it-apparently this appeared over the last day or 2.  I do not see that she's been getting any new medications recently.  She appears to have recovered well from her right hip fracture with repair he does not report any issues here. She continues on aspirin for prophylaxis as ordered for tramadol as needed for pain as well as Robaxin when necessary  She does have a history of dementia this appears to be mild-to-moderate. She continues on Aricept low-dose 5 mg a day  She also has a history of thrombocytopenia this appeared normalized 160,000 on April 4 we will update this there's been no increased bruising or bleeding.  .    Vital signs appear to be stable   Past Medical History  Diagnosis Date  . Vertigo   . Acute encephalopathy 2012    In context of diarrhea and clinical dehydration  . Underweight   . Protein-calorie malnutrition, severe (HCC)   . Dementia     Noncompliant with Aricept as per daughter   Past Surgical History  Procedure Laterality Date  . Compression hip screw Right 09/21/2015    Procedure: COMPRESSION HIP;  Surgeon: Vickki HearingStanley E Harrison, MD;  Location: [facility];  Service: Orthopedics;  Laterality: Right;    No Known Allergies  Current Outpatient Prescriptions on File Prior to Visit  Medication Sig Dispense Refill  . acetaminophen (TYLENOL) 325 MG tablet Take 2 tablets (650 mg total) by mouth every 6 (six) hours as needed for mild pain (or Fever >/= 101). 30 tablet  0  . aspirin EC 325 MG EC tablet Take 1 tablet (325 mg total) by mouth daily with breakfast.    . donepezil (ARICEPT) 5 MG tablet take one tablet by mouth daily at bedtime  3  . ENSURE PLUS (ENSURE PLUS) LIQD Take 237 mLs by mouth daily.    . methocarbamol (ROBAXIN) 500 MG tablet Take 1 tablet (500 mg total) by mouth every 6 (six) hours as needed for muscle spasms. 30 tablet 0  . senna-docusate (SENOKOT-S) 8.6-50 MG tablet Take 1 tablet by mouth 2 (two) times  daily.     No current facility-administered medications on file prior to visit.    Review of Systems   In general has no complaints denies any fever or chills.  Skin rash of back as noted above.  Head ears eyes nose mouth and throat does not complain of any visual changes or sore throat.  Respiratory no shortness of breath or cough.  Cardiac no complaints of chest pain.  GI is not complaining of a nausea vomiting diarrhea constipation or abdominal discomfort.  GU is not complaining of dysuria.  Muscle skeletal is not complaining of any hip pain at this time surgical scar appears well-healed on the right hip.  Neurologic is not complaining dizziness headache or numbness.  In psych does have a history of dementia but appears to be doing well with supportive care here-also concerns for depression she is refusing her Zoloft as noted above.    Immunization History  Administered Date(s) Administered  . PPD Test 06/12/2015   Pertinent  Health Maintenance Due  Topic Date Due  . DEXA SCAN  11/25/2016 (Originally 04/09/2004)  . PNA vac Low Risk Adult (1 of 2 - PCV13) 11/25/2016 (Originally 04/09/2004)  . INFLUENZA VACCINE  01/21/2016   No flowsheet data found. Functional Status Survey:    Filed Vitals:   11/26/15 1251  BP: 132/76  Pulse: 80  Temp: 98.9 F (37.2 C)  TempSrc: Oral  Resp: 20  Height: 5\' 2"  (1.575 m)  Weight: 103 lb (46.72 kg)  SpO2: 96%   Body mass index is 18.83 kg/(m^2). Physical Exam In general this is a pleasant elderly female in no distress she is bright alert smiling lying in bed comfortably.  Her skin is warm and dry on her back there is an erythematous rash with somewhat of a spotty distribution these are maculopapular areas more so on the left lower backs a few on the right back-there is no drainage bleeding or sign of infection at this time.  It appears there are some excoriations scratching marks. Eyes pupils appear reactive light sclerae  and conjunctivae are clear visual acuity appears grossly intact.  Oropharynx clear mucous membranes moist.  Chest is clear to auscultation with somewhat shallow air entry there is no labored breathing.  Heart is regular rate and rhythm without murmur gallop or rub she has no lower extremity edema of significance pedal pulses are palpable slightly reduced.  Musculoskeletal limited exam since she is in bed but is able to move all extremities 4 there is a well-healed surgical scar in the right hip upper extremity strength appears to be preserved I do not see any deformities.  She does have arthritic changes most muscle in her hands.  Neurologic is grossly intact no lateralizing findings her speech appears to be essentially clear.  Abdomen is soft nontender positive bowel sounds.  Psych she is oriented to self is pleasant and conversant-oriented to month said the day was Monday  is actually Tuesday but it did say Monday on the board in her room     Labs reviewed:  Recent Labs  06/09/15 0532  09/20/15 0524 09/22/15 0557 09/23/15 0519  NA 138  < > 140 135 138  K 3.7  < > 3.8 4.2 4.1  CL 104  < > 109 102 101  CO2 27  < > 25 27 29   GLUCOSE 115*  < > 105* 132* 106*  BUN 15  < > 14 7 8   CREATININE 0.60  < > 0.48 0.52 0.47  CALCIUM 9.0  < > 8.3* 8.5* 8.8*  MG 2.0  --   --   --   --   PHOS 3.5  --   --   --   --   < > = values in this interval not displayed.  Recent Labs  06/08/15 1615 06/09/15 0532 09/18/15 1932  AST 21 18 74*  ALT 14 12* 35  ALKPHOS 110 92 77  BILITOT 0.8 0.6 1.5*  PROT 7.7 6.1* 7.4  ALBUMIN 4.5 3.7 4.7    Recent Labs  06/08/15 1615  09/18/15 1932  09/22/15 0557 09/23/15 0519 09/24/15 0504  WBC 7.8  < > 10.9*  < > 6.9 8.7 7.6  NEUTROABS 5.9  --  9.6*  --   --   --   --   HGB 13.9  < > 13.6  < > 11.0* 11.8* 11.0*  HCT 41.1  < > 38.7  < > 32.3* 35.0* 31.4*  MCV 91.3  < > 91.3  < > 93.6 93.6 94.3  PLT 220  < > 143*  < > 124* 135* 168  < > =  values in this interval not displayed. Lab Results  Component Value Date   TSH 1.782 06/09/2015   No results found for: HGBA1C No results found for: CHOL, HDL, LDLCALC, LDLDIRECT, TRIG, CHOLHDL  Significant Diagnostic Results in last 30 days:  Dg Hip Unilat With Pelvis 2-3 Views Right  11/14/2015  CLINICAL DATA:  Recent rt hip fx with surgery. EXAM: DG HIP (WITH OR WITHOUT PELVIS) 2-3V RIGHT COMPARISON:  10/15/2015 FINDINGS: Status post ORIF of the right hip. Femoral head is well positioned in the acetabulum. No acute fracture or subluxation identified. IMPRESSION: Status post ORIF of the right hip.  No acute abnormality. Electronically Signed   By: Norva Pavlov M.D.   On: 11/14/2015 16:15    Assessment/Plan #1 back rash-at this point will treat with a steroid cream and monitor for any changes and appears itching is the main complaint here and hopefully steroid cream will be effective with this as well as with the rash although again we will have to watch this.  #2 history of closed right hip fracture status post repair she has been followed by orthopedics is doing well in this regards poor orthopedic note on June 1. Is using a walker-does have tramadol as well as Robaxin as needed for pain although it appears this is pretty well controlled.  #3 history of thrombocytopenia this appeared normalized on lab done 2 months ago we will update this with a CBC.  #4-history depression again Zoloft has been discontinued secondary to nonuse will write order to have psych reevaluate this clinically she appears to be stable in this regards today.  #5 history of normocytic anemia hemoglobin appear to be relatively stable at 11.0 back in April will update this as well   #6 history of Rhabdomyolosis CK level at one point  was 2720 back on May 29-this has trended down and then trended slightly up after her surgery at 668 back in April-will update this although I suspect this is largely near normalization  status post surgery and again will update the lab  #7 history of hypokalemia apparently this was replenished in the hospital will update a metabolic panel as well.  #8-dementia-Appears to be mild.. No behaviors have been reported she is on low-dose Aricept.   [id number]--of note greater than 35 minutes spent assessing patient-reviewing her chart-reviewing her labs-discussing her status with nursing staff-and coordinating and formulating a plan of care for numerous diagnoses-of note greater than 50% of time spent coordinating plan of care        London Sheer, [place] [phone]

## 2015-11-27 ENCOUNTER — Encounter (HOSPITAL_COMMUNITY)
Admission: RE | Admit: 2015-11-27 | Discharge: 2015-11-27 | Disposition: A | Discharge status: Home / Self Care | Payer: Commercial Managed Care - HMO | Source: Skilled Nursing Facility | Attending: Internal Medicine | Admitting: Internal Medicine

## 2015-11-27 DIAGNOSIS — Z4789 Encounter for other orthopedic aftercare: Secondary | ICD-10-CM | POA: Insufficient documentation

## 2015-11-27 DIAGNOSIS — F0390 Unspecified dementia without behavioral disturbance: Secondary | ICD-10-CM | POA: Insufficient documentation

## 2015-11-27 DIAGNOSIS — E43 Unspecified severe protein-calorie malnutrition: Secondary | ICD-10-CM | POA: Insufficient documentation

## 2015-11-27 DIAGNOSIS — Z9181 History of falling: Secondary | ICD-10-CM | POA: Insufficient documentation

## 2015-11-27 DIAGNOSIS — M6281 Muscle weakness (generalized): Secondary | ICD-10-CM | POA: Insufficient documentation

## 2015-11-28 ENCOUNTER — Other Ambulatory Visit: Payer: Self-pay

## 2015-11-28 MED ORDER — TRAMADOL HCL 50 MG PO TABS: ORAL_TABLET | ORAL | Status: DC

## 2015-11-28 NOTE — Telephone Encounter (Signed)
[facility] request refill 

## 2015-12-11 ENCOUNTER — Encounter: Payer: Self-pay | Admitting: Internal Medicine

## 2015-12-11 ENCOUNTER — Non-Acute Institutional Stay (SKILLED_NURSING_FACILITY): Payer: Commercial Managed Care - HMO | Admitting: Internal Medicine

## 2015-12-11 DIAGNOSIS — B029 Zoster without complications: Secondary | ICD-10-CM

## 2015-12-11 DIAGNOSIS — D696 Thrombocytopenia, unspecified: Secondary | ICD-10-CM | POA: Diagnosis not present

## 2015-12-11 DIAGNOSIS — R21 Rash and other nonspecific skin eruption: Secondary | ICD-10-CM

## 2015-12-11 DIAGNOSIS — T796XXS Traumatic ischemia of muscle, sequela: Secondary | ICD-10-CM | POA: Diagnosis not present

## 2015-12-11 NOTE — Progress Notes (Signed)
Location:  [facility] Room Number: 154/D Place of Service:  SNF (31) Provider:  Edmon CrapeArlo Miranda Garber  No primary care provider on file.  No care team member to display  Extended Emergency Contact Information Primary Emergency Contact: [facility] Address: [address]          [place], [place] [id number] [place] Home Phone: [phone] Mobile Phone: [phone] Relation: Daughter Secondary Emergency Contact: Gena Frayurner,Clark  [place] Mobile Phone: [phone] Relation: Son  Code Status: Full Code Goals of care: Advanced Directive information Advanced Directives 12/11/2015  Does patient have an advance directive? Yes  Does patient want to make changes to advanced directive? No - Patient declined  Copy of advanced directive(s) in chart? Yes     Chief Complaint  Patient presents with  . Acute Visit    ptients c/o Has rash    HPI:  Pt is a 75 y.o. female seen today for an acute visit for A rash-apparently this rash extends from the right side of her neck down to the mid thorax-apparently it is somewhat of a spotty appearance has almost of vesicular appearance-there is some suspicion this may be herpes Zoster-patient does complain of itching but not extensive pain with this.  She was seen earlier this monthFor somewhat of a spotty macular papular rash more so on her back she did receive a steroid cream appears this rash she is still somewhat persistent however although appears somewhat improved.  She does not complain of any fever or chills.  I do note previously she does have a history of thrombocytopenia on lab done on April 4 this showed normalization at 168,000 will update this.  She also has a history of an elevated CK level most recently 618 on lab done in early April will update this as well.  She appears to be doing well with supportive care in the facility.  She is receiving rehabilitation after sustaining a  right hip fracture with repair--per nursing she is doing well with this.     Past Medical History  Diagnosis Date  . Vertigo   . Acute encephalopathy 2012    In context of diarrhea and clinical dehydration  . Underweight   . Protein-calorie malnutrition, severe (HCC)   . Dementia     Noncompliant with Aricept as per daughter   Past Surgical History  Procedure Laterality Date  . Compression hip screw Right 09/21/2015    Procedure: COMPRESSION HIP;  Surgeon: Vickki HearingStanley E Harrison, MD;  Location: [facility];  Service: Orthopedics;  Laterality: Right;    No Known Allergies  Current Outpatient Prescriptions on File Prior to Visit  Medication Sig Dispense Refill  . acetaminophen (TYLENOL) 325 MG tablet Take 2 tablets (650 mg total) by mouth every 6 (six) hours as needed for mild pain (or Fever >/= 101). 30 tablet 0  . aspirin EC 325 MG EC tablet Take 1 tablet (325 mg total) by mouth daily with breakfast.    . ENSURE PLUS (ENSURE PLUS) LIQD Take 237 mLs by mouth daily.    . methocarbamol (ROBAXIN) 500 MG tablet Take 1 tablet (500 mg total) by mouth every 6 (six) hours as needed for muscle spasms. 30 tablet 0  . senna-docusate (SENOKOT-S) 8.6-50 MG tablet Take 1 tablet by mouth 2 (two) times daily.     No current facility-administered medications on file prior to visit.    Review of Systems Somewhat limited secondary to dementia provided by nursing as well.  In general no complaints of fever chills.  Skin-rashes as noted above.  Head ears eyes nose mouth and throat has prescription lenses does not complain of visual changes or sore throat.  Respiration is not complaining of any shortness breath or cough.  Cardiac no chest pain or significant lower extremity edema.  GI is not complaining of a nausea vomiting diarrhea or constipation or abdominal discomfort.  Muscle skeletal is not really complaining of hip or joint pain at this time.  Neurologic is not complaining of dizziness or  headache worsened without feelings.  Psyche at one point was though she had depression and increased flat affect but she refused her Zoloft prescribed by the psychiatric nurse practitioner- and this appears to be stabilized she is pleasant alert smiling nursing does not really report any issues at this point Immunization History  Administered Date(s) Administered  . PPD Test 06/12/2015   Pertinent  Health Maintenance Due  Topic Date Due  . DEXA SCAN  11/25/2016 (Originally 04/09/2004)  . PNA vac Low Risk Adult (1 of 2 - PCV13) 11/25/2016 (Originally 04/09/2004)  . INFLUENZA VACCINE  01/21/2016   No flowsheet data found. Functional Status Survey:    Filed Vitals:   12/11/15 1424  BP: 108/59  Pulse: 70  Temp: 97.1 F (36.2 C)  TempSrc: Oral  Resp: 16   There is no weight on file to calculate BMI. Physical Exam In general this is a pleasant elderly female in no distress sitting comfortably in her wheelchair.  Her skin is warm and dry-there is an erythematous vesicular type rash that extends from the right side of her neck down to about mid thorax-this is mainly on the right side-.  She also has a spotty somewhat macular papular rash on her lower back  Eyes pupils appear reactive to light bilaterally she has prescription lenses visual acuity appears grossly intact.  Oropharynx is clear mucous membranes moist.     Chest is clear to auscultation there is no labored breathing.  Heart is regular rate and rhythm without murmur gallop or rub she does not have significant lower extremity edema.  Abdomen soft nontender positive bowel sounds.  Muscle skeletal ambulating in a wheelchair today I did not note any deformities although exam was limited secondary to her being in a wheelchair she does have arthritic changes noted in her hands.  Neurologic is grossly intact no lateralizing findings.  Psych she is oriented to self is pleasant and appropriate Labs reviewed:  Recent  Labs  06/09/15 0532  09/20/15 0524 09/22/15 0557 09/23/15 0519  NA 138  < > 140 135 138  K 3.7  < > 3.8 4.2 4.1  CL 104  < > 109 102 101  CO2 27  < > 25 27 29   GLUCOSE 115*  < > 105* 132* 106*  BUN 15  < > 14 7 8   CREATININE 0.60  < > 0.48 0.52 0.47  CALCIUM 9.0  < > 8.3* 8.5* 8.8*  MG 2.0  --   --   --   --   PHOS 3.5  --   --   --   --   < > = values in this interval not displayed.  Recent Labs  06/08/15 1615 06/09/15 0532 09/18/15 1932  AST 21 18 74*  ALT 14 12* 35  ALKPHOS 110 92 77  BILITOT 0.8 0.6 1.5*  PROT 7.7 6.1* 7.4  ALBUMIN 4.5 3.7 4.7    Recent Labs  06/08/15 1615  09/18/15 1932  09/22/15 0557 09/23/15 0519 09/24/15 0504  WBC 7.8  < > 10.9*  < > 6.9 8.7 7.6  NEUTROABS 5.9  --  9.6*  --   --   --   --   HGB 13.9  < > 13.6  < > 11.0* 11.8* 11.0*  HCT 41.1  < > 38.7  < > 32.3* 35.0* 31.4*  MCV 91.3  < > 91.3  < > 93.6 93.6 94.3  PLT 220  < > 143*  < > 124* 135* 168  < > = values in this interval not displayed. Lab Results  Component Value Date   TSH 1.782 06/09/2015   No results found for: HGBA1C No results found for: CHOL, HDL, LDLCALC, LDLDIRECT, TRIG, CHOLHDL  Significant Diagnostic Results in last 30 days:  Dg Hip Unilat With Pelvis 2-3 Views Right  11/14/2015  CLINICAL DATA:  Recent rt hip fx with surgery. EXAM: DG HIP (WITH OR WITHOUT PELVIS) 2-3V RIGHT COMPARISON:  10/15/2015 FINDINGS: Status post ORIF of the right hip. Femoral head is well positioned in the acetabulum. No acute fracture or subluxation identified. IMPRESSION: Status post ORIF of the right hip.  No acute abnormality. Electronically Signed   By: Norva Pavlov M.D.   On: 11/14/2015 16:15    Assessment/Plan  1 dermatitis unspecified-this appears to be somewhat of a herpes zoster presentation with the vesicles on her upper body around the neck and thorax area will start Valtrex thousand milligrams every 8 hours for 7 days and monitor-also will monitor the back rash as  well.  The upper body rash has somewhat of a linear presentation and unilateral-again this will have to be watched area  At this point will initiate contact precautions as well.  #2 history of thrombocytopenia apparently this has resolved but would like to updated lab.  She also has an elevated CK level which was thought secondary to rhabdomyolysis with CK level as high as 2720 in the hospital-.  At this point will update another CK level although clinically she appears to be doing well in this regards.  Of note also will update a CMP for updated values.  She does have some history of hyperkalemia in the past as well as liver function elevation.  [id number].  lLuster, Ricki Miller, [place] [phone]

## 2015-12-12 ENCOUNTER — Encounter (HOSPITAL_COMMUNITY)
Admission: RE | Admit: 2015-12-12 | Discharge: 2015-12-12 | Disposition: A | Discharge status: Home / Self Care | Payer: Commercial Managed Care - HMO | Source: Skilled Nursing Facility | Attending: Internal Medicine | Admitting: Internal Medicine

## 2015-12-12 DIAGNOSIS — F0390 Unspecified dementia without behavioral disturbance: Secondary | ICD-10-CM | POA: Diagnosis not present

## 2015-12-12 DIAGNOSIS — Z9181 History of falling: Secondary | ICD-10-CM | POA: Diagnosis not present

## 2015-12-12 DIAGNOSIS — Z4789 Encounter for other orthopedic aftercare: Secondary | ICD-10-CM | POA: Diagnosis not present

## 2015-12-12 DIAGNOSIS — E43 Unspecified severe protein-calorie malnutrition: Secondary | ICD-10-CM | POA: Diagnosis not present

## 2015-12-12 DIAGNOSIS — M6281 Muscle weakness (generalized): Secondary | ICD-10-CM | POA: Diagnosis not present

## 2015-12-12 LAB — COMPREHENSIVE METABOLIC PANEL
ALBUMIN: 3.5 g/dL (ref 3.5–5.0)
ALK PHOS: 73 U/L (ref 38–126)
ALT: 30 U/L (ref 14–54)
AST: 29 U/L (ref 15–41)
Anion gap: 5 (ref 5–15)
BUN: 21 mg/dL — ABNORMAL HIGH (ref 6–20)
CHLORIDE: 103 mmol/L (ref 101–111)
CO2: 30 mmol/L (ref 22–32)
Calcium: 9.1 mg/dL (ref 8.9–10.3)
Creatinine, Ser: 0.61 mg/dL (ref 0.44–1.00)
GFR calc Af Amer: >60 mL/min (ref >60)
GFR calc non Af Amer: >60 mL/min (ref >60)
Glucose, Bld: 91 mg/dL (ref 65–99)
Potassium: 4.3 mmol/L (ref 3.5–5.1)
SODIUM: 138 mmol/L (ref 135–145)
Total Bilirubin: 0.5 mg/dL (ref 0.3–1.2)
Total Protein: 6.3 g/dL — ABNORMAL LOW (ref 6.5–8.1)

## 2015-12-12 LAB — CBC WITH DIFFERENTIAL/PLATELET
BASOS PCT: 1 %
Basophils Absolute: 0 10*3/uL (ref 0.0–0.1)
EOS ABS: 0.2 10*3/uL (ref 0.0–0.7)
Eosinophils Relative: 6 %
HEMATOCRIT: 34.9 % — AB (ref 36.0–46.0)
HEMOGLOBIN: 11.8 g/dL — AB (ref 12.0–15.0)
LYMPHS ABS: 1.9 10*3/uL (ref 0.7–4.0)
Lymphocytes Relative: 45 %
MCH: 31.6 pg (ref 26.0–34.0)
MCHC: 33.8 g/dL (ref 30.0–36.0)
MCV: 93.6 fL (ref 78.0–100.0)
Monocytes Absolute: 0.3 10*3/uL (ref 0.1–1.0)
Monocytes Relative: 6 %
NEUTROS ABS: 1.8 10*3/uL (ref 1.7–7.7)
NEUTROS PCT: 43 %
Platelets: 182 10*3/uL (ref 150–400)
RBC: 3.73 MIL/uL — AB (ref 3.87–5.11)
RDW: 12.1 % (ref 11.5–15.5)
WBC: 4.2 10*3/uL (ref 4.0–10.5)

## 2015-12-12 LAB — CBC AND DIFFERENTIAL: WBC: 4.2 10*3/mL

## 2015-12-12 LAB — BASIC METABOLIC PANEL
BUN: 21 mg/dL (ref 4–21)
Creatinine: 0.6 mg/dL (ref <=1.1)
GLUCOSE: 91 mg/dL
Sodium: 138 mmol/L (ref 137–147)

## 2015-12-12 LAB — HEPATIC FUNCTION PANEL: BILIRUBIN, TOTAL: 0.5 mg/dL

## 2015-12-12 LAB — CK: CK TOTAL: 39 U/L (ref 38–234)

## 2016-01-06 ENCOUNTER — Ambulatory Visit: Payer: Commercial Managed Care - HMO | Admitting: Orthopedic Surgery

## 2016-01-09 ENCOUNTER — Encounter: Payer: Self-pay | Admitting: Internal Medicine

## 2016-01-09 ENCOUNTER — Non-Acute Institutional Stay (SKILLED_NURSING_FACILITY): Payer: Commercial Managed Care - HMO | Admitting: Internal Medicine

## 2016-01-09 DIAGNOSIS — D649 Anemia, unspecified: Secondary | ICD-10-CM | POA: Diagnosis not present

## 2016-01-09 DIAGNOSIS — E43 Unspecified severe protein-calorie malnutrition: Secondary | ICD-10-CM | POA: Diagnosis not present

## 2016-01-09 DIAGNOSIS — R350 Frequency of micturition: Secondary | ICD-10-CM | POA: Diagnosis not present

## 2016-01-09 DIAGNOSIS — D696 Thrombocytopenia, unspecified: Secondary | ICD-10-CM | POA: Diagnosis not present

## 2016-01-09 DIAGNOSIS — T796XXS Traumatic ischemia of muscle, sequela: Secondary | ICD-10-CM

## 2016-01-09 NOTE — Progress Notes (Signed)
   [facility] Room: 154 Full Code  Chief Complaint  Patient presents with  . Medical Management of Chronic Issues    Medical Management of Chronic Issues   Allergies  Allergen Reactions  . Beef-Derived Products    This is a nursing facility follow up of chronic medical diagnosis  Interim medical record and care since last [facility] visit was updated with review of diagnostic studies and change in clinical status since last visit were documented.  [id number]:Medical diagnoses include anemia, thrombocytopenia, protein/caloric malnutrition, and dementia. [facility] following internal fixation of a closed intertrochanteric fracture of the right femur. Postoperatively she had mild anemia with a hemoglobin of 11.5. She was also found to have rhabdomyolysis with CKs up to 2720. She is not on a statin. She's had follow-up labs within the last month. Protein was mildly decreased at 6.3 but albumin was normal at 3.5. Hemoglobin was 11.8. CK was low normal limits at 39  Comprehensive review of systems is negative except for "I pee a lot".It is difficult to discern whether he is describing excessive urination or frequency She denies other genitourinary symptoms except some nocturia.Glucoses 91-132. Constitutional: No fever,significant weight change, fatigue  Eyes: No redness, discharge, pain, vision change ENT/mouth: No nasal congestion,  purulent discharge, earache,change in hearing ,sore throat  Cardiovascular: No chest pain, palpitations,paroxysmal nocturnal dyspnea, claudication, edema  Respiratory: No cough, sputum production,hemoptysis, DOE , significant snoring,apnea   Gastrointestinal: No heartburn,dysphagia,abdominal pain, nausea / vomiting,rectal bleeding, melena,change in bowels Genitourinary: No dysuria,hematuria, pyuria Musculoskeletal: No joint stiffness, joint swelling, weakness,pain Dermatologic: No rash, pruritus, change in appearance of  skin Neurologic: No dizziness,headache,syncope, seizures, numbness , tingling Psychiatric: No significant anxiety , depression, insomnia, anorexia Endocrine: No change in hair/skin/ nails, excessive thirst, excessive hunger  Hematologic/lymphatic: No significant bruising, lymphadenopathy,abnormal bleeding Allergy/immunology: No itchy/ watery eyes, significant sneezing, urticaria, angioedema  Physical exam:  General appearance:Thin but adequately nourished; no acute distress , increased work of breathing is present.   Lymphatic: No lymphadenopathy about the head, neck, axilla . Eyes: No conjunctival inflammation or lid edema is present. There is no scleral icterus. Ears:  External ear exam shows no significant lesions or deformities. The television volume was extremely high; but she was able to understand my questions Nose:  External nasal examination shows no deformity or inflammation. Nasal mucosa are pink and moist without lesions ,exudates Oral exam: lips and gums are healthy appearing.There is no oropharyngeal erythema or exudate . Neck:  No thyromegaly, masses, tenderness noted.    Heart:  Normal rate and regular rhythm. S1 and S2 normal without gallop, murmur, click, rub .  Lungs:Chest clear to auscultation without wheezes, rhonchi,rales , rubs. Abdomen:Bowel sounds are normal. Abdomen is soft and nontender with no organomegaly, hernias,masses. GU: deferred . Extremities:  No cyanosis, clubbing,edema  Neurologic exam : She gave the date correctly Strength equal  in upper & lower extremities Deep tendon reflexes are equal Skin: Warm & dry w/o tenting. No significant lesions or rash.    See summary under each active problem in the Problem List with associated updated therapeutic plan

## 2016-01-09 NOTE — Assessment & Plan Note (Signed)
Resolved No bleeding dyscrasias as per history

## 2016-01-09 NOTE — Assessment & Plan Note (Signed)
Albumin is now normal Mild protein deficiency persists Continue Ensure Plus

## 2016-01-09 NOTE — Assessment & Plan Note (Signed)
Resolved based on CK of 39 in June

## 2016-01-09 NOTE — Patient Instructions (Signed)
New orders for Matrix entry. UA Urine C&S

## 2016-01-09 NOTE — Assessment & Plan Note (Signed)
Anemia stable to improved  She denies any bleeding dyscrasias No additional labs indicated

## 2016-01-10 ENCOUNTER — Encounter (HOSPITAL_COMMUNITY)
Admission: AD | Admit: 2016-01-10 | Discharge: 2016-01-10 | Disposition: A | Discharge status: Home / Self Care | Payer: Commercial Managed Care - HMO | Source: Skilled Nursing Facility | Attending: Internal Medicine | Admitting: Internal Medicine

## 2016-01-10 DIAGNOSIS — B029 Zoster without complications: Secondary | ICD-10-CM | POA: Diagnosis not present

## 2016-01-10 DIAGNOSIS — R42 Dizziness and giddiness: Secondary | ICD-10-CM | POA: Insufficient documentation

## 2016-01-10 DIAGNOSIS — F0390 Unspecified dementia without behavioral disturbance: Secondary | ICD-10-CM | POA: Insufficient documentation

## 2016-01-10 DIAGNOSIS — E43 Unspecified severe protein-calorie malnutrition: Secondary | ICD-10-CM | POA: Insufficient documentation

## 2016-01-10 DIAGNOSIS — F3289 Other specified depressive episodes: Secondary | ICD-10-CM | POA: Diagnosis not present

## 2016-01-10 LAB — URINALYSIS, ROUTINE W REFLEX MICROSCOPIC
BILIRUBIN URINE: NEGATIVE
GLUCOSE, UA: NEGATIVE mg/dL
Ketones, ur: NEGATIVE mg/dL
Nitrite: NEGATIVE
PH: 7.5 (ref 5.0–8.0)
Protein, ur: NEGATIVE mg/dL
SPECIFIC GRAVITY, URINE: 1.01 (ref 1.005–1.030)

## 2016-01-10 LAB — URINE MICROSCOPIC-ADD ON: SQUAMOUS EPITHELIAL / LPF: NONE SEEN

## 2016-01-11 LAB — HEMOGLOBIN A1C
Hgb A1c MFr Bld: 5.4 % (ref 4.8–5.6)
MEAN PLASMA GLUCOSE: 108 mg/dL

## 2016-01-13 LAB — URINE CULTURE: Culture: >=100000 — AB

## 2016-01-31 ENCOUNTER — Encounter (HOSPITAL_COMMUNITY)
Admission: RE | Admit: 2016-01-31 | Discharge: 2016-01-31 | Disposition: A | Discharge status: Home / Self Care | Payer: Commercial Managed Care - HMO | Source: Skilled Nursing Facility | Attending: Internal Medicine | Admitting: Internal Medicine

## 2016-01-31 ENCOUNTER — Non-Acute Institutional Stay (SKILLED_NURSING_FACILITY): Payer: Commercial Managed Care - HMO | Admitting: Internal Medicine

## 2016-01-31 ENCOUNTER — Encounter: Payer: Self-pay | Admitting: Internal Medicine

## 2016-01-31 DIAGNOSIS — E43 Unspecified severe protein-calorie malnutrition: Secondary | ICD-10-CM | POA: Diagnosis not present

## 2016-01-31 DIAGNOSIS — Z9181 History of falling: Secondary | ICD-10-CM | POA: Insufficient documentation

## 2016-01-31 DIAGNOSIS — M6281 Muscle weakness (generalized): Secondary | ICD-10-CM | POA: Diagnosis not present

## 2016-01-31 DIAGNOSIS — Z4789 Encounter for other orthopedic aftercare: Secondary | ICD-10-CM | POA: Insufficient documentation

## 2016-01-31 DIAGNOSIS — R55 Syncope and collapse: Secondary | ICD-10-CM | POA: Diagnosis not present

## 2016-01-31 DIAGNOSIS — F0390 Unspecified dementia without behavioral disturbance: Secondary | ICD-10-CM | POA: Insufficient documentation

## 2016-01-31 LAB — COMPREHENSIVE METABOLIC PANEL
ALBUMIN: 3.9 g/dL (ref 3.5–5.0)
ALT: 31 U/L (ref 14–54)
ANION GAP: 7 (ref 5–15)
AST: 33 U/L (ref 15–41)
Alkaline Phosphatase: 67 U/L (ref 38–126)
BILIRUBIN TOTAL: 0.6 mg/dL (ref 0.3–1.2)
BUN: 18 mg/dL (ref 6–20)
CO2: 30 mmol/L (ref 22–32)
Calcium: 9.2 mg/dL (ref 8.9–10.3)
Chloride: 102 mmol/L (ref 101–111)
Creatinine, Ser: 0.62 mg/dL (ref 0.44–1.00)
GFR calc Af Amer: >60 mL/min (ref >60)
GFR calc non Af Amer: >60 mL/min (ref >60)
GLUCOSE: 119 mg/dL — AB (ref 65–99)
POTASSIUM: 3.9 mmol/L (ref 3.5–5.1)
Sodium: 139 mmol/L (ref 135–145)
TOTAL PROTEIN: 6.7 g/dL (ref 6.5–8.1)

## 2016-01-31 LAB — CBC WITH DIFFERENTIAL/PLATELET
BASOS PCT: 0 %
Basophils Absolute: 0 10*3/uL (ref 0.0–0.1)
Eosinophils Absolute: 0.1 10*3/uL (ref 0.0–0.7)
Eosinophils Relative: 2 %
HEMATOCRIT: 39.1 % (ref 36.0–46.0)
HEMOGLOBIN: 13.1 g/dL (ref 12.0–15.0)
LYMPHS ABS: 0.7 10*3/uL (ref 0.7–4.0)
LYMPHS PCT: 12 %
MCH: 32 pg (ref 26.0–34.0)
MCHC: 33.5 g/dL (ref 30.0–36.0)
MCV: 95.4 fL (ref 78.0–100.0)
MONO ABS: 0.3 10*3/uL (ref 0.1–1.0)
MONOS PCT: 5 %
NEUTROS ABS: 4.9 10*3/uL (ref 1.7–7.7)
NEUTROS PCT: 81 %
Platelets: 209 10*3/uL (ref 150–400)
RBC: 4.1 MIL/uL (ref 3.87–5.11)
RDW: 12.5 % (ref 11.5–15.5)
WBC: 6.1 10*3/uL (ref 4.0–10.5)

## 2016-01-31 LAB — TROPONIN I

## 2016-01-31 LAB — TSH: TSH: 1.066 u[IU]/mL (ref 0.350–4.500)

## 2016-01-31 NOTE — Progress Notes (Signed)
Location:   [facility] Room Number: 154/D Place of Service:  SNF (31) Provider:  Edmon Crape  No primary care provider on file.  No care team member to display  Extended Emergency Contact Information Primary Emergency Contact: [facility] Address: [address] [place], [place] [address] [place] Home Phone: [phone] Mobile Phone: [phone] Relation: Daughter Secondary Emergency Contact: Gena Fray [place] Mobile Phone: [phone] Relation: Son  Code Status:  Full Code Goals of care: Advanced Directive information Advanced Directives 01/31/2016  Does patient have an advance directive? Yes  Type of Advance Directive (No Data)  Does patient want to make changes to advanced directive? No - Patient declined  Copy of advanced directive(s) in chart? Yes  Would patient like information on creating an advanced directive? -     Chief Complaint  Patient presents with  . Acute Visit    Syncope    HPI:  Pt is a 75 y.o. female seen today for an acute visit for A short unresponsive episode.  Patient had been in her usual state of health but apparently was having a bowel movement today and became unresponsive for short. She was placed in bed and apparently fairly quickly returned back to her baseline initially slightly confused but in short order and became more alert responsive and appropriate.  Vital signs were taken during episode which appear to be stable.  Currently she is resting in bed comfortably does not complain of any chest pain shortness of breath headache or dizziness.  Apparently shortly after the incident she did not have any complaints either.  She was admitted to the facility in April status post fixation of a right femur fracture.    This was complicated with rhabdomyolysis as well however CK level has come down most recent 39  She did have an elevated troponin on admission to  the hospital 0.25 this was thought secondary to demand ischemia in the fall-this did come down to 0.03.  An echo was done which did show grade 1 diastolic dysfunction.        Past Medical History:  Diagnosis Date  . Acute encephalopathy 2012   In context of diarrhea and clinical dehydration  . Dementia    Noncompliant with Aricept as per daughter  . Protein-calorie malnutrition, severe (HCC)   . Underweight   . Vertigo    Past Surgical History:  Procedure Laterality Date  . COMPRESSION HIP SCREW Right 09/21/2015   Procedure: COMPRESSION HIP;  Surgeon: Vickki Hearing, MD;  Location: [facility];  Service: Orthopedics;  Laterality: Right;    Allergies  Allergen Reactions  . Beef-Derived Products     Current Outpatient Prescriptions on File Prior to Visit  Medication Sig Dispense Refill  . acetaminophen (TYLENOL) 325 MG tablet Take 2 tablets (650 mg total) by mouth every 6 (six) hours as needed for mild pain (or Fever >/= 101). 30 tablet 0  . aspirin EC 325 MG EC tablet Take 1 tablet (325 mg total) by mouth daily with breakfast.    . donepezil (ARICEPT) 5 MG tablet Take 5 mg by mouth at bedtime.    . ENSURE PLUS (ENSURE PLUS) LIQD Take 237 mLs by mouth daily.    . methocarbamol (ROBAXIN) 500 MG tablet Take 1 tablet (500 mg total) by mouth every 6 (six) hours as needed for muscle spasms. 30 tablet 0  . Nutritional Supplements (ENSURE CLEAR) LIQD  Give every morning to increased daily calorie and protein intake and preserve lean body mass.    . senna-docusate (SENOKOT-S) 8.6-50 MG tablet Take 1 tablet by mouth 2 (two) times daily.    . traMADol (ULTRAM) 50 MG tablet Take 1/2 tablet ( 25 mg ) by mouth every 6 hours as needed for moderate pain. Take 1 tablet ( 50 mg ) by mouth every 6 hours as needed for severe pain.     No current facility-administered medications on file prior to visit.     Review of Systems   In general no complaints of fever or chills.She feels fine.  Skin  does not complain of rashes or itching.  Head ears eyes nose mouth and throat does not complain of any visual changes or sore throat no headache  Respiratory is not complaining of any shortness of breath or cough.  Cardiac denies any chest pain or palpitations.  GI is not complaining of any nausea vomiting diarrhea or constipation.  GU no complaints of dysuria.  Musculoskeletal is not complaining of any joint pain does have some generalized weakness which is not new.  Neurologic is not complaining of any dizziness headache or syncopal-type feelings.-She does have a history of vertigo however   psych does have some history of dementia this appears to be moderate she is pleasant smiling appears to be at her baseline  Immunization History  Administered Date(s) Administered  . PPD Test 06/12/2015   Pertinent  Health Maintenance Due  Topic Date Due  . INFLUENZA VACCINE  05/22/2016 (Originally 01/21/2016)  . DEXA SCAN  11/25/2016 (Originally 04/09/2004)  . PNA vac Low Risk Adult (1 of 2 - PCV13) 11/25/2016 (Originally 04/09/2004)   No flowsheet data found. Functional Status Survey:    Vitals:   01/31/16 1249  BP: (!) 151/83  Pulse: 69  Resp: (!) 24  Temp: 98.2 F (36.8 C)  TempSrc: Oral  Updated blood pressure is 138/76 There is no height or weight on file to calculate BMI. Physical Exam   In general this is a frail elderly female in no distress resting comfortably in bed.  Her skin is warm and dry.  Eyes pupils appear reactive light sclera and conjunctiva are clear visual acuity appears grossly intact.  Oropharynx is clear mucous membranes moist.  Chest is clear to auscultation with somewhat shallow air entry there is no labored breathing.  Heart is regular rate and rhythm with a rate in the 80s-without murmur gallop or rub she does not really have significant lower extremity edema.  GU could not appreciate any overt suprapubic tenderness to  palpation.  Musculoskeletal does move all extremities 4 with general frailty.  Neurologic is grossly intact her speech is clear no lateralizing findings.  Psych she is oriented to self is conversant pleasant smiling appears to be at her baseline there is some moderate cognitive deficits but does not appear to be changed from baseline.  Of note he did do orthostatic blood pressures we did show blood pressure 138/76 lying down-it was 120/80 sitting-standing was difficult to do secondary to patient having general weakness with attempting to stand.  But she did not report any dizziness while standing the short period.    Labs reviewed:  Recent Labs  06/09/15 0532  09/22/15 0557 09/23/15 0519 12/12/15 12/12/15 0750  NA 138  < > 135 138 138 138  K 3.7  < > 4.2 4.1  --  4.3  CL 104  < > 102 101  --  103  CO2 27  < > 27 29  --  30  GLUCOSE 115*  < > 132* 106*  --  91  BUN 15  < > 7 8 21  21*  CREATININE 0.60  < > 0.52 0.47 0.6 0.61  CALCIUM 9.0  < > 8.5* 8.8*  --  9.1  MG 2.0  --   --   --   --   --   PHOS 3.5  --   --   --   --   --   < > = values in this interval not displayed.  Recent Labs  06/09/15 0532 09/18/15 1932 12/12/15 0750  AST 18 74* 29  ALT 12* 35 30  ALKPHOS 92 77 73  BILITOT 0.6 1.5* 0.5  PROT 6.1* 7.4 6.3*  ALBUMIN 3.7 4.7 3.5    Recent Labs  06/08/15 1615  09/18/15 1932  09/23/15 0519 09/24/15 0504 12/12/15 12/12/15 0750  WBC 7.8  < > 10.9*  < > 8.7 7.6 4.2 4.2  NEUTROABS 5.9  --  9.6*  --   --   --   --  1.8  HGB 13.9  < > 13.6  < > 11.8* 11.0*  --  11.8*  HCT 41.1  < > 38.7  < > 35.0* 31.4*  --  34.9*  MCV 91.3  < > 91.3  < > 93.6 94.3  --  93.6  PLT 220  < > 143*  < > 135* 168  --  182  < > = values in this interval not displayed. Lab Results  Component Value Date   TSH 1.782 06/09/2015   Lab Results  Component Value Date   HGBA1C 5.4 01/10/2016   No results found for: CHOL, HDL, LDLCALC, LDLDIRECT, TRIG, CHOLHDL  Significant  Diagnostic Results in last 30 days:  No results found.  Assessment/Plan #1 syncope-suspect vasovagal episode-patient has returned quickly to her baseline-vital signs appear to be stable she does not complaining of any chest pain dizziness headache shortness of breath-will have to have checked for constipation and abdominal exam was benign.  Also will update an EKG-and obtain stat blood work including a CBC metabolic panel and troponin level.  Addendum.  EKG does not show any changes from baseline--- Labwork appears to be unremarkable with a white count of 6.1 hemoglobin 13.1 plateletsof 209  Sodium was 139 potassium 3.9 BUN 18 creatinine 0.62 liver function tests  TSH was within normal limits at 1.066.    Of note patient continues to be at her baseline per serial exams-will have staff monitor for constipation-again abdominal exam was quite benign-also will monitor vital signs pulse ox every shift for now and obtain blood pressure and pulse lying and sitting daily with a log in provider book for review.  [id number] note greater than 40 minutes spent assessing patient-reassessing patient-reviewing her chart-her labs-and diagnostic studies-discussing her status with nursing staff-and coordinating and formulating a plan of care-of note greater than 50% of time spent coordinating plan of care    London Sheer, [place] [phone]

## 2016-02-26 ENCOUNTER — Non-Acute Institutional Stay (SKILLED_NURSING_FACILITY): Payer: Commercial Managed Care - HMO | Admitting: Internal Medicine

## 2016-02-26 ENCOUNTER — Encounter: Payer: Self-pay | Admitting: Internal Medicine

## 2016-02-26 DIAGNOSIS — F0390 Unspecified dementia without behavioral disturbance: Secondary | ICD-10-CM | POA: Diagnosis not present

## 2016-02-26 DIAGNOSIS — S72141D Displaced intertrochanteric fracture of right femur, subsequent encounter for closed fracture with routine healing: Secondary | ICD-10-CM | POA: Diagnosis not present

## 2016-02-26 DIAGNOSIS — E43 Unspecified severe protein-calorie malnutrition: Secondary | ICD-10-CM

## 2016-02-26 DIAGNOSIS — D696 Thrombocytopenia, unspecified: Secondary | ICD-10-CM

## 2016-02-26 DIAGNOSIS — D649 Anemia, unspecified: Secondary | ICD-10-CM

## 2016-02-26 NOTE — Progress Notes (Signed)
Location:   [facility] Room Number: 154/D Place of Service:  SNF (31) Provider:  Iley Deignan,Refoel Palladino  No primary care provider on file.    Extended Emergency Contact Information Primary Emergency Contact: [facility] Address: [address]          [place], [place] [address] [place] Home Phone: [phone] Mobile Phone: [phone] Relation: Daughter Secondary Emergency Contact: Gena Frayurner,Clark  [place] Mobile Phone: [phone] Relation: Son  Code Status:  Full Code Goals of care: Advanced Directive information Advanced Directives 02/26/2016  Does patient have an advance directive? Yes  Type of Advance Directive (No Data)  Does patient want to make changes to advanced directive? No - Patient declined  Copy of advanced directive(s) in chart? Yes  Would patient like information on creating an advanced directive? -     Chief Complaint  Patient presents with  . Medical Management of Chronic Issues    Routine Visit  To assess medical management of chronic medical issues including history of right femur fracture with repair-protein calorie malnutrition-dementia-recent history of syncope-anemia-thrombocytopenia  HPI:  Pt is a 75 y.o. female seen today for medical management of chronic diseases as noted above.  She was seen approximately one month ago for an unresponsive episode after going to the bathroom-workup was negative including EKG and troponin levels her laboratory work was unremarkable actually showed improvement in her hemoglobin.  TSH was within normal limits.  There is been no further reoccurrence and is thought to be most likely a vasovagal episode.  Apparently patient is somewhat resistant therapy although she states she does work with them-I did encourage her that if she is going to get stronger to do as much she can.  She is status post right femur fracture with repair. She did develop it appears some  anemia as well as thrombocytopenia with platelets down to 98,000 however this has rebounded back into normal range with platelets 182,000 on lab done 01/31/2016 previous platelet count was 29,000 so this appears to have stabilized.  Hemoglobin also showed improvement at 13.1 this continues to rise.  She does have a history of dementia this appears to be I would say mild she was oriented to person place day of week month and year as well as Pres. and what political party he is in.  She did have a history of elevated AST in the hospital but this appears to have normalized as well as her bilirubin which is normalized was possible suspicion of Gilbert syndrome during her hospitalization but again this appears to be within normal range now.    She does have a history of protein calorie malnutrition but this is well appears to have improved her last albumin was 3.9 which appears to be rising she does take ensure supplementation.  Currently she is resting in bed comfortably has no acute complaints.  Her vital signs appear to be stable.  I do note per chart review she did have an echo done in the hospital which showed grade 1 diastolic dysfunction she has gained about 4 pounds over the past several weeks but this appears to be more likely appetite related she has no evidence of edema shortness of breath chest congestion or chest pain.     Past Medical History:  Diagnosis Date  . Acute encephalopathy 2012   In context of diarrhea and clinical dehydration  . Dementia    Noncompliant with Aricept as per daughter  . Protein-calorie malnutrition, severe (HCC)   .  Underweight   . Vertigo    Past Surgical History:  Procedure Laterality Date  . COMPRESSION HIP SCREW Right 09/21/2015   Procedure: COMPRESSION HIP;  Surgeon: Vickki Hearing, MD;  Location: [facility];  Service: Orthopedics;  Laterality: Right;    Allergies  Allergen Reactions  . Beef-Derived Products     Current Outpatient  Prescriptions on File Prior to Visit  Medication Sig Dispense Refill  . acetaminophen (TYLENOL) 325 MG tablet Take 2 tablets (650 mg total) by mouth every 6 (six) hours as needed for mild pain (or Fever >/= 101). 30 tablet 0  . aspirin EC 325 MG EC tablet Take 1 tablet (325 mg total) by mouth daily with breakfast.    . donepezil (ARICEPT) 5 MG tablet Take 5 mg by mouth at bedtime.    . ENSURE PLUS (ENSURE PLUS) LIQD Take 237 mLs by mouth daily.    . methocarbamol (ROBAXIN) 500 MG tablet Take 1 tablet (500 mg total) by mouth every 6 (six) hours as needed for muscle spasms. 30 tablet 0  . Nutritional Supplements (ENSURE CLEAR) LIQD Give every morning to increased daily calorie and protein intake and preserve lean body mass.    . senna-docusate (SENOKOT-S) 8.6-50 MG tablet Take 1 tablet by mouth 2 (two) times daily.    . traMADol (ULTRAM) 50 MG tablet Take 1/2 tablet ( 25 mg ) by mouth every 6 hours as needed for moderate pain. Take 1 tablet ( 50 mg ) by mouth every 6 hours as needed for severe pain.     No current facility-administered medications on file prior to visit.     Review of Systems   In general no complaints of fever or chills. Appears to be slowly gaining weight.  Skin does not complain of rashes or itching.  Head ears eyes nose mouth and throat does not complain of any visual changes or sore throat no headache  Respiratory is not complaining of any shortness of breath or cough.  Cardiac denies any chest pain or palpitations.  GI is not complaining of any nausea vomiting diarrhea or constipation.  GU no complaints of dysuria.  Musculoskeletal is not complaining of any joint pain does have some generalized weakness which is not new. Have encouraged her to percuss up 8 with therapy  Neurologic is not complaining of any dizziness headache or syncopal-type feelings.-She does have a history of vertigo    psych does have some history of dementia this appears to be  moderate she is pleasant smiling appears to be at her baseline   Immunization History  Administered Date(s) Administered  . PPD Test 06/12/2015   Pertinent  Health Maintenance Due  Topic Date Due  . INFLUENZA VACCINE  05/22/2016 (Originally 01/21/2016)  . DEXA SCAN  11/25/2016 (Originally 04/09/2004)  . PNA vac Low Risk Adult (1 of 2 - PCV13) 11/25/2016 (Originally 04/09/2004)   No flowsheet data found. Functional Status Survey:    Vitals:   02/26/16 1515  BP: 101/68  Pulse: 71  Resp: 18  Temp: 97.6 F (36.4 C)  TempSrc: Oral  Weight: 110 lb 9.6 oz (50.2 kg)  Height: 5\' 2"  (1.575 m)   Body mass index is 20.23 kg/m. Physical Exam   In general this is a frail elderly female in no distress resting comfortably in bed.  Her skin is warm and dry.  Eyes pupils appear reactive light sclera and conjunctiva are clear visual acuity appears grossly intact.  Oropharynx is clear mucous membranes moist.  Chest is clear to auscultation with somewhat shallow air entry there is no labored breathing.  Heart is regular rate and rhythm with a rate in the -without murmur gallop or rub she does not have  lower extremity edema.--Pedal pulses are intact  GU could not appreciate any overt suprapubic tenderness to palpation.  Musculoskeletal does move all extremities 4 with general frailty.  Neurologic is grossly intact her speech is clear no lateralizing findings.  Psych she is oriented to self--is oriented to month and year day of week   President what political party he is in --her dementia appears to be mild she is pleasant and conversant Labs reviewed:  Recent Labs  06/09/15 0532  09/23/15 0519 12/12/15 12/12/15 0750 01/31/16 1406  NA 138  < > 138 138 138 139  K 3.7  < > 4.1  --  4.3 3.9  CL 104  < > 101  --  103 102  CO2 27  < > 29  --  30 30  GLUCOSE 115*  < > 106*  --  91 119*  BUN 15  < > 8 21 21* 18  CREATININE 0.60  < > 0.47 0.6 0.61 0.62  CALCIUM 9.0  < >  8.8*  --  9.1 9.2  MG 2.0  --   --   --   --   --   PHOS 3.5  --   --   --   --   --   < > = values in this interval not displayed.  Recent Labs  09/18/15 1932 12/12/15 0750 01/31/16 1406  AST 74* 29 33  ALT 35 30 31  ALKPHOS 77 73 67  BILITOT 1.5* 0.5 0.6  PROT 7.4 6.3* 6.7  ALBUMIN 4.7 3.5 3.9    Recent Labs  09/18/15 1932  09/24/15 0504 12/12/15 12/12/15 0750 01/31/16 1406  WBC 10.9*  < > 7.6 4.2 4.2 6.1  NEUTROABS 9.6*  --   --   --  1.8 4.9  HGB 13.6  < > 11.0*  --  11.8* 13.1  HCT 38.7  < > 31.4*  --  34.9* 39.1  MCV 91.3  < > 94.3  --  93.6 95.4  PLT 143*  < > 168  --  182 209  < > = values in this interval not displayed. Lab Results  Component Value Date   TSH 1.066 01/31/2016   Lab Results  Component Value Date   HGBA1C 5.4 01/10/2016   No results found for: CHOL, HDL, LDLCALC, LDLDIRECT, TRIG, CHOLHDL  Significant Diagnostic Results in last 30 days:  No results found.  Assessment/Plan  #1 history of right hip fracture-this appears to be stable--pain appears to be controlled she does have an order for tramadol as needed-she is on aspirin 325 for anticoagulation as well as Robaxin when necessary. We will check with orthopedics to see if aspirin 325 needs to be continued-since her surgery was in early April--per review of her hospital admission note she apparently had been on aspirin at home 325 mg every 6 hours when necessary for mild pain or headache  We have encouraged cooperation with therapy here apparently per nursing she does get out in her wheelchair ambulates about the facility without difficulty.  #2 history of postop anemia-this appears to be trending up with a hemoglobin 13.1 on lab done 01/31/2016 will monitor at periodic intervals.  #3 history of thrombocytopenia this appears to have been transitory after surgery again this appears to have  normalized as well with platelets of 182,000 on lab done 01/31/2016.  #4 history of protein calorie  malnutrition again she appears to be gaining weight her albumin is increasing now at 3.9 at this point will monitor continue to encourage supplementation apparently she eats fairly well.  #5 history of dementia this appears to be mild/moderate-as noted above she is on low-dose Aricept 5 mg a day.  #6 history of diastolic CHF per echo done ringer hospitalization in April-this appears to be asymptomatic her weight gain appears to be related more to eating better.  #7 history of syncope-I suspect this was a vasovagal episode her vital signs blood pressures appear to be stable there been no further episodes this did happen when she was going to the bathroom which would mean it is quite likely more a vasovagal episode especially since there's been no reoccurrence   #8  history of rhabdomyolysis--CK level was 2720 after her fall however this normalized down to 39 on update lab.  Also note she had mildly elevated liver function tests within AST of 71 bilirubin 1.5 in the hospital-however this appears to have normalized as.  [id number] note greater than 40 minutes spent assessing patient-discussing her status with nursing staff-reviewing her chart-reviewing her labs-and coordinating and formulating a plan of care for numerous diagnoses-of note greater than 50% of time spent coordinating plan of care

## 2016-04-07 ENCOUNTER — Encounter: Payer: Self-pay | Admitting: Internal Medicine

## 2016-04-07 NOTE — Progress Notes (Signed)
Location:   [facility] Room Number: 154/D Place of Service:  SNF (31) Provider:  Anjali,Gupta  No primary care provider on file.  No care team member to display  Extended Emergency Contact Information Primary Emergency Contact: [facility] Address: [address] [place], [place] [address] [place] Home Phone: [phone] Mobile Phone: [phone] Relation: Daughter Secondary Emergency Contact: Gena Fray [place] Mobile Phone: [phone] Relation: Son  Code Status:  Full Code Goals of care: Advanced Directive information Advanced Directives 04/07/2016  Does patient have an advance directive? Yes  Type of Advance Directive (No Data)  Does patient want to make changes to advanced directive? No - Patient declined  Copy of advanced directive(s) in chart? Yes  Would patient like information on creating an advanced directive? -     Chief Complaint  Patient presents with  . Medical Management of Chronic Issues    Routine Visit    HPI:  Pt is a 75 y.o. female seen today for medical management of chronic diseases.     Past Medical History:  Diagnosis Date  . Acute encephalopathy 2012   In context of diarrhea and clinical dehydration  . Dementia    Noncompliant with Aricept as per daughter  . Protein-calorie malnutrition, severe (HCC)   . Underweight   . Vertigo    Past Surgical History:  Procedure Laterality Date  . COMPRESSION HIP SCREW Right 09/21/2015   Procedure: COMPRESSION HIP;  Surgeon: Vickki Hearing, MD;  Location: [facility];  Service: Orthopedics;  Laterality: Right;    Allergies  Allergen Reactions  . Beef-Derived Products     Current Outpatient Prescriptions on File Prior to Visit  Medication Sig Dispense Refill  . acetaminophen (TYLENOL) 325 MG tablet Take 2 tablets (650 mg total) by mouth every 6 (six) hours as needed for mild pain (or Fever >/= 101). 30 tablet 0  .  aspirin EC 325 MG EC tablet Take 1 tablet (325 mg total) by mouth daily with breakfast.    . donepezil (ARICEPT) 5 MG tablet Take 5 mg by mouth at bedtime.    . ENSURE PLUS (ENSURE PLUS) LIQD Take 237 mLs by mouth daily.    . methocarbamol (ROBAXIN) 500 MG tablet Take 1 tablet (500 mg total) by mouth every 6 (six) hours as needed for muscle spasms. 30 tablet 0  . senna-docusate (SENOKOT-S) 8.6-50 MG tablet Take 1 tablet by mouth 2 (two) times daily.    . traMADol (ULTRAM) 50 MG tablet Take 1/2 tablet ( 25 mg ) by mouth every 6 hours as needed for moderate pain. Take 1 tablet ( 50 mg ) by mouth every 6 hours as needed for severe pain.     No current facility-administered medications on file prior to visit.      Review of Systems  Immunization History  Administered Date(s) Administered  . Influenza-Unspecified 03/20/2016  . PPD Test 06/12/2015  . Pneumococcal-Unspecified 04/02/2016   Pertinent  Health Maintenance Due  Topic Date Due  . DEXA SCAN  11/25/2016 (Originally 04/09/2004)  . PNA vac Low Risk Adult (2 of 2 - PCV13) 04/02/2017  . INFLUENZA VACCINE  Completed   No flowsheet data found. Functional Status Survey:    Vitals:   04/07/16 0935  BP: 136/72  Pulse: 76  Resp: 20  Temp: 98.1 F (36.7 C)  TempSrc: Oral   There is no height or weight on file  to calculate BMI. Physical Exam  Labs reviewed:  Recent Labs  06/09/15 0532  09/23/15 0519 12/12/15 12/12/15 0750 01/31/16 1406  NA 138  < > 138 138 138 139  K 3.7  < > 4.1  --  4.3 3.9  CL 104  < > 101  --  103 102  CO2 27  < > 29  --  30 30  GLUCOSE 115*  < > 106*  --  91 119*  BUN 15  < > 8 21 21* 18  CREATININE 0.60  < > 0.47 0.6 0.61 0.62  CALCIUM 9.0  < > 8.8*  --  9.1 9.2  MG 2.0  --   --   --   --   --   PHOS 3.5  --   --   --   --   --   < > = values in this interval not displayed.  Recent Labs  09/18/15 1932 12/12/15 0750 01/31/16 1406  AST 74* 29 33  ALT 35 30 31  ALKPHOS 77 73 67  BILITOT  1.5* 0.5 0.6  PROT 7.4 6.3* 6.7  ALBUMIN 4.7 3.5 3.9    Recent Labs  09/18/15 1932  09/24/15 0504 12/12/15 12/12/15 0750 01/31/16 1406  WBC 10.9*  < > 7.6 4.2 4.2 6.1  NEUTROABS 9.6*  --   --   --  1.8 4.9  HGB 13.6  < > 11.0*  --  11.8* 13.1  HCT 38.7  < > 31.4*  --  34.9* 39.1  MCV 91.3  < > 94.3  --  93.6 95.4  PLT 143*  < > 168  --  182 209  < > = values in this interval not displayed. Lab Results  Component Value Date   TSH 1.066 01/31/2016   Lab Results  Component Value Date   HGBA1C 5.4 01/10/2016   No results found for: CHOL, HDL, LDLCALC, LDLDIRECT, TRIG, CHOLHDL  Significant Diagnostic Results in last 30 days:  No results found.  Assessment/Plan There are no diagnoses linked to this encounter.   Family/ staff Communication:   Labs/tests ordered:       This encounter was created in error - please disregard.

## 2016-04-08 DIAGNOSIS — F0390 Unspecified dementia without behavioral disturbance: Secondary | ICD-10-CM | POA: Diagnosis not present

## 2016-04-08 DIAGNOSIS — F329 Major depressive disorder, single episode, unspecified: Secondary | ICD-10-CM | POA: Diagnosis not present

## 2016-04-09 ENCOUNTER — Non-Acute Institutional Stay (SKILLED_NURSING_FACILITY): Payer: Commercial Managed Care - HMO | Admitting: Internal Medicine

## 2016-04-09 ENCOUNTER — Encounter: Payer: Self-pay | Admitting: Internal Medicine

## 2016-04-09 DIAGNOSIS — D649 Anemia, unspecified: Secondary | ICD-10-CM

## 2016-04-09 DIAGNOSIS — F0390 Unspecified dementia without behavioral disturbance: Secondary | ICD-10-CM | POA: Diagnosis not present

## 2016-04-09 DIAGNOSIS — S72141S Displaced intertrochanteric fracture of right femur, sequela: Secondary | ICD-10-CM

## 2016-04-09 NOTE — Progress Notes (Signed)
Location:   [facility] Room Number: 154/D Place of Service:  SNF (31) Provider:  Mozes Sagar,Maysie Parkhill  No primary care provider on file.  No care team member to display  Extended Emergency Contact Information Primary Emergency Contact: [facility] Address: [address]          [place], [place] [address] [place] Home Phone: [phone] Mobile Phone: [phone] Relation: Daughter Secondary Emergency Contact: Gena Frayurner,Clark  [place] Mobile Phone: [phone] Relation: Son  Code Status:  Full Code Goals of care: Advanced Directive information Advanced Directives 04/09/2016  Does patient have an advance directive? Yes  Type of Advance Directive (No Data)  Does patient want to make changes to advanced directive? No - Patient declined  Copy of advanced directive(s) in chart? Yes  Would patient like information on creating an advanced directive? -     Chief Complaint  Patient presents with  . Medical Management of Chronic Issues    Routine Visit    HPI:  Pt is a 75 y.o. female seen today for medical management of chronic diseases.  Patient has h/o Right Femoral fracture secondary to fall requiring ORIF in 04/17. She has been in the facility since then. She has done well since then. Her Albumin return to normal with Supplement. Her anemia and CKD improved. She is able to get around in wheel chair and walker. Still stays high risk for fall.  She did have one episode of syncope in 08/17 thought to be Vaso vagal episode. Patient is stable with no complains. She does have mumble speech and hard to understand. Her weight is stable at 110 lbs.   Past Medical History:  Diagnosis Date  . Acute encephalopathy 2012   In context of diarrhea and clinical dehydration  . Dementia    Noncompliant with Aricept as per daughter  . Protein-calorie malnutrition, severe (HCC)   . Underweight   . Vertigo    Past Surgical History:    Procedure Laterality Date  . COMPRESSION HIP SCREW Right 09/21/2015   Procedure: COMPRESSION HIP;  Surgeon: Vickki HearingStanley E Harrison, MD;  Location: [facility];  Service: Orthopedics;  Laterality: Right;    Allergies  Allergen Reactions  . Beef-Derived Products     Current Outpatient Prescriptions on File Prior to Visit  Medication Sig Dispense Refill  . acetaminophen (TYLENOL) 325 MG tablet Take 2 tablets (650 mg total) by mouth every 6 (six) hours as needed for mild pain (or Fever >/= 101). 30 tablet 0  . aspirin EC 325 MG EC tablet Take 1 tablet (325 mg total) by mouth daily with breakfast.    . donepezil (ARICEPT) 5 MG tablet Take 5 mg by mouth at bedtime.    . ENSURE PLUS (ENSURE PLUS) LIQD Take 237 mLs by mouth daily.    . methocarbamol (ROBAXIN) 500 MG tablet Take 1 tablet (500 mg total) by mouth every 6 (six) hours as needed for muscle spasms. 30 tablet 0  . senna-docusate (SENOKOT-S) 8.6-50 MG tablet Take 1 tablet by mouth 2 (two) times daily.    . traMADol (ULTRAM) 50 MG tablet Take 1/2 tablet ( 25 mg ) by mouth every 6 hours as needed for moderate pain. Take 1 tablet ( 50 mg ) by mouth every 6 hours as needed for severe pain.     No current facility-administered medications on file prior to visit.      Review of Systems  Constitutional: Negative for activity change, appetite change, chills,  diaphoresis, fatigue, fever and unexpected weight change.  Respiratory: Negative.  Negative for apnea, cough, choking, chest tightness, shortness of breath and stridor.   Cardiovascular: Negative.  Negative for chest pain, palpitations and leg swelling.  Gastrointestinal: Negative.  Negative for abdominal distention, abdominal pain, anal bleeding, blood in stool, constipation, diarrhea and nausea.  Musculoskeletal: Negative for arthralgias, back pain, gait problem and joint swelling.    Immunization History  Administered Date(s) Administered  . Influenza-Unspecified 03/20/2016  . PPD Test  06/12/2015  . Pneumococcal-Unspecified 04/02/2016   Pertinent  Health Maintenance Due  Topic Date Due  . DEXA SCAN  11/25/2016 (Originally 04/09/2004)  . PNA vac Low Risk Adult (2 of 2 - PCV13) 04/02/2017  . INFLUENZA VACCINE  Completed   No flowsheet data found. Functional Status Survey:    Vitals:   04/09/16 1106  BP: 136/72  Pulse: 76  Resp: 20  Temp: 98.1 F (36.7 C)  TempSrc: Oral   There is no height or weight on file to calculate BMI. Physical Exam  Constitutional: She appears well-developed and well-nourished.  HENT:  Head: Normocephalic.  Mouth/Throat: Oropharynx is clear and moist.  Cardiovascular: Normal rate, regular rhythm and normal heart sounds.   No murmur heard. Pulmonary/Chest: Breath sounds normal. No respiratory distress. She has no wheezes. She has no rales. She exhibits no tenderness.  Abdominal: Soft. Bowel sounds are normal. She exhibits no distension. There is no tenderness. There is no rebound.  Musculoskeletal: She exhibits no edema.  Neurological: She is alert.  Not oriented though has done better before. Moves all her extremities with no Deficit.  Skin: Skin is warm. No erythema.    Labs reviewed:  Recent Labs  06/09/15 0532  09/23/15 0519 12/12/15 12/12/15 0750 01/31/16 1406  NA 138  < > 138 138 138 139  K 3.7  < > 4.1  --  4.3 3.9  CL 104  < > 101  --  103 102  CO2 27  < > 29  --  30 30  GLUCOSE 115*  < > 106*  --  91 119*  BUN 15  < > 8 21 21* 18  CREATININE 0.60  < > 0.47 0.6 0.61 0.62  CALCIUM 9.0  < > 8.8*  --  9.1 9.2  MG 2.0  --   --   --   --   --   PHOS 3.5  --   --   --   --   --   < > = values in this interval not displayed.  Recent Labs  09/18/15 1932 12/12/15 0750 01/31/16 1406  AST 74* 29 33  ALT 35 30 31  ALKPHOS 77 73 67  BILITOT 1.5* 0.5 0.6  PROT 7.4 6.3* 6.7  ALBUMIN 4.7 3.5 3.9    Recent Labs  09/18/15 1932  09/24/15 0504 12/12/15 12/12/15 0750 01/31/16 1406  WBC 10.9*  < > 7.6 4.2 4.2 6.1    NEUTROABS 9.6*  --   --   --  1.8 4.9  HGB 13.6  < > 11.0*  --  11.8* 13.1  HCT 38.7  < > 31.4*  --  34.9* 39.1  MCV 91.3  < > 94.3  --  93.6 95.4  PLT 143*  < > 168  --  182 209  < > = values in this interval not displayed. Lab Results  Component Value Date   TSH 1.066 01/31/2016   Lab Results  Component Value Date   HGBA1C  5.4 01/10/2016   No results found for: CHOL, HDL, LDLCALC, LDLDIRECT, TRIG, CHOLHDL  Significant Diagnostic Results in last 30 days:  No results found.  Assessment/Plan  S/P femoral Fracture. Patient has peaked her therapy. Will start her on Vit D and Calcium supplement. She will be good candidate for treatment for osteoporosis. Continue aspirin. D/C Robaxin  Dementia Already on Aricept. Continue supportive therapy.  Anemia with Thrombocytopenia All the labs were normal.. Will need follow up in next routine.    Family/ staff Communication:   Labs/tests ordered:

## 2016-04-29 DIAGNOSIS — F0390 Unspecified dementia without behavioral disturbance: Secondary | ICD-10-CM | POA: Diagnosis not present

## 2016-04-29 DIAGNOSIS — F419 Anxiety disorder, unspecified: Secondary | ICD-10-CM | POA: Diagnosis not present

## 2016-04-29 DIAGNOSIS — F329 Major depressive disorder, single episode, unspecified: Secondary | ICD-10-CM | POA: Diagnosis not present

## 2016-05-01 ENCOUNTER — Non-Acute Institutional Stay (SKILLED_NURSING_FACILITY): Payer: Commercial Managed Care - HMO | Admitting: Internal Medicine

## 2016-05-01 ENCOUNTER — Encounter: Payer: Self-pay | Admitting: Internal Medicine

## 2016-05-01 DIAGNOSIS — D696 Thrombocytopenia, unspecified: Secondary | ICD-10-CM

## 2016-05-01 DIAGNOSIS — D649 Anemia, unspecified: Secondary | ICD-10-CM | POA: Diagnosis not present

## 2016-05-01 DIAGNOSIS — I5032 Chronic diastolic (congestive) heart failure: Secondary | ICD-10-CM

## 2016-05-01 DIAGNOSIS — R635 Abnormal weight gain: Secondary | ICD-10-CM | POA: Diagnosis not present

## 2016-05-01 DIAGNOSIS — S72141S Displaced intertrochanteric fracture of right femur, sequela: Secondary | ICD-10-CM | POA: Diagnosis not present

## 2016-05-01 DIAGNOSIS — F0390 Unspecified dementia without behavioral disturbance: Secondary | ICD-10-CM

## 2016-05-01 NOTE — Progress Notes (Signed)
Location:   [facility] Room Number: 154/D Place of Service:  SNF (31) Provider:  Neena Beecham,Deyanira Fesler  No primary care provider on file.  No care team member to display  Extended Emergency Contact Information Primary Emergency Contact: [facility] Address: [address] [place], [place] [address] D[place] of [place] Home Phone: [phone] Mobile Phone: [phone] Relation: Daughter Secondary Emergency Contact: Gena Fray [place] of [place] Mobile Phone: [phone] Relation: Son  Code Status:  Full Code Goals of care: Advanced Directive information Advanced Directives 05/01/2016  Does patient have an advance directive? Yes  Type of Advance Directive (No Data)  Does patient want to make changes to advanced directive? No - Patient declined  Copy of advanced directive(s) in chart? Yes  Would patient like information on creating an advanced directive? -     Chief Complaint  Patient presents with  . Medical Management of Chronic Issues    Routine Visit    medical management of chronic medical issues including history of right femoral fracture-hypertension-anemia-chronic kidney disease-syncope-dementia-diastolic dysfunction-depression.   HPI:  Pt is a 75 y.o. female seen today for medical management of chronic diseases as noted above-she appears to be enjoying a period of stability-she has no complaints today she is resting in bed comfortably I have seen her up in a walker working with therapy and appears to be gaining strength gradually which is encouraging although she still is quite frail.  She recently came here for rehabilitation after sustaining a right femoral fracture after a fall back in April 2017.  She appears to be doing well her albumin has normalized with supplements she has gained weight.  Anemia and chronic kidney disease appear to be stable most recent creatinine 0.62 back in August we will update this.  Her  weight it currently 117 pounds which appears to be up about 66 pounds over the past month to 6 weeks.  She does have a history of dementia continues on Aricept appears to be doing well on supportive care.  She did have a syncopal episode back in August that was thought to be vasovagal there is been no reoccurrence.  Regards to hypertension she appears to have somewhat variable systolics in the 130s to 140s at this point will monitor.   I do note her surgery she did develop anemia as well as thrombocytopenia with platelets down to 98,000 but this appears to have normalize most recent platelet count was 209,000 back in August-and febrile globin appears stabilized as well-was 13.1 back in August  When she was in the hospital she also had an elevated iAST but this normalized her bilirubin normalized as well liver function tests remained within normal limits back in August there was some suspicion of Gilbert's syndrome during her hospitalization  She does have a history of grade 1 diastolic dysfunction per echo-but she does not really have any significant edema I suspect her weight gain is largely appetite related  .     Past Medical History:  Diagnosis Date  . Acute encephalopathy 2012   In context of diarrhea and clinical dehydration  . Dementia    Noncompliant with Aricept as per daughter  . Protein-calorie malnutrition, severe (HCC)   . Underweight   . Vertigo    Past Surgical History:  Procedure Laterality Date  . COMPRESSION HIP SCREW Right 09/21/2015   Procedure: COMPRESSION HIP;  Surgeon: Vickki Hearing, MD;  Location: [facility];  Service: Orthopedics;  Laterality: Right;    Allergies  Allergen Reactions  . Beef-Derived Products     Current Outpatient Prescriptions on File Prior to Visit  Medication Sig Dispense Refill  . acetaminophen (TYLENOL) 325 MG tablet Take 2 tablets (650 mg total) by mouth every 6 (six) hours as needed for mild pain (or Fever >/= 101). 30 tablet 0    . aspirin EC 325 MG EC tablet Take 1 tablet (325 mg total) by mouth daily with breakfast.    . donepezil (ARICEPT) 5 MG tablet Take 5 mg by mouth at bedtime.    . ENSURE PLUS (ENSURE PLUS) LIQD Take 237 mLs by mouth daily.    Marland Kitchen. senna-docusate (SENOKOT-S) 8.6-50 MG tablet Take 1 tablet by mouth 2 (two) times daily.     No current facility-administered medications on file prior to visit.      Review of Systems    In general no complaints of fever or chills. Appears to be  gaining weight.  Skin does not complain of rashes or itching.  Head ears eyes nose mouth and throat does not complain of any visual changes or sore throat no headache  Respiratory is not complaining of any shortness of breath or cough.  Cardiac denies any chest pain or palpitations.  GI is not complaining of any nausea vomiting diarrhea or constipation.  GU no complaints of dysuria.  Musculoskeletal is not complaining of any joint pain does have some generalized weakness which is not new.  she is using a walker now at times with therapy  Neurologic is not complaining of any dizziness headache or syncopal-type feelings.-She does have a history of vertigo--no reoccurrence of her vasovagal episode which occurred back in August 2017   psych does have some history of dementia this appears to be moderate she is pleasant smiling appears to be at her baseline     Immunization History  Administered Date(s) Administered  . Influenza-Unspecified 03/20/2016  . PPD Test 06/12/2015  . Pneumococcal-Unspecified 04/02/2016   Pertinent  Health Maintenance Due  Topic Date Due  . DEXA SCAN  11/25/2016 (Originally 04/09/2004)  . PNA vac Low Risk Adult (2 of 2 - PCV13) 04/02/2017  . INFLUENZA VACCINE  Completed   No flowsheet data found. Functional Status Survey:    There were no vitals filed for this visit. There is no height or weight on file to calculate BMI. Physical Exam   In general this is a frail  elderly female in no distress resting comfortably in bed.  Her skin is warm and dry.  Eyes pupils appear reactive light sclera and conjunctiva are clear visual acuity appears grossly intact.--She has prescription lenses  Oropharynx is clear mucous membranes moist.  Chest is clear to auscultation with somewhat shallow air entry there is no labored breathing.  Heart is regular rate and rhythm with a rate in the -without murmur gallop or rub she does not have  lower extremity edema.--Pedal pulses are intact  GU could not appreciate any overt suprapubic tenderness to palpation.  Musculoskeletal does move all extremities 4 with general frailty. No deformities other than arthritic noted she does again walk with therapy using a walker  Neurologic is grossly intact her speech is clear no lateralizing findings.  Psych she is oriented to self Has confusion at times but is pleasant and appropriate during my conversation today Labs reviewed:  Recent Labs  06/09/15 0532  09/23/15 0519 12/12/15 12/12/15 0750 01/31/16 1406  NA 138  < > 138 138 138  139  K 3.7  < > 4.1  --  4.3 3.9  CL 104  < > 101  --  103 102  CO2 27  < > 29  --  30 30  GLUCOSE 115*  < > 106*  --  91 119*  BUN 15  < > 8 21 21* 18  CREATININE 0.60  < > 0.47 0.6 0.61 0.62  CALCIUM 9.0  < > 8.8*  --  9.1 9.2  MG 2.0  --   --   --   --   --   PHOS 3.5  --   --   --   --   --   < > = values in this interval not displayed.  Recent Labs  09/18/15 1932 12/12/15 0750 01/31/16 1406  AST 74* 29 33  ALT 35 30 31  ALKPHOS 77 73 67  BILITOT 1.5* 0.5 0.6  PROT 7.4 6.3* 6.7  ALBUMIN 4.7 3.5 3.9    Recent Labs  09/18/15 1932  09/24/15 0504 12/12/15 12/12/15 0750 01/31/16 1406  WBC 10.9*  < > 7.6 4.2 4.2 6.1  NEUTROABS 9.6*  --   --   --  1.8 4.9  HGB 13.6  < > 11.0*  --  11.8* 13.1  HCT 38.7  < > 31.4*  --  34.9* 39.1  MCV 91.3  < > 94.3  --  93.6 95.4  PLT 143*  < > 168  --  182 209  < > = values in this  interval not displayed. Lab Results  Component Value Date   TSH 1.066 01/31/2016   Lab Results  Component Value Date   HGBA1C 5.4 01/10/2016   No results found for: CHOL, HDL, LDLCALC, LDLDIRECT, TRIG, CHOLHDL  Significant Diagnostic Results in last 30 days:  No results found.  Assessment/Plan  :  #1-history of right femoral fracture back in April 2017-she continues on vitamin D and calcium continues on aspirin for prophylaxis this appears stable again she is using a walker at times years to slowly be gaining strength although initially her cooperation with therapy apparently was somewhat challenging.  #2 history of anemia hemoglobin is stabilized at 13.1 we will recheck a CBC thrombocytopenia also appears to have resolved and was transitory during her hospitalization.  #3 hypertension again has variable systolics as noted above at this point will continue to monitor currently on no medication.  #4 history of renal insufficiency chronic kidney disease this appears stable with a creatinine of 0.62 on lab done in August 2017 will recheck this.  #5 history of weight gain-again this appears to be appetite related she does have a history of grade 1 diastolic dysfunction but this appears stable lungs are clear no edema no complaints of shortness of breath or decreased energy from baseline at this point will monitor she is on supplements.  #6 history depression this appears stable on Zoloft nursing staff has not reported any issues.  #7 history of syncope-again this appeared to be an isolated episode back in August and thought to be vasovagal there has been no reoccurrence.  #8 history of dementia she appears to be doing well with supportive care she is on Aricept-and she is on supplements appears to be gaining weight and strength which is encouraging.  #9 history of elevated liver function tests in the past again this has normalized she does not have any complaints of abdominal pain  jaundice at this point will monitor periodically but I suspect this  was transitory in the hospital thought possibly to be Gilbert's syndrome  [phone]

## 2016-05-04 ENCOUNTER — Encounter (HOSPITAL_COMMUNITY)
Admission: RE | Admit: 2016-05-04 | Discharge: 2016-05-04 | Disposition: A | Discharge status: Home / Self Care | Payer: Commercial Managed Care - HMO | Source: Skilled Nursing Facility | Attending: Internal Medicine | Admitting: Internal Medicine

## 2016-05-04 DIAGNOSIS — F0390 Unspecified dementia without behavioral disturbance: Secondary | ICD-10-CM | POA: Insufficient documentation

## 2016-05-04 DIAGNOSIS — M6281 Muscle weakness (generalized): Secondary | ICD-10-CM | POA: Diagnosis not present

## 2016-05-04 DIAGNOSIS — Z4789 Encounter for other orthopedic aftercare: Secondary | ICD-10-CM | POA: Diagnosis not present

## 2016-05-04 DIAGNOSIS — Z9181 History of falling: Secondary | ICD-10-CM | POA: Diagnosis not present

## 2016-05-04 DIAGNOSIS — E43 Unspecified severe protein-calorie malnutrition: Secondary | ICD-10-CM | POA: Insufficient documentation

## 2016-05-04 LAB — BASIC METABOLIC PANEL
Anion gap: 5 (ref 5–15)
BUN: 17 mg/dL (ref 6–20)
CHLORIDE: 103 mmol/L (ref 101–111)
CO2: 31 mmol/L (ref 22–32)
Calcium: 9.4 mg/dL (ref 8.9–10.3)
Creatinine, Ser: 0.63 mg/dL (ref 0.44–1.00)
Glucose, Bld: 91 mg/dL (ref 65–99)
POTASSIUM: 3.6 mmol/L (ref 3.5–5.1)
Sodium: 139 mmol/L (ref 135–145)

## 2016-05-04 LAB — CBC
HCT: 41.9 % (ref 36.0–46.0)
HEMOGLOBIN: 14.1 g/dL (ref 12.0–15.0)
MCH: 32.6 pg (ref 26.0–34.0)
MCHC: 33.7 g/dL (ref 30.0–36.0)
MCV: 97 fL (ref 78.0–100.0)
Platelets: 225 10*3/uL (ref 150–400)
RBC: 4.32 MIL/uL (ref 3.87–5.11)
RDW: 12 % (ref 11.5–15.5)
WBC: 5.5 10*3/uL (ref 4.0–10.5)

## 2016-05-18 DIAGNOSIS — F419 Anxiety disorder, unspecified: Secondary | ICD-10-CM | POA: Diagnosis not present

## 2016-05-18 DIAGNOSIS — F329 Major depressive disorder, single episode, unspecified: Secondary | ICD-10-CM | POA: Diagnosis not present

## 2016-05-18 DIAGNOSIS — F0390 Unspecified dementia without behavioral disturbance: Secondary | ICD-10-CM | POA: Diagnosis not present

## 2016-06-18 DIAGNOSIS — M79674 Pain in right toe(s): Secondary | ICD-10-CM | POA: Diagnosis not present

## 2016-06-18 DIAGNOSIS — B351 Tinea unguium: Secondary | ICD-10-CM | POA: Diagnosis not present

## 2016-06-18 DIAGNOSIS — I739 Peripheral vascular disease, unspecified: Secondary | ICD-10-CM | POA: Diagnosis not present

## 2016-06-18 DIAGNOSIS — I70203 Unspecified atherosclerosis of native arteries of extremities, bilateral legs: Secondary | ICD-10-CM | POA: Diagnosis not present

## 2016-06-23 DIAGNOSIS — Z9181 History of falling: Secondary | ICD-10-CM | POA: Diagnosis not present

## 2016-06-23 DIAGNOSIS — F0390 Unspecified dementia without behavioral disturbance: Secondary | ICD-10-CM | POA: Diagnosis not present

## 2016-06-23 DIAGNOSIS — M6281 Muscle weakness (generalized): Secondary | ICD-10-CM | POA: Diagnosis not present

## 2016-06-23 DIAGNOSIS — R279 Unspecified lack of coordination: Secondary | ICD-10-CM | POA: Diagnosis not present

## 2016-06-24 DIAGNOSIS — Z9181 History of falling: Secondary | ICD-10-CM | POA: Diagnosis not present

## 2016-06-24 DIAGNOSIS — F0390 Unspecified dementia without behavioral disturbance: Secondary | ICD-10-CM | POA: Diagnosis not present

## 2016-06-24 DIAGNOSIS — M6281 Muscle weakness (generalized): Secondary | ICD-10-CM | POA: Diagnosis not present

## 2016-06-24 DIAGNOSIS — R279 Unspecified lack of coordination: Secondary | ICD-10-CM | POA: Diagnosis not present

## 2016-06-25 DIAGNOSIS — F0390 Unspecified dementia without behavioral disturbance: Secondary | ICD-10-CM | POA: Diagnosis not present

## 2016-06-25 DIAGNOSIS — Z9181 History of falling: Secondary | ICD-10-CM | POA: Diagnosis not present

## 2016-06-25 DIAGNOSIS — M6281 Muscle weakness (generalized): Secondary | ICD-10-CM | POA: Diagnosis not present

## 2016-06-25 DIAGNOSIS — R279 Unspecified lack of coordination: Secondary | ICD-10-CM | POA: Diagnosis not present

## 2016-06-26 DIAGNOSIS — M6281 Muscle weakness (generalized): Secondary | ICD-10-CM | POA: Diagnosis not present

## 2016-06-26 DIAGNOSIS — F0390 Unspecified dementia without behavioral disturbance: Secondary | ICD-10-CM | POA: Diagnosis not present

## 2016-06-26 DIAGNOSIS — Z9181 History of falling: Secondary | ICD-10-CM | POA: Diagnosis not present

## 2016-06-26 DIAGNOSIS — R279 Unspecified lack of coordination: Secondary | ICD-10-CM | POA: Diagnosis not present

## 2016-06-29 DIAGNOSIS — R279 Unspecified lack of coordination: Secondary | ICD-10-CM | POA: Diagnosis not present

## 2016-06-29 DIAGNOSIS — F0390 Unspecified dementia without behavioral disturbance: Secondary | ICD-10-CM | POA: Diagnosis not present

## 2016-06-29 DIAGNOSIS — M6281 Muscle weakness (generalized): Secondary | ICD-10-CM | POA: Diagnosis not present

## 2016-06-29 DIAGNOSIS — Z9181 History of falling: Secondary | ICD-10-CM | POA: Diagnosis not present

## 2016-06-30 DIAGNOSIS — R279 Unspecified lack of coordination: Secondary | ICD-10-CM | POA: Diagnosis not present

## 2016-06-30 DIAGNOSIS — Z9181 History of falling: Secondary | ICD-10-CM | POA: Diagnosis not present

## 2016-06-30 DIAGNOSIS — M6281 Muscle weakness (generalized): Secondary | ICD-10-CM | POA: Diagnosis not present

## 2016-06-30 DIAGNOSIS — F0390 Unspecified dementia without behavioral disturbance: Secondary | ICD-10-CM | POA: Diagnosis not present

## 2016-07-01 DIAGNOSIS — F0390 Unspecified dementia without behavioral disturbance: Secondary | ICD-10-CM | POA: Diagnosis not present

## 2016-07-01 DIAGNOSIS — M6281 Muscle weakness (generalized): Secondary | ICD-10-CM | POA: Diagnosis not present

## 2016-07-01 DIAGNOSIS — R279 Unspecified lack of coordination: Secondary | ICD-10-CM | POA: Diagnosis not present

## 2016-07-01 DIAGNOSIS — Z9181 History of falling: Secondary | ICD-10-CM | POA: Diagnosis not present

## 2016-07-07 DIAGNOSIS — M6281 Muscle weakness (generalized): Secondary | ICD-10-CM | POA: Diagnosis not present

## 2016-07-07 DIAGNOSIS — Z9181 History of falling: Secondary | ICD-10-CM | POA: Diagnosis not present

## 2016-07-07 DIAGNOSIS — R279 Unspecified lack of coordination: Secondary | ICD-10-CM | POA: Diagnosis not present

## 2016-07-07 DIAGNOSIS — F0390 Unspecified dementia without behavioral disturbance: Secondary | ICD-10-CM | POA: Diagnosis not present

## 2016-07-09 DIAGNOSIS — Z9181 History of falling: Secondary | ICD-10-CM | POA: Diagnosis not present

## 2016-07-09 DIAGNOSIS — M6281 Muscle weakness (generalized): Secondary | ICD-10-CM | POA: Diagnosis not present

## 2016-07-09 DIAGNOSIS — F0390 Unspecified dementia without behavioral disturbance: Secondary | ICD-10-CM | POA: Diagnosis not present

## 2016-07-09 DIAGNOSIS — R279 Unspecified lack of coordination: Secondary | ICD-10-CM | POA: Diagnosis not present

## 2016-07-13 DIAGNOSIS — M6281 Muscle weakness (generalized): Secondary | ICD-10-CM | POA: Diagnosis not present

## 2016-07-13 DIAGNOSIS — F0390 Unspecified dementia without behavioral disturbance: Secondary | ICD-10-CM | POA: Diagnosis not present

## 2016-07-13 DIAGNOSIS — R279 Unspecified lack of coordination: Secondary | ICD-10-CM | POA: Diagnosis not present

## 2016-07-13 DIAGNOSIS — Z9181 History of falling: Secondary | ICD-10-CM | POA: Diagnosis not present

## 2016-07-14 DIAGNOSIS — Z9181 History of falling: Secondary | ICD-10-CM | POA: Diagnosis not present

## 2016-07-14 DIAGNOSIS — R279 Unspecified lack of coordination: Secondary | ICD-10-CM | POA: Diagnosis not present

## 2016-07-14 DIAGNOSIS — F0390 Unspecified dementia without behavioral disturbance: Secondary | ICD-10-CM | POA: Diagnosis not present

## 2016-07-14 DIAGNOSIS — M6281 Muscle weakness (generalized): Secondary | ICD-10-CM | POA: Diagnosis not present

## 2016-07-22 ENCOUNTER — Encounter: Payer: Self-pay | Admitting: Internal Medicine

## 2016-07-22 ENCOUNTER — Non-Acute Institutional Stay (SKILLED_NURSING_FACILITY): Payer: Commercial Managed Care - HMO | Admitting: Internal Medicine

## 2016-07-22 DIAGNOSIS — R030 Elevated blood-pressure reading, without diagnosis of hypertension: Secondary | ICD-10-CM

## 2016-07-22 DIAGNOSIS — F0390 Unspecified dementia without behavioral disturbance: Secondary | ICD-10-CM | POA: Diagnosis not present

## 2016-07-22 DIAGNOSIS — S72141S Displaced intertrochanteric fracture of right femur, sequela: Secondary | ICD-10-CM | POA: Diagnosis not present

## 2016-07-22 NOTE — Progress Notes (Signed)
Location:   [facility] Room Number: 154/D Place of Service:  SNF (31) Provider:  Anastashia Westerfeld,Maribell Demeo  No primary care provider on file.  No care team member to display  Extended Emergency Contact Information Primary Emergency Contact: [employer] Address: [address] [place], [place] [address] D[place] of [place] Home Phone: [phone] Mobile Phone: [phone] Relation: Daughter Secondary Emergency Contact: Gena Fray [place] Mobile Phone: [phone] Relation: Son  Code Status:  Full Code Goals of care: Advanced Directive information Advanced Directives 07/22/2016  Does Patient Have a Medical Advance Directive? Yes  Type of Advance Directive (No Data)  Does patient want to make changes to medical advance directive? No - Patient declined  Copy of Healthcare Power of Attorney in Chart? -  Would patient like information on creating a medical advance directive? No - Patient declined     Chief Complaint  Patient presents with  . Medical Management of Chronic Issues    Routine Visit    HPI:  Pt is a 76 y.o. female seen today for medical management of chronic diseases.   Patient Has h/o Right Femoral Fracture secondary to fall requirng ORIF in 04/17. She has been LT resident of facility since then. She also has Dementia and depression. Her weight has actually improved to 117 lbs. She has been very stable since last routine visit and has not had active problems per staff. Patient is hard to understand sometimes. But she denied any Complains. She has been able to get around the facility with her wheelchair.     Past Medical History:  Diagnosis Date  . Acute encephalopathy 2012   In context of diarrhea and clinical dehydration  . Dementia    Noncompliant with Aricept as per daughter  . Protein-calorie malnutrition, severe (HCC)   . Underweight   . Vertigo    Past Surgical History:  Procedure Laterality  Date  . COMPRESSION HIP SCREW Right 09/21/2015   Procedure: COMPRESSION HIP;  Surgeon: Vickki Hearing, MD;  Location: [facility];  Service: Orthopedics;  Laterality: Right;    Allergies  Allergen Reactions  . Beef-Derived Products     Current Outpatient Prescriptions on File Prior to Visit  Medication Sig Dispense Refill  . acetaminophen (TYLENOL) 325 MG tablet Take 2 tablets (650 mg total) by mouth every 6 (six) hours as needed for mild pain (or Fever >/= 101). 30 tablet 0  . aspirin EC 325 MG EC tablet Take 1 tablet (325 mg total) by mouth daily with breakfast.    . calcium carbonate (OS-CAL - DOSED IN MG OF ELEMENTAL CALCIUM) 1250 (500 Ca) MG tablet Take 1 tablet by mouth 2 (two) times daily with a meal.    . donepezil (ARICEPT) 5 MG tablet Take 5 mg by mouth at bedtime.    . ENSURE PLUS (ENSURE PLUS) LIQD Take 237 mLs by mouth daily.    Marland Kitchen senna-docusate (SENOKOT-S) 8.6-50 MG tablet Take 1 tablet by mouth 2 (two) times daily.    . sertraline (ZOLOFT) 25 MG tablet Take 25 mg by mouth daily.     No current facility-administered medications on file prior to visit.     Review of Systems  Review of Systems  Constitutional: Negative for activity change, appetite change, chills, diaphoresis, fatigue and fever.  HENT: Negative for mouth sores, postnasal drip, rhinorrhea, sinus pain and sore throat.   Respiratory: Negative for apnea, cough, chest tightness, shortness of  breath and wheezing.   Cardiovascular: Negative for chest pain, palpitations and leg swelling.  Gastrointestinal: Negative for abdominal distention, abdominal pain, constipation, diarrhea, nausea and vomiting.  Genitourinary: Negative for dysuria and frequency.  Musculoskeletal: Negative for arthralgias, joint swelling and myalgias.  Skin: Negative for rash.  Neurological: Negative for dizziness, syncope, weakness, light-headedness and numbness.  Psychiatric/Behavioral: Negative for behavioral problems, confusion and sleep  disturbance.    Immunization History  Administered Date(s) Administered  . Influenza-Unspecified 03/20/2016  . PPD Test 06/12/2015  . Pneumococcal-Unspecified 04/02/2016   Pertinent  Health Maintenance Due  Topic Date Due  . DEXA SCAN  11/25/2016 (Originally 04/09/2004)  . PNA vac Low Risk Adult (2 of 2 - PCV13) 04/02/2017  . INFLUENZA VACCINE  Completed   No flowsheet data found. Functional Status Survey:    Vitals:   07/22/16 1000  BP: (!) 159/72 155/80  Pulse: (!) 103  Resp: 20  Temp: 98.2 F (36.8 C)  TempSrc: Oral   There is no height or weight on file to calculate BMI. Physical Exam  Constitutional: She appears well-developed.  HENT:  Head: Normocephalic.  Mouth/Throat: Oropharynx is clear and moist.  Eyes: Pupils are equal, round, and reactive to light.  Neck: Neck supple.  Cardiovascular: Normal rate, regular rhythm and normal heart sounds.   Pulmonary/Chest: Effort normal and breath sounds normal. No respiratory distress. She has no wheezes. She has no rales.  Abdominal: Soft. Bowel sounds are normal. There is no tenderness. There is no rebound.  Musculoskeletal: She exhibits no edema.  Lymphadenopathy:    She has no cervical adenopathy.  Neurological: She is alert.  Skin: Skin is warm and dry.  Psychiatric: She has a normal mood and affect. Her speech is delayed. She exhibits abnormal recent memory.    Labs reviewed:  Recent Labs  12/12/15 0750 01/31/16 1406 05/04/16 0700  NA 138 139 139  K 4.3 3.9 3.6  CL 103 102 103  CO2 30 30 31   GLUCOSE 91 119* 91  BUN 21* 18 17  CREATININE 0.61 0.62 0.63  CALCIUM 9.1 9.2 9.4    Recent Labs  09/18/15 1932 12/12/15 0750 01/31/16 1406  AST 74* 29 33  ALT 35 30 31  ALKPHOS 77 73 67  BILITOT 1.5* 0.5 0.6  PROT 7.4 6.3* 6.7  ALBUMIN 4.7 3.5 3.9    Recent Labs  09/18/15 1932  12/12/15 0750 01/31/16 1406 05/04/16 0700  WBC 10.9*  < > 4.2 6.1 5.5  NEUTROABS 9.6*  --  1.8 4.9  --   HGB 13.6  <  > 11.8* 13.1 14.1  HCT 38.7  < > 34.9* 39.1 41.9  MCV 91.3  < > 93.6 95.4 97.0  PLT 143*  < > 182 209 225  < > = values in this interval not displayed. Lab Results  Component Value Date   TSH 1.066 01/31/2016   Lab Results  Component Value Date   HGBA1C 5.4 01/10/2016   No results found for: CHOL, HDL, LDLCALC, LDLDIRECT, TRIG, CHOLHDL  Significant Diagnostic Results in last 30 days:  No results found.  Assessment/Plan  S/P Femoral Fracture.  Patient has recovered well from this.  Will get DEXA scan on her. For osteoporosis risk stratification. Also check Vit D level.  Elevated BP Her other readings in the Matrix are systolic b/w 120-130 Will Check BP every day for few days to evaluate for Hypertension. Not on any Meds right now.  Dementia Stable on Aricept. Also on Low dose  Zoloft.    Family/ staff Communication:   Labs/tests ordered:   CBC, CMP, Vit D level, DEXA scan

## 2016-07-23 ENCOUNTER — Encounter (HOSPITAL_COMMUNITY)
Admission: RE | Admit: 2016-07-23 | Discharge: 2016-07-23 | Disposition: A | Discharge status: Home / Self Care | Payer: Medicare Other | Source: Skilled Nursing Facility | Attending: Internal Medicine | Admitting: Internal Medicine

## 2016-07-23 DIAGNOSIS — F0390 Unspecified dementia without behavioral disturbance: Secondary | ICD-10-CM | POA: Insufficient documentation

## 2016-07-23 DIAGNOSIS — F3289 Other specified depressive episodes: Secondary | ICD-10-CM | POA: Diagnosis not present

## 2016-07-23 DIAGNOSIS — M6281 Muscle weakness (generalized): Secondary | ICD-10-CM | POA: Diagnosis not present

## 2016-07-23 DIAGNOSIS — E43 Unspecified severe protein-calorie malnutrition: Secondary | ICD-10-CM | POA: Diagnosis not present

## 2016-07-23 DIAGNOSIS — Z4789 Encounter for other orthopedic aftercare: Secondary | ICD-10-CM | POA: Diagnosis not present

## 2016-07-23 DIAGNOSIS — Z9181 History of falling: Secondary | ICD-10-CM | POA: Insufficient documentation

## 2016-07-23 DIAGNOSIS — R279 Unspecified lack of coordination: Secondary | ICD-10-CM | POA: Diagnosis not present

## 2016-07-23 LAB — CBC
HEMATOCRIT: 39.3 % (ref 36.0–46.0)
HEMOGLOBIN: 13.5 g/dL (ref 12.0–15.0)
MCH: 32.8 pg (ref 26.0–34.0)
MCHC: 34.4 g/dL (ref 30.0–36.0)
MCV: 95.4 fL (ref 78.0–100.0)
Platelets: 193 10*3/uL (ref 150–400)
RBC: 4.12 MIL/uL (ref 3.87–5.11)
RDW: 12.1 % (ref 11.5–15.5)
WBC: 4.7 10*3/uL (ref 4.0–10.5)

## 2016-07-23 LAB — COMPREHENSIVE METABOLIC PANEL
ALBUMIN: 3.5 g/dL (ref 3.5–5.0)
ALK PHOS: 54 U/L (ref 38–126)
ALT: 27 U/L (ref 14–54)
ANION GAP: 6 (ref 5–15)
AST: 32 U/L (ref 15–41)
BILIRUBIN TOTAL: 0.5 mg/dL (ref 0.3–1.2)
BUN: 18 mg/dL (ref 6–20)
CO2: 30 mmol/L (ref 22–32)
Calcium: 9 mg/dL (ref 8.9–10.3)
Chloride: 101 mmol/L (ref 101–111)
Creatinine, Ser: 0.61 mg/dL (ref 0.44–1.00)
GFR calc Af Amer: >60 mL/min (ref >60)
GFR calc non Af Amer: >60 mL/min (ref >60)
GLUCOSE: 97 mg/dL (ref 65–99)
POTASSIUM: 4.1 mmol/L (ref 3.5–5.1)
SODIUM: 137 mmol/L (ref 135–145)
TOTAL PROTEIN: 6.1 g/dL — AB (ref 6.5–8.1)

## 2016-07-24 LAB — VITAMIN D 25 HYDROXY (VIT D DEFICIENCY, FRACTURES): Vit D, 25-Hydroxy: 21.9 ng/mL — ABNORMAL LOW (ref 30.0–100.0)

## 2016-07-28 DIAGNOSIS — M810 Age-related osteoporosis without current pathological fracture: Secondary | ICD-10-CM | POA: Diagnosis not present

## 2016-07-30 ENCOUNTER — Other Ambulatory Visit (HOSPITAL_COMMUNITY): Payer: Medicare Other

## 2016-08-24 ENCOUNTER — Encounter: Payer: Self-pay | Admitting: Internal Medicine

## 2016-08-24 ENCOUNTER — Non-Acute Institutional Stay (SKILLED_NURSING_FACILITY): Payer: Medicare Other | Admitting: Internal Medicine

## 2016-08-24 DIAGNOSIS — S72141S Displaced intertrochanteric fracture of right femur, sequela: Secondary | ICD-10-CM

## 2016-08-24 DIAGNOSIS — F0390 Unspecified dementia without behavioral disturbance: Secondary | ICD-10-CM

## 2016-08-24 DIAGNOSIS — F32A Depression, unspecified: Secondary | ICD-10-CM

## 2016-08-24 DIAGNOSIS — I10 Essential (primary) hypertension: Secondary | ICD-10-CM

## 2016-08-24 DIAGNOSIS — F329 Major depressive disorder, single episode, unspecified: Secondary | ICD-10-CM

## 2016-08-24 DIAGNOSIS — E559 Vitamin D deficiency, unspecified: Secondary | ICD-10-CM | POA: Insufficient documentation

## 2016-08-24 NOTE — Progress Notes (Signed)
Location:   [facility] Room Number: 154/D Place of Service:  SNF (31) Provider:  Arlo,Lassen  No primary care provider on file.  No care team member to display  Extended Emergency Contact Information Primary Emergency Contact: [facility] Address: [address] [place], [place] [address] [place] of [place] Home Phone: [phone] Mobile Phone: [phone] Relation: Daughter Secondary Emergency Contact: Gena Fray [place] of [place] Mobile Phone: [phone] Relation: Son  Code Status:  Full Code Goals of care: Advanced Directive information Advanced Directives 08/24/2016  Does Patient Have a Medical Advance Directive? Yes  Type of Advance Directive (No Data)  Does patient want to make changes to medical advance directive? No - Patient declined  Copy of Healthcare Power of Attorney in Chart? -  Would patient like information on creating a medical advance directive? No - Patient declined     Chief Complaint  Patient presents with  . Medical Management of Chronic Issues    Routine Visit  For medical management of dementia-history of hip fracture-depression-constipation-question hypertension-  HPI:  Pt is a 76 y.o. female seen today for medical management of chronic diseases.  As noted above. She has a history of a right femoral fracture secondary to a fall that required ORIF back in April 2017.  She's been a long-term resident of the facility since then.  She also has a history of dementia and depression.  Her stay here recently as been quite unremarkable weight appears to be stable at 116.4 pounds.  Nursing staff has not reported any recent acute issues.  I note blood pressure apparently taken earlier today is 140/79-I took it manually and got 138/64 this afternoon.  Dr. Chales Abrahams did note at times she has some elevated blood pressures and ordered more frequent blood pressure readings although those records are  somewhat difficult to assess the ones I was able to see showed a systolic of 148/79 also blood pressure 144/88-I also see 1 listed as 128/70 so appears to have some variability will reorder more frequent blood pressure checks to keep an eye on this she is not on any medications. I did get 138/64 manually this afternoon  Regards to dementia she is on low-dose Aricept 5 mg a day she appears to be doing well with supportive care with a stable weight and appears a fairly decent appetite.  She also had a vitamin D level done at was low at 21.9 in early February I know we she's been on this for some time however  Regards to her right femoral fracture   With repairshe appears again to be doing well with supportive care she is on calcium with vitamin D she is not complaining of any pain       Past Medical History:  Diagnosis Date  . Acute encephalopathy 2012   In context of diarrhea and clinical dehydration  . Dementia    Noncompliant with Aricept as per daughter  . Protein-calorie malnutrition, severe (HCC)   . Underweight   . Vertigo    Past Surgical History:  Procedure Laterality Date  . COMPRESSION HIP SCREW Right 09/21/2015   Procedure: COMPRESSION HIP;  Surgeon: Vickki Hearing, MD;  Location: [facility];  Service: Orthopedics;  Laterality: Right;    Allergies  Allergen Reactions  . Beef-Derived Products     Current Outpatient Prescriptions on File Prior to Visit  Medication Sig Dispense Refill  . acetaminophen (TYLENOL) 325 MG tablet Take  2 tablets (650 mg total) by mouth every 6 (six) hours as needed for mild pain (or Fever >/= 101). 30 tablet 0  . aspirin EC 325 MG EC tablet Take 1 tablet (325 mg total) by mouth daily with breakfast.    . calcium carbonate (OS-CAL - DOSED IN MG OF ELEMENTAL CALCIUM) 1250 (500 Ca) MG tablet Take 1 tablet by mouth 2 (two) times daily with a meal.    . donepezil (ARICEPT) 5 MG tablet Take 5 mg by mouth at bedtime.    . ENSURE PLUS (ENSURE PLUS)  LIQD Take 237 mLs by mouth daily.    . ondansetron (ZOFRAN) 4 MG tablet Take 4 mg by mouth every 6 (six) hours as needed for nausea or vomiting.    . senna-docusate (SENOKOT-S) 8.6-50 MG tablet Take 1 tablet by mouth 2 (two) times daily.    . sertraline (ZOLOFT) 25 MG tablet Take 25 mg by mouth daily.     No current facility-administered medications on file prior to visit.      Review of Systems this is somewhat limited with history of dysarthria  In general she is not complaining fever chills weight appears to be stable.  Skin is not complaining of any itching or rashes.  Head ears eyes nose mouth and throat has no complaints of sore throat.  Cardiac does not complaining of any chest pain it does not really appear to have significant lower extremity edema.  GI is not complaining of abdominal discomfort.  Muscle skeletal does not complain of joint pain.  Neurologic does not complain of headache dizziness nursing staff has not noted issues here.  Psych does have a history of depression and dementia but she appears to be pleasant and at baseline--nursing staff does not report any acute changes or signs of depression   Visit exam.  Temp 98.4 pulse 84 respirations 20 blood pressure taken manually 138/64-earlier today was 140/79 weight is stable at 116.4 pounds.  In general this is a somewhat frail elderly female in no distress lying comfortably in bed she is bright alert smiling.  Her skin is warm and dry.  Eyes sclera and icteric clear visual acuity appears grossly intact she does have a history of prescription lenses.  Oropharynx is clear mucous membranes moist.  Chest is clear to auscultation there is no labored breathing.  Heart is regular rate and rhythm without murmur gallop or rub she does not really have significant lower extremity edema-pedal pulses are intact bilaterally.  Muscle skeletal does move all extremities 4 does have general frailty-she does have arthritic  changes but I do not note any overt deformities.  Neurologic is grossly intact cranial nerves appear grossly intact she has somewhat dysarthric speech which is baseline.  Psych she is oriented to self is pleasant and follows simple verbal commands without difficulty continues to be pleasant smiling    Immunization History  Administered Date(s) Administered  . Influenza-Unspecified 03/20/2016  . PPD Test 06/12/2015  . Pneumococcal-Unspecified 04/02/2016   Pertinent  Health Maintenance Due  Topic Date Due  . DEXA SCAN  11/25/2016 (Originally 04/09/2004)  . PNA vac Low Risk Adult (2 of 2 - PCV13) 04/02/2017  . INFLUENZA VACCINE  Completed   No flowsheet data found. Functional Status Survey:   Labs reviewed:  Recent Labs  01/31/16 1406 05/04/16 0700 07/23/16 0700  NA 139 139 137  K 3.9 3.6 4.1  CL 102 103 101  CO2 30 31 30   GLUCOSE 119* 91 97  BUN 18 17 18   CREATININE 0.62 0.63 0.61  CALCIUM 9.2 9.4 9.0    Recent Labs  12/12/15 0750 01/31/16 1406 07/23/16 0700  AST 29 33 32  ALT 30 31 27   ALKPHOS 73 67 54  BILITOT 0.5 0.6 0.5  PROT 6.3* 6.7 6.1*  ALBUMIN 3.5 3.9 3.5    Recent Labs  09/18/15 1932  12/12/15 0750 01/31/16 1406 05/04/16 0700 07/23/16 0700  WBC 10.9*  < > 4.2 6.1 5.5 4.7  NEUTROABS 9.6*  --  1.8 4.9  --   --   HGB 13.6  < > 11.8* 13.1 14.1 13.5  HCT 38.7  < > 34.9* 39.1 41.9 39.3  MCV 91.3  < > 93.6 95.4 97.0 95.4  PLT 143*  < > 182 209 225 193  < > = values in this interval not displayed. Lab Results  Component Value Date   TSH 1.066 01/31/2016   Lab Results  Component Value Date   HGBA1C 5.4 01/10/2016   No results found for: CHOL, HDL, LDLCALC, LDLDIRECT, TRIG, CHOLHDL  Significant Diagnostic Results in last 30 days:  No results found.  Assessment/Plan  #1-history of femoral fracture on the right status post all RIF-this was in April 2017 she appears to be stable in this regards with supportive care continues to have  weakness which is not new she is on calcium with vitamin D.  #2 history of dementia this appears stable this appears to be moderate she is on Aricept 10 mg day does well with supportive care weight has been stable.  #3 question hypertension-again she has some variable blood pressure readings as noted above will order blood pressure checks daily with a log for review next week.  #4-history of vitamin D deficiency again vitamin D level was 21.9 in early February will supplement vitamin D 6000 units daily 8 weeks-will update a vitamin D level in 4 weeks  #5 history constipation she is on Senokot apparently this has not been an issue recently.  #6 history of depression this appears stable on Zoloft she continues to be pleasant smiling has some dysarthria which makes her somewhat difficult to understand but she expresses no complaints today  Of note since she will be getting a vitamin D level in 4 weeks will update her CBC and cMP as well -- these have been stable for an extended period of time.  [id number]

## 2016-09-04 ENCOUNTER — Encounter (HOSPITAL_COMMUNITY)
Admission: RE | Admit: 2016-09-04 | Discharge: 2016-09-04 | Disposition: A | Discharge status: Home / Self Care | Payer: Medicare Other | Source: Skilled Nursing Facility | Attending: Internal Medicine | Admitting: Internal Medicine

## 2016-09-04 DIAGNOSIS — E43 Unspecified severe protein-calorie malnutrition: Secondary | ICD-10-CM | POA: Insufficient documentation

## 2016-09-04 DIAGNOSIS — Z9181 History of falling: Secondary | ICD-10-CM | POA: Insufficient documentation

## 2016-09-04 DIAGNOSIS — F3289 Other specified depressive episodes: Secondary | ICD-10-CM | POA: Insufficient documentation

## 2016-09-04 DIAGNOSIS — Z4789 Encounter for other orthopedic aftercare: Secondary | ICD-10-CM | POA: Insufficient documentation

## 2016-09-05 LAB — VITAMIN D 25 HYDROXY (VIT D DEFICIENCY, FRACTURES): VIT D 25 HYDROXY: 26.7 ng/mL — AB (ref 30.0–100.0)

## 2016-09-22 ENCOUNTER — Encounter (HOSPITAL_COMMUNITY)
Admission: RE | Admit: 2016-09-22 | Discharge: 2016-09-22 | Disposition: A | Discharge status: Home / Self Care | Payer: Medicare Other | Source: Skilled Nursing Facility | Attending: Internal Medicine | Admitting: Internal Medicine

## 2016-09-22 DIAGNOSIS — F3281 Premenstrual dysphoric disorder: Secondary | ICD-10-CM | POA: Insufficient documentation

## 2016-09-22 DIAGNOSIS — F064 Anxiety disorder due to known physiological condition: Secondary | ICD-10-CM | POA: Insufficient documentation

## 2016-09-22 DIAGNOSIS — F0390 Unspecified dementia without behavioral disturbance: Secondary | ICD-10-CM | POA: Insufficient documentation

## 2016-09-22 DIAGNOSIS — M6281 Muscle weakness (generalized): Secondary | ICD-10-CM | POA: Diagnosis not present

## 2016-09-22 DIAGNOSIS — Z4789 Encounter for other orthopedic aftercare: Secondary | ICD-10-CM | POA: Insufficient documentation

## 2016-09-22 LAB — COMPREHENSIVE METABOLIC PANEL
ALBUMIN: 3.9 g/dL (ref 3.5–5.0)
ALK PHOS: 64 U/L (ref 38–126)
ALT: 19 U/L (ref 14–54)
ANION GAP: 6 (ref 5–15)
AST: 24 U/L (ref 15–41)
BUN: 19 mg/dL (ref 6–20)
CALCIUM: 9.5 mg/dL (ref 8.9–10.3)
CHLORIDE: 100 mmol/L — AB (ref 101–111)
CO2: 32 mmol/L (ref 22–32)
Creatinine, Ser: 0.68 mg/dL (ref 0.44–1.00)
GFR calc non Af Amer: >60 mL/min (ref >60)
GLUCOSE: 129 mg/dL — AB (ref 65–99)
POTASSIUM: 3.9 mmol/L (ref 3.5–5.1)
SODIUM: 138 mmol/L (ref 135–145)
Total Bilirubin: 0.7 mg/dL (ref 0.3–1.2)
Total Protein: 7.2 g/dL (ref 6.5–8.1)

## 2016-09-22 LAB — CBC WITH DIFFERENTIAL/PLATELET
BASOS PCT: 1 %
Basophils Absolute: 0 10*3/uL (ref 0.0–0.1)
EOS ABS: 0.3 10*3/uL (ref 0.0–0.7)
EOS PCT: 7 %
HCT: 43.1 % (ref 36.0–46.0)
HEMOGLOBIN: 14.3 g/dL (ref 12.0–15.0)
Lymphocytes Relative: 33 %
Lymphs Abs: 1.3 10*3/uL (ref 0.7–4.0)
MCH: 32.4 pg (ref 26.0–34.0)
MCHC: 33.2 g/dL (ref 30.0–36.0)
MCV: 97.7 fL (ref 78.0–100.0)
MONOS PCT: 6 %
Monocytes Absolute: 0.3 10*3/uL (ref 0.1–1.0)
NEUTROS PCT: 53 %
Neutro Abs: 2.2 10*3/uL (ref 1.7–7.7)
PLATELETS: 205 10*3/uL (ref 150–400)
RBC: 4.41 MIL/uL (ref 3.87–5.11)
RDW: 12.2 % (ref 11.5–15.5)
WBC: 4.1 10*3/uL (ref 4.0–10.5)

## 2016-09-23 LAB — VITAMIN D 25 HYDROXY (VIT D DEFICIENCY, FRACTURES): Vit D, 25-Hydroxy: 22.6 ng/mL — ABNORMAL LOW (ref 30.0–100.0)

## 2016-10-01 ENCOUNTER — Non-Acute Institutional Stay (SKILLED_NURSING_FACILITY): Payer: Medicare Other | Admitting: Internal Medicine

## 2016-10-01 ENCOUNTER — Encounter: Payer: Self-pay | Admitting: Internal Medicine

## 2016-10-01 DIAGNOSIS — F0390 Unspecified dementia without behavioral disturbance: Secondary | ICD-10-CM | POA: Diagnosis not present

## 2016-10-01 DIAGNOSIS — S72141S Displaced intertrochanteric fracture of right femur, sequela: Secondary | ICD-10-CM

## 2016-10-01 DIAGNOSIS — F339 Major depressive disorder, recurrent, unspecified: Secondary | ICD-10-CM

## 2016-10-01 NOTE — Progress Notes (Signed)
Location:   [facility] Room Number: 154/D Place of Service:  SNF [phone]) Provider:  Pollyann Savoy, MD  Patient Care Team: Mahlon Gammon, MD as PCP - General (Internal Medicine) Roena Malady, PA-C as Physician Assistant (Internal Medicine)  Extended Emergency Contact Information Primary Emergency Contact: [facility] Address: [address] [place], [place] [address] [place] of [place] Home Phone: [phone] Mobile Phone: [phone] Relation: Daughter Secondary Emergency Contact: Gena Fray [place] Mobile Phone: [phone] Relation: Son  Code Status:  Full Code Goals of care: Advanced Directive information Advanced Directives 10/01/2016  Does Patient Have a Medical Advance Directive? Yes  Type of Advance Directive (No Data)  Does patient want to make changes to medical advance directive? No - Patient declined  Copy of Healthcare Power of Attorney in Chart? -  Would patient like information on creating a medical advance directive? No - Patient declined     Chief Complaint  Patient presents with  . Medical Management of Chronic Issues    Routine Visit    HPI:  Pt is a 76 y.o. female seen today for medical management of chronic diseases.   Patient is Long term Resident of facility. She has been here since she sustained Right Femoral Fracture due to Fall and required ORIF. She also has h/o Depression and Dementia. She has been stable in the facility since last routine visit. She denies any complain. She gets around the facility with her Wheel Chair. Patient has slowly Gained Weight at 122  lbs now  from 117 Lbs Past Medical History:  Diagnosis Date  . Acute encephalopathy 2012   In context of diarrhea and clinical dehydration  . Dementia    Noncompliant with Aricept as per daughter  . Protein-calorie malnutrition, severe (HCC)   . Underweight   . Vertigo    Past Surgical History:    Procedure Laterality Date  . COMPRESSION HIP SCREW Right 09/21/2015   Procedure: COMPRESSION HIP;  Surgeon: Vickki Hearing, MD;  Location: [facility];  Service: Orthopedics;  Laterality: Right;    Allergies  Allergen Reactions  . Beef-Derived Products     Outpatient Encounter Prescriptions as of 10/01/2016  Medication Sig  . acetaminophen (TYLENOL) 325 MG tablet Take 2 tablets (650 mg total) by mouth every 6 (six) hours as needed for mild pain (or Fever >/= 101).  Marland Kitchen aspirin EC 325 MG EC tablet Take 1 tablet (325 mg total) by mouth daily with breakfast.  . calcium carbonate (OS-CAL - DOSED IN MG OF ELEMENTAL CALCIUM) 1250 (500 Ca) MG tablet Take 1 tablet by mouth 2 (two) times daily with a meal.  . Cholecalciferol 1000 units TBDP Take 1,000 Units by mouth daily.  Marland Kitchen donepezil (ARICEPT) 5 MG tablet Take 5 mg by mouth at bedtime.  . ENSURE PLUS (ENSURE PLUS) LIQD Take 237 mLs by mouth daily.  . ondansetron (ZOFRAN) 4 MG tablet Take 4 mg by mouth every 6 (six) hours as needed for nausea or vomiting.  . senna-docusate (SENOKOT-S) 8.6-50 MG tablet Take 1 tablet by mouth 2 (two) times daily.  . sertraline (ZOLOFT) 25 MG tablet Take 25 mg by mouth daily.   No facility-administered encounter medications on file as of 10/01/2016.      Review of Systems  Reason unable to perform ROS: Not reliable due to dementia.  Review of Systems  Constitutional: Negative for activity change, appetite change,  chills, diaphoresis, fatigue and fever.  HENT: Negative for mouth sores, postnasal drip, rhinorrhea, sinus pain and sore throat.   Respiratory: Negative for apnea, cough, chest tightness, shortness of breath and wheezing.   Cardiovascular: Negative for chest pain, palpitations and leg swelling.  Gastrointestinal: Negative for abdominal distention, abdominal pain, constipation, diarrhea, nausea and vomiting.  Genitourinary: Negative for dysuria and frequency.  Musculoskeletal: Negative for arthralgias,  joint swelling and myalgias.  Skin: Negative for rash.  Neurological: Negative for dizziness, syncope, weakness, light-headedness and numbness.  Psychiatric/Behavioral: Negative for behavioral problems, confusion and sleep disturbance.     Immunization History  Administered Date(s) Administered  . Influenza-Unspecified 03/20/2016  . PPD Test 06/12/2015  . Pneumococcal-Unspecified 04/02/2016   Pertinent  Health Maintenance Due  Topic Date Due  . DEXA SCAN  11/25/2016 (Originally 04/09/2004)  . INFLUENZA VACCINE  01/20/2017  . PNA vac Low Risk Adult (2 of 2 - PCV13) 04/02/2017   No flowsheet data found. Functional Status Survey:    Vitals:   10/01/16 1239  BP: 120/70  Pulse: 76  Resp: 20  Temp: 98.2 F (36.8 C)  TempSrc: Oral   There is no height or weight on file to calculate BMI. Physical Exam  Constitutional: She appears well-developed and well-nourished.  HENT:  Head: Normocephalic.  Mouth/Throat: Oropharynx is clear and moist.  Eyes: Pupils are equal, round, and reactive to light.  Neck: Neck supple.  Cardiovascular: Normal rate, regular rhythm and normal heart sounds.   Pulmonary/Chest: Effort normal and breath sounds normal. No respiratory distress. She has no wheezes. She has no rales.  Abdominal: Soft. Bowel sounds are normal. She exhibits no distension. There is no tenderness. There is no rebound.  Musculoskeletal: She exhibits no edema.  Neurological: She is alert.  Not oriented . Moves all her extremities with no Deficit  Skin: Skin is warm and dry.  Psychiatric: She has a normal mood and affect. Her behavior is normal. Thought content normal.    Labs reviewed:  Recent Labs  05/04/16 0700 07/23/16 0700 09/22/16 0700  NA 139 137 138  K 3.6 4.1 3.9  CL 103 101 100*  CO2 31 30 32  GLUCOSE 91 97 129*  BUN CREATININE 0.63 0.61 0.68  CALCIUM 9.4 9.0 9.5    Recent Labs  01/31/16 1406 07/23/16 0700 09/22/16 0700  AST 33 32 24  ALT  ALKPHOS 67 54 64  BILITOT 0.6 0.5 0.7  PROT 6.7 6.1* 7.2  ALBUMIN 3.9 3.5 3.9    Recent Labs  12/12/15 0750 01/31/16 1406 05/04/16 0700 07/23/16 0700 09/22/16 0700  WBC 4.2 6.1 5.5 4.7 4.1  NEUTROABS 1.8 4.9  --   --  2.2  HGB 11.8* 13.1 14.1 13.5 14.3  HCT 34.9* 39.1 41.9 39.3 43.1  MCV 93.6 95.4 97.0 95.4 97.7  PLT 182 209 225 193 205   Lab Results  Component Value Date   TSH 1.066 01/31/2016   Lab Results  Component Value Date   HGBA1C 5.4 01/10/2016   No results found for: CHOL, HDL, LDLCALC, LDLDIRECT, TRIG, CHOLHDL  Significant Diagnostic Results in last 30 days:  No results found.  Assessment/Plan  S/P Femoral Fracture Patient doing well. He DEXA scan showed T score 2.25 but with her h/o Fracture she will be good candidate for Biphosphate. Will D/W family. Already on Vit D  Also will repeat Vit D and Calcium before starting therapy.  Dementia without behavioral disturbance, Stable on  Aricept Continue Supportive care  Depression Stable on Zoloft.    Family/ staff Communication:   Labs/tests ordered:

## 2016-10-04 DIAGNOSIS — F339 Major depressive disorder, recurrent, unspecified: Secondary | ICD-10-CM | POA: Insufficient documentation

## 2016-10-13 DIAGNOSIS — H2513 Age-related nuclear cataract, bilateral: Secondary | ICD-10-CM | POA: Diagnosis not present

## 2016-10-19 DIAGNOSIS — F329 Major depressive disorder, single episode, unspecified: Secondary | ICD-10-CM | POA: Diagnosis not present

## 2016-10-19 DIAGNOSIS — F0390 Unspecified dementia without behavioral disturbance: Secondary | ICD-10-CM | POA: Diagnosis not present

## 2016-10-19 DIAGNOSIS — F419 Anxiety disorder, unspecified: Secondary | ICD-10-CM | POA: Diagnosis not present

## 2016-10-20 ENCOUNTER — Encounter: Payer: Self-pay | Admitting: Internal Medicine

## 2016-10-20 ENCOUNTER — Non-Acute Institutional Stay (SKILLED_NURSING_FACILITY): Payer: Medicare Other | Admitting: Internal Medicine

## 2016-10-20 DIAGNOSIS — F0390 Unspecified dementia without behavioral disturbance: Secondary | ICD-10-CM

## 2016-10-20 DIAGNOSIS — R220 Localized swelling, mass and lump, head: Secondary | ICD-10-CM | POA: Diagnosis not present

## 2016-10-20 NOTE — Progress Notes (Addendum)
Location:   [facility] Room Number: 154/D Place of Service:  SNF [phone]) Provider:  Pollyann Savoy, MD  Patient Care Team: Mahlon Gammon, MD as PCP - General (Internal Medicine) Roena Malady, PA-C as Physician Assistant (Internal Medicine)  Extended Emergency Contact Information Primary Emergency Contact: [facility] Address: [address] [place], [place] [address] [place] of [place] Home Phone: [phone] Mobile Phone: [phone] Relation: Daughter Secondary Emergency Contact: Gena Fray [place] Mobile Phone: [phone] Relation: Son  Code Status:  Full Code Goals of care: Advanced Directive information Advanced Directives 10/20/2016  Does Patient Have a Medical Advance Directive? Yes  Type of Advance Directive (No Data)  Does patient want to make changes to medical advance directive? No - Patient declined  Copy of Healthcare Power of Attorney in Chart? -  Would patient like information on creating a medical advance directive? No - Patient declined     Chief Complaint  Patient presents with  . Acute Visit    Swollen tounge and upper lip    HPI:  Pt is a 76 y.o. female seen today for an acute visit for Swelling of her Lip.  Patient has h/o Dementia and depression Last night nurse noticed that her lips was swollen and her tongue was swollen. Patient unable to give any history. According to Nurse this morning there has not been anything new regarding patients Including any new  Medicine. Patient does not have any new rashes. No SOB or Cough. She is eating and drinking well.   Past Medical History:  Diagnosis Date  . Acute encephalopathy 2012   In context of diarrhea and clinical dehydration  . Dementia    Noncompliant with Aricept as per daughter  . Protein-calorie malnutrition, severe (HCC)   . Underweight   . Vertigo    Past Surgical History:  Procedure Laterality Date  .  COMPRESSION HIP SCREW Right 09/21/2015   Procedure: COMPRESSION HIP;  Surgeon: Vickki Hearing, MD;  Location: [facility];  Service: Orthopedics;  Laterality: Right;    Allergies  Allergen Reactions  . Beef-Derived Products     Outpatient Encounter Prescriptions as of 10/20/2016  Medication Sig  . acetaminophen (TYLENOL) 325 MG tablet Take 2 tablets (650 mg total) by mouth every 6 (six) hours as needed for mild pain (or Fever >/= 101).  Marland Kitchen aspirin EC 325 MG EC tablet Take 1 tablet (325 mg total) by mouth daily with breakfast.  . calcium carbonate (OS-CAL - DOSED IN MG OF ELEMENTAL CALCIUM) 1250 (500 Ca) MG tablet Take 1 tablet by mouth 2 (two) times daily with a meal.  . Cholecalciferol 1000 units TBDP Take 1,000 Units by mouth daily.  Marland Kitchen donepezil (ARICEPT) 5 MG tablet Take 5 mg by mouth at bedtime.  . ondansetron (ZOFRAN) 4 MG tablet Take 4 mg by mouth every 6 (six) hours as needed for nausea or vomiting.  . senna-docusate (SENOKOT-S) 8.6-50 MG tablet Take 1 tablet by mouth 2 (two) times daily.  . sertraline (ZOLOFT) 25 MG tablet Take 25 mg by mouth daily.  . [DISCONTINUED] ENSURE PLUS (ENSURE PLUS) LIQD Take 237 mLs by mouth daily.   No facility-administered encounter medications on file as of 10/20/2016.      Review of Systems  Unable to perform ROS: Dementia    Immunization History  Administered Date(s) Administered  . Influenza-Unspecified 03/20/2016  . PPD Test 06/12/2015  .  Pneumococcal-Unspecified 04/02/2016   Pertinent  Health Maintenance Due  Topic Date Due  . DEXA SCAN  11/25/2016 (Originally 04/09/2004)  . INFLUENZA VACCINE  01/20/2017  . PNA vac Low Risk Adult (2 of 2 - PCV13) 04/02/2017   No flowsheet data found. Functional Status Survey:    Vitals:   10/20/16 1231  BP: 123/80  Pulse: 68  Resp: 19  Temp: 97.8 F (36.6 C)  TempSrc: Oral  SpO2: 94%   There is no height or weight on file to calculate BMI. Physical Exam  Constitutional: She appears  well-developed and well-nourished.  HENT:  Head: Normocephalic.  Mouth/Throat: Oropharynx is clear and moist.  Patient does have swelling in her Upper Lip. Tongue is not swollen.  Cardiovascular: Normal rate and regular rhythm.   No murmur heard. Pulmonary/Chest: Effort normal and breath sounds normal. She has no wheezes.  Musculoskeletal: She exhibits no edema.  Skin: Skin is warm and dry. No rash noted. No erythema. No pallor.    Labs reviewed:  Recent Labs  05/04/16 0700 07/23/16 0700 09/22/16 0700  NA 139 137 138  K 3.6 4.1 3.9  CL 103 101 100*  CO2 31 30 32  GLUCOSE 91 97 129*  BUN CREATININE 0.63 0.61 0.68  CALCIUM 9.4 9.0 9.5    Recent Labs  01/31/16 1406 07/23/16 0700 09/22/16 0700  AST 33 32 24  ALT ALKPHOS 67 54 64  BILITOT 0.6 0.5 0.7  PROT 6.7 6.1* 7.2  ALBUMIN 3.9 3.5 3.9    Recent Labs  12/12/15 0750 01/31/16 1406 05/04/16 0700 07/23/16 0700 09/22/16 0700  WBC 4.2 6.1 5.5 4.7 4.1  NEUTROABS 1.8 4.9  --   --  2.2  HGB 11.8* 13.1 14.1 13.5 14.3  HCT 34.9* 39.1 41.9 39.3 43.1  MCV 93.6 95.4 97.0 95.4 97.7  PLT 182 209 225 193 205   Lab Results  Component Value Date   TSH 1.066 01/31/2016   Lab Results  Component Value Date   HGBA1C 5.4 01/10/2016   No results found for: CHOL, HDL, LDLCALC, LDLDIRECT, TRIG, CHOLHDL  Significant Diagnostic Results in last 30 days:  No results found.  Assessment/Plan Swelling of upper Lip  Not sure the cause yet. She is not Ace inhibitor. ? Something she ate. Thre is her allergies listed as beef derived Products. No new Meds added recently. Though patient has not had this before in facility. Will try Prednisone 10 mg for 5 days. She did get Benadryl last Nite.  I went to see her again and her swelling is much better. Will try to flag the kitchen for her allergy.   Family/ staff Communication:   Labs/tests ordered:

## 2016-10-27 DIAGNOSIS — B351 Tinea unguium: Secondary | ICD-10-CM | POA: Diagnosis not present

## 2016-10-27 DIAGNOSIS — M6281 Muscle weakness (generalized): Secondary | ICD-10-CM | POA: Diagnosis not present

## 2016-10-27 DIAGNOSIS — I739 Peripheral vascular disease, unspecified: Secondary | ICD-10-CM | POA: Diagnosis not present

## 2016-11-05 NOTE — Progress Notes (Signed)
This encounter was created in error - please disregard.

## 2016-11-06 ENCOUNTER — Non-Acute Institutional Stay (SKILLED_NURSING_FACILITY): Payer: Medicare Other | Admitting: Internal Medicine

## 2016-11-06 ENCOUNTER — Encounter: Payer: Self-pay | Admitting: Internal Medicine

## 2016-11-06 DIAGNOSIS — K5900 Constipation, unspecified: Secondary | ICD-10-CM

## 2016-11-06 DIAGNOSIS — E559 Vitamin D deficiency, unspecified: Secondary | ICD-10-CM | POA: Diagnosis not present

## 2016-11-06 DIAGNOSIS — F0390 Unspecified dementia without behavioral disturbance: Secondary | ICD-10-CM | POA: Diagnosis not present

## 2016-11-06 DIAGNOSIS — S72141S Displaced intertrochanteric fracture of right femur, sequela: Secondary | ICD-10-CM | POA: Diagnosis not present

## 2016-11-06 NOTE — Progress Notes (Signed)
Location:    Nursing Home Room Number: 154/D Place of Service:  SNF (31) Provider:  Edmon CrapeArlo  Jamayia Croker PA-C  Mahlon GammonGupta, Anjali L, MD  Patient Care Team: Mahlon GammonGupta, Anjali L, MD as PCP - General (Internal Medicine) Roena MaladyLassen, Toshika Parrow C, PA-C as Physician Assistant (Internal Medicine)  Extended Emergency Contact Information Primary Emergency Contact: [facility] Address: [address]          [place], [place] [address] [place] Home Phone: [phone] Mobile Phone: [phone] Relation: Daughter Secondary Emergency Contact: Gena Frayurner,Clark  [place] Mobile Phone: [phone] Relation: Son   Goals of care: Advanced Directive information Advanced Directives 11/06/2016  Does Patient Have a Medical Advance Directive? Yes  Type of Advance Directive (No Data)  Does patient want to make changes to medical advance directive? No - Patient declined  Copy of Healthcare Power of Attorney in Chart? -  Would patient like information on creating a medical advance directive? No - Patient declined     Chief Complaint  Patient presents with  . Medical Management of Chronic Issues    Routine Visit   Medical management of chronic medical issues including dementia-history of femoral neck fracture on the right-constipation-apparently vitamin D deficiency  HPI:  Pt is a 76 y.o. female seen today for medical management of chronic diseases.  As noted above.  Nursing staff does not report any issues today she was seen earlier this month her some lip and tongue swelling unknown etiology possibly allergy to beef derived products which was previously noted.  There's been an order to avoid these products in the future.  She did receive a course of low-dose prednisone for 5 days in the edema of her lip and tongue apparently resolved she did not actually show any signs of distress or discomfort during this.  She does have a history dementia appears to be doing well with  supportive care recent widow 119.4 pounds appears relatively stable apparently had been 122 pounds last month but it was 117 pounds previous month  Lab work done actually month ago appear to be fairly unremarkable hemoglobin was 14.3 renal function appears stable with creatinine 0.68 and BUN of 19.  Vitamin D level was low at 22.6 at one point she had been on supplementation but I do not see that she is currently on any.  Currently she is lying in bed comfortably with no complaints she does have confusion and is a poor historian blood pressure appears stable at 111/72 she is afebrile.     Past Medical History:  Diagnosis Date  . Acute encephalopathy 2012   In context of diarrhea and clinical dehydration  . Dementia    Noncompliant with Aricept as per daughter  . Protein-calorie malnutrition, severe (HCC)   . Underweight   . Vertigo    Past Surgical History:  Procedure Laterality Date  . COMPRESSION HIP SCREW Right 09/21/2015   Procedure: COMPRESSION HIP;  Surgeon: Vickki HearingStanley E Harrison, MD;  Location: [facility];  Service: Orthopedics;  Laterality: Right;    Allergies  Allergen Reactions  . Beef-Derived Products     Allergies as of 11/06/2016      Reactions   Beef-derived Products       Medication List    Notice   This visit is during an admission. Changes to the med list made in this visit will be reflected in the After Visit Summary of the admission.     Review of Systems Unattainable secondary to dementia nursing staff  does not report any issues   Immunization History  Administered Date(s) Administered  . Influenza-Unspecified 03/20/2016  . PPD Test 06/12/2015  . Pneumococcal-Unspecified 04/02/2016   Pertinent  Health Maintenance Due  Topic Date Due  . DEXA SCAN  11/25/2016 (Originally 04/09/2004)  . INFLUENZA VACCINE  01/20/2017  . PNA vac Low Risk Adult (2 of 2 - PCV13) 04/02/2017   No flowsheet data found. Functional Status Survey:    Vitals:   11/06/16  1449  BP: 111/72  Pulse: 86  Resp: 18  Temp: 97.9 F (36.6 C)  TempSrc: Oral  SpO2: 97%   Last noted weight 122 pounds will try to update this Physical Exam   In general this is a somewhat frail elderly female in no distress lying comfortably in bed she is bright alert smiling.--Will speak some when  addressed but does not talk much  Her skin is warm and dry.  Eyes sclera and icteric clear visual acuity appears grossly intact she does have a history of prescription lenses.  Oropharynx is clear mucous membranes moist. I do not really note any significant edema of her tongue or lips this evening  Chest is clear to auscultation there is no labored breathing.  Heart is regular rate and rhythm without murmur gallop or rub she does not really have significant lower extremity edema-pedal pulses are intact bilaterally.  Muscle skeletal does move all extremities 4 does have general frailty-she does have arthritic changes but I do not note any overt deformities.  Neurologic is grossly intact cranial nerves appear grossly intact she has somewhat dysarthric speech which is baseline.  Psych she is oriented to self is pleasant and follows simple verbal commands without difficulty continues to be pleasant smiling  Labs reviewed:  Recent Labs  05/04/16 0700 07/23/16 0700 09/22/16 0700  NA 139 137 138  K 3.6 4.1 3.9  CL 103 101 100*  CO2 31 30 32  GLUCOSE 91 97 129*  BUN 17 18 19   CREATININE 0.63 0.61 0.68  CALCIUM 9.4 9.0 9.5    Recent Labs  01/31/16 1406 07/23/16 0700 09/22/16 0700  AST 33 32 24  ALT 31 27 19   ALKPHOS 67 54 64  BILITOT 0.6 0.5 0.7  PROT 6.7 6.1* 7.2  ALBUMIN 3.9 3.5 3.9    Recent Labs  12/12/15 0750 01/31/16 1406 05/04/16 0700 07/23/16 0700 09/22/16 0700  WBC 4.2 6.1 5.5 4.7 4.1  NEUTROABS 1.8 4.9  --   --  2.2  HGB 11.8* 13.1 14.1 13.5 14.3  HCT 34.9* 39.1 41.9 39.3 43.1  MCV 93.6 95.4 97.0 95.4 97.7  PLT 182 209 225 193 205   Lab  Results  Component Value Date   TSH 1.066 01/31/2016   Lab Results  Component Value Date   HGBA1C 5.4 01/10/2016   No results found for: CHOL, HDL, LDLCALC, LDLDIRECT, TRIG, CHOLHDL  Significant Diagnostic Results in last 30 days:  No results found.  Assessment/Plan  #1-history of femoral fracture on the right status post ORIF-this appears stable this was done in April 2017.  She is on calcium vitamin D level is low will supplement with vitamin D 50,000 units every week for 8 weeks and update a vitamin D level then.  #2 history dementia she appears to be doing well with Aricept as well as supportive care weight appears relatively stable at 119.4 pounds  #3 history of constipation continues on Senokot apparently this is effective.  #4 history depression? At one point she been  on Zoloft I do not see she is on this anymore apparently there has been a dose titration off it nonetheless she appears to be stable nursing does not really report any issues.  #5 history of lip and tongue swelling-this appears resolved she did receive a short course of prednisone-I see per orderse list  recommendations to avoid beef derived products  [phone]

## 2016-11-17 DIAGNOSIS — R1312 Dysphagia, oropharyngeal phase: Secondary | ICD-10-CM | POA: Diagnosis not present

## 2016-11-18 DIAGNOSIS — R1312 Dysphagia, oropharyngeal phase: Secondary | ICD-10-CM | POA: Diagnosis not present

## 2016-11-19 DIAGNOSIS — R1312 Dysphagia, oropharyngeal phase: Secondary | ICD-10-CM | POA: Diagnosis not present

## 2016-11-20 DIAGNOSIS — F0390 Unspecified dementia without behavioral disturbance: Secondary | ICD-10-CM | POA: Diagnosis not present

## 2016-11-20 DIAGNOSIS — R279 Unspecified lack of coordination: Secondary | ICD-10-CM | POA: Diagnosis not present

## 2016-11-20 DIAGNOSIS — F329 Major depressive disorder, single episode, unspecified: Secondary | ICD-10-CM | POA: Diagnosis not present

## 2016-11-20 DIAGNOSIS — R1312 Dysphagia, oropharyngeal phase: Secondary | ICD-10-CM | POA: Diagnosis not present

## 2016-11-20 DIAGNOSIS — M6281 Muscle weakness (generalized): Secondary | ICD-10-CM | POA: Diagnosis not present

## 2016-11-20 DIAGNOSIS — R293 Abnormal posture: Secondary | ICD-10-CM | POA: Diagnosis not present

## 2016-11-20 DIAGNOSIS — F419 Anxiety disorder, unspecified: Secondary | ICD-10-CM | POA: Diagnosis not present

## 2016-11-23 DIAGNOSIS — M6281 Muscle weakness (generalized): Secondary | ICD-10-CM | POA: Diagnosis not present

## 2016-11-23 DIAGNOSIS — R1312 Dysphagia, oropharyngeal phase: Secondary | ICD-10-CM | POA: Diagnosis not present

## 2016-11-23 DIAGNOSIS — R279 Unspecified lack of coordination: Secondary | ICD-10-CM | POA: Diagnosis not present

## 2016-11-23 DIAGNOSIS — R293 Abnormal posture: Secondary | ICD-10-CM | POA: Diagnosis not present

## 2016-11-23 DIAGNOSIS — F0390 Unspecified dementia without behavioral disturbance: Secondary | ICD-10-CM | POA: Diagnosis not present

## 2016-11-24 DIAGNOSIS — M6281 Muscle weakness (generalized): Secondary | ICD-10-CM | POA: Diagnosis not present

## 2016-11-24 DIAGNOSIS — F0390 Unspecified dementia without behavioral disturbance: Secondary | ICD-10-CM | POA: Diagnosis not present

## 2016-11-24 DIAGNOSIS — R279 Unspecified lack of coordination: Secondary | ICD-10-CM | POA: Diagnosis not present

## 2016-11-24 DIAGNOSIS — R1312 Dysphagia, oropharyngeal phase: Secondary | ICD-10-CM | POA: Diagnosis not present

## 2016-11-24 DIAGNOSIS — R293 Abnormal posture: Secondary | ICD-10-CM | POA: Diagnosis not present

## 2016-11-25 DIAGNOSIS — M6281 Muscle weakness (generalized): Secondary | ICD-10-CM | POA: Diagnosis not present

## 2016-11-25 DIAGNOSIS — F0390 Unspecified dementia without behavioral disturbance: Secondary | ICD-10-CM | POA: Diagnosis not present

## 2016-11-25 DIAGNOSIS — R1312 Dysphagia, oropharyngeal phase: Secondary | ICD-10-CM | POA: Diagnosis not present

## 2016-11-25 DIAGNOSIS — R293 Abnormal posture: Secondary | ICD-10-CM | POA: Diagnosis not present

## 2016-11-25 DIAGNOSIS — R279 Unspecified lack of coordination: Secondary | ICD-10-CM | POA: Diagnosis not present

## 2016-11-26 DIAGNOSIS — R1312 Dysphagia, oropharyngeal phase: Secondary | ICD-10-CM | POA: Diagnosis not present

## 2016-11-26 DIAGNOSIS — F0390 Unspecified dementia without behavioral disturbance: Secondary | ICD-10-CM | POA: Diagnosis not present

## 2016-11-26 DIAGNOSIS — R279 Unspecified lack of coordination: Secondary | ICD-10-CM | POA: Diagnosis not present

## 2016-11-26 DIAGNOSIS — R293 Abnormal posture: Secondary | ICD-10-CM | POA: Diagnosis not present

## 2016-11-26 DIAGNOSIS — M6281 Muscle weakness (generalized): Secondary | ICD-10-CM | POA: Diagnosis not present

## 2016-11-27 DIAGNOSIS — R293 Abnormal posture: Secondary | ICD-10-CM | POA: Diagnosis not present

## 2016-11-27 DIAGNOSIS — R1312 Dysphagia, oropharyngeal phase: Secondary | ICD-10-CM | POA: Diagnosis not present

## 2016-11-27 DIAGNOSIS — M6281 Muscle weakness (generalized): Secondary | ICD-10-CM | POA: Diagnosis not present

## 2016-11-27 DIAGNOSIS — F0390 Unspecified dementia without behavioral disturbance: Secondary | ICD-10-CM | POA: Diagnosis not present

## 2016-11-27 DIAGNOSIS — R279 Unspecified lack of coordination: Secondary | ICD-10-CM | POA: Diagnosis not present

## 2016-11-30 DIAGNOSIS — R293 Abnormal posture: Secondary | ICD-10-CM | POA: Diagnosis not present

## 2016-11-30 DIAGNOSIS — M6281 Muscle weakness (generalized): Secondary | ICD-10-CM | POA: Diagnosis not present

## 2016-11-30 DIAGNOSIS — R1312 Dysphagia, oropharyngeal phase: Secondary | ICD-10-CM | POA: Diagnosis not present

## 2016-11-30 DIAGNOSIS — R279 Unspecified lack of coordination: Secondary | ICD-10-CM | POA: Diagnosis not present

## 2016-11-30 DIAGNOSIS — F0390 Unspecified dementia without behavioral disturbance: Secondary | ICD-10-CM | POA: Diagnosis not present

## 2016-12-01 DIAGNOSIS — F0390 Unspecified dementia without behavioral disturbance: Secondary | ICD-10-CM | POA: Diagnosis not present

## 2016-12-01 DIAGNOSIS — M6281 Muscle weakness (generalized): Secondary | ICD-10-CM | POA: Diagnosis not present

## 2016-12-01 DIAGNOSIS — R279 Unspecified lack of coordination: Secondary | ICD-10-CM | POA: Diagnosis not present

## 2016-12-01 DIAGNOSIS — R293 Abnormal posture: Secondary | ICD-10-CM | POA: Diagnosis not present

## 2016-12-01 DIAGNOSIS — R1312 Dysphagia, oropharyngeal phase: Secondary | ICD-10-CM | POA: Diagnosis not present

## 2016-12-02 DIAGNOSIS — F0390 Unspecified dementia without behavioral disturbance: Secondary | ICD-10-CM | POA: Diagnosis not present

## 2016-12-02 DIAGNOSIS — M6281 Muscle weakness (generalized): Secondary | ICD-10-CM | POA: Diagnosis not present

## 2016-12-02 DIAGNOSIS — R293 Abnormal posture: Secondary | ICD-10-CM | POA: Diagnosis not present

## 2016-12-02 DIAGNOSIS — R1312 Dysphagia, oropharyngeal phase: Secondary | ICD-10-CM | POA: Diagnosis not present

## 2016-12-02 DIAGNOSIS — R279 Unspecified lack of coordination: Secondary | ICD-10-CM | POA: Diagnosis not present

## 2016-12-03 DIAGNOSIS — F0390 Unspecified dementia without behavioral disturbance: Secondary | ICD-10-CM | POA: Diagnosis not present

## 2016-12-03 DIAGNOSIS — M6281 Muscle weakness (generalized): Secondary | ICD-10-CM | POA: Diagnosis not present

## 2016-12-03 DIAGNOSIS — R1312 Dysphagia, oropharyngeal phase: Secondary | ICD-10-CM | POA: Diagnosis not present

## 2016-12-03 DIAGNOSIS — R279 Unspecified lack of coordination: Secondary | ICD-10-CM | POA: Diagnosis not present

## 2016-12-03 DIAGNOSIS — R293 Abnormal posture: Secondary | ICD-10-CM | POA: Diagnosis not present

## 2016-12-04 DIAGNOSIS — M6281 Muscle weakness (generalized): Secondary | ICD-10-CM | POA: Diagnosis not present

## 2016-12-04 DIAGNOSIS — R293 Abnormal posture: Secondary | ICD-10-CM | POA: Diagnosis not present

## 2016-12-04 DIAGNOSIS — F0390 Unspecified dementia without behavioral disturbance: Secondary | ICD-10-CM | POA: Diagnosis not present

## 2016-12-04 DIAGNOSIS — R1312 Dysphagia, oropharyngeal phase: Secondary | ICD-10-CM | POA: Diagnosis not present

## 2016-12-04 DIAGNOSIS — R279 Unspecified lack of coordination: Secondary | ICD-10-CM | POA: Diagnosis not present

## 2016-12-07 DIAGNOSIS — R293 Abnormal posture: Secondary | ICD-10-CM | POA: Diagnosis not present

## 2016-12-07 DIAGNOSIS — F0390 Unspecified dementia without behavioral disturbance: Secondary | ICD-10-CM | POA: Diagnosis not present

## 2016-12-07 DIAGNOSIS — R1312 Dysphagia, oropharyngeal phase: Secondary | ICD-10-CM | POA: Diagnosis not present

## 2016-12-07 DIAGNOSIS — M6281 Muscle weakness (generalized): Secondary | ICD-10-CM | POA: Diagnosis not present

## 2016-12-07 DIAGNOSIS — R279 Unspecified lack of coordination: Secondary | ICD-10-CM | POA: Diagnosis not present

## 2016-12-08 DIAGNOSIS — R1312 Dysphagia, oropharyngeal phase: Secondary | ICD-10-CM | POA: Diagnosis not present

## 2016-12-08 DIAGNOSIS — F0390 Unspecified dementia without behavioral disturbance: Secondary | ICD-10-CM | POA: Diagnosis not present

## 2016-12-08 DIAGNOSIS — R293 Abnormal posture: Secondary | ICD-10-CM | POA: Diagnosis not present

## 2016-12-08 DIAGNOSIS — R279 Unspecified lack of coordination: Secondary | ICD-10-CM | POA: Diagnosis not present

## 2016-12-08 DIAGNOSIS — M6281 Muscle weakness (generalized): Secondary | ICD-10-CM | POA: Diagnosis not present

## 2016-12-09 DIAGNOSIS — F0390 Unspecified dementia without behavioral disturbance: Secondary | ICD-10-CM | POA: Diagnosis not present

## 2016-12-09 DIAGNOSIS — R279 Unspecified lack of coordination: Secondary | ICD-10-CM | POA: Diagnosis not present

## 2016-12-09 DIAGNOSIS — R293 Abnormal posture: Secondary | ICD-10-CM | POA: Diagnosis not present

## 2016-12-09 DIAGNOSIS — M6281 Muscle weakness (generalized): Secondary | ICD-10-CM | POA: Diagnosis not present

## 2016-12-09 DIAGNOSIS — R1312 Dysphagia, oropharyngeal phase: Secondary | ICD-10-CM | POA: Diagnosis not present

## 2016-12-10 ENCOUNTER — Encounter: Payer: Self-pay | Admitting: Internal Medicine

## 2016-12-10 ENCOUNTER — Non-Acute Institutional Stay (SKILLED_NURSING_FACILITY): Payer: Medicare Other | Admitting: Internal Medicine

## 2016-12-10 DIAGNOSIS — E559 Vitamin D deficiency, unspecified: Secondary | ICD-10-CM | POA: Diagnosis not present

## 2016-12-10 DIAGNOSIS — F339 Major depressive disorder, recurrent, unspecified: Secondary | ICD-10-CM | POA: Diagnosis not present

## 2016-12-10 DIAGNOSIS — R293 Abnormal posture: Secondary | ICD-10-CM | POA: Diagnosis not present

## 2016-12-10 DIAGNOSIS — R1312 Dysphagia, oropharyngeal phase: Secondary | ICD-10-CM | POA: Diagnosis not present

## 2016-12-10 DIAGNOSIS — F0390 Unspecified dementia without behavioral disturbance: Secondary | ICD-10-CM | POA: Diagnosis not present

## 2016-12-10 DIAGNOSIS — M6281 Muscle weakness (generalized): Secondary | ICD-10-CM | POA: Diagnosis not present

## 2016-12-10 DIAGNOSIS — R279 Unspecified lack of coordination: Secondary | ICD-10-CM | POA: Diagnosis not present

## 2016-12-10 NOTE — Progress Notes (Signed)
Location:   [facility] Room Number: 154/D Place of Service:  SNF [phone](31) Provider:  Lenon CurtAnjali,Kieley Akter  Jaimin Krupka, Freddie BreechAnjali L, MD  Patient Care Team: Mahlon GammonGupta, Diann Bangerter L, MD as PCP - General (Internal Medicine) Roena MaladyLassen, Arlo C, PA-C as Physician Assistant (Internal Medicine)  Extended Emergency Contact Information Primary Emergency Contact: [facility] Address: [address]          [place], [place] [address] [place] Home Phone: [phone] Mobile Phone: [phone] Relation: Daughter Secondary Emergency Contact: Gena Frayurner,Clark  [place] Mobile Phone: [phone] Relation: Son  Code Status:  Full Code Goals of care: Advanced Directive information Advanced Directives 12/10/2016  Does Patient Have a Medical Advance Directive? Yes  Type of Advance Directive (No Data)  Does patient want to make changes to medical advance directive? No - Patient declined  Copy of Healthcare Power of Attorney in Chart? -  Would patient like information on creating a medical advance directive? No - Patient declined     Chief Complaint  Patient presents with  . Medical Management of Chronic Issues    Routine Visit    HPI:  Pt is a 76 y.o. female seen today for medical management of chronic diseases.    Patient is long term resident of Facility. She has h/o Right Femoral Fracture due to fall, Depression and dementia. She has been stable since last Routine visit. No New nursing issues. Her weight is anywhere b/w119-122 lbs. Patient has become more interactive recntly and answers me with simple yes or no. Past Medical History:  Diagnosis Date  . Acute encephalopathy 2012   In context of diarrhea and clinical dehydration  . Dementia    Noncompliant with Aricept as per daughter  . Protein-calorie malnutrition, severe (HCC)   . Underweight   . Vertigo    Past Surgical History:  Procedure Laterality Date  . COMPRESSION HIP SCREW Right 09/21/2015   Procedure: COMPRESSION HIP;  Surgeon: Vickki HearingStanley E Harrison, MD;  Location: [facility];  Service: Orthopedics;  Laterality: Right;    Allergies  Allergen Reactions  . Beef-Derived Products     Outpatient Encounter Prescriptions as of 12/10/2016  Medication Sig  . acetaminophen (TYLENOL) 325 MG tablet Take 2 tablets (650 mg total) by mouth every 6 (six) hours as needed for mild pain (or Fever >/= 101).  Marland Kitchen. aspirin EC 325 MG EC tablet Take 1 tablet (325 mg total) by mouth daily with breakfast.  . calcium carbonate (OS-CAL - DOSED IN MG OF ELEMENTAL CALCIUM) 1250 (500 Ca) MG tablet Take 1 tablet by mouth 2 (two) times daily with a meal.  . Cholecalciferol 50000 units TABS Take 50,000 Units by mouth daily. To be taken on Monday  . donepezil (ARICEPT) 5 MG tablet Take 5 mg by mouth at bedtime.  . ondansetron (ZOFRAN) 4 MG tablet Take 4 mg by mouth every 6 (six) hours as needed for nausea or vomiting.  . senna-docusate (SENOKOT-S) 8.6-50 MG tablet Take 1 tablet by mouth 2 (two) times daily.  . sertraline (ZOLOFT) 25 MG tablet Take 25 mg by mouth daily.  Marland Kitchen. triamcinolone (KENALOG) 0.025 % cream Apply 1 application topically 2 (two) times daily.   No facility-administered encounter medications on file as of 12/10/2016.      Review of Systems  Unable to perform ROS: Dementia    Immunization History  Administered Date(s) Administered  . Influenza-Unspecified 03/20/2016  . PPD Test 06/12/2015  . Pneumococcal-Unspecified 04/02/2016   Pertinent  Health Maintenance  Due  Topic Date Due  . DEXA SCAN  04/09/2004  . INFLUENZA VACCINE  01/20/2017  . PNA vac Low Risk Adult (2 of 2 - PCV13) 04/02/2017   No flowsheet data found. Functional Status Survey:    Vitals:   12/10/16 0815  BP: 132/78  Pulse: 82  Resp: 18  Temp: 98.2 F (36.8 C)  TempSrc: Oral  Weight: 121 lb 3.2 oz (55 kg)  Height: 5\' 2"  (1.575 m)   Body mass index is 22.17 kg/m. Physical Exam  Constitutional: She appears  well-developed and well-nourished.  HENT:  Head: Normocephalic.  Eyes: Pupils are equal, round, and reactive to light.  Neck: Neck supple.  Cardiovascular: Normal rate, regular rhythm and normal heart sounds.   Pulmonary/Chest: Effort normal and breath sounds normal. No respiratory distress. She has no wheezes. She has no rales.  Abdominal: Bowel sounds are normal. She exhibits no distension. There is no tenderness. There is no rebound.  Musculoskeletal: She exhibits no edema.  Neurological: She is alert.  Skin: Skin is warm and dry.  Psychiatric: She has a normal mood and affect. Her behavior is normal.    Labs reviewed:  Recent Labs  05/04/16 0700 07/23/16 0700 09/22/16 0700  NA 139 137 138  K 3.6 4.1 3.9  CL 103 101 100*  CO2 31 30 32  GLUCOSE 91 97 129*  BUN 17 18 19   CREATININE 0.63 0.61 0.68  CALCIUM 9.4 9.0 9.5    Recent Labs  01/31/16 1406 07/23/16 0700 09/22/16 0700  AST 33 32 24  ALT 31 27 19   ALKPHOS 67 54 64  BILITOT 0.6 0.5 0.7  PROT 6.7 6.1* 7.2  ALBUMIN 3.9 3.5 3.9    Recent Labs  12/12/15 0750 01/31/16 1406 05/04/16 0700 07/23/16 0700 09/22/16 0700  WBC 4.2 6.1 5.5 4.7 4.1  NEUTROABS 1.8 4.9  --   --  2.2  HGB 11.8* 13.1 14.1 13.5 14.3  HCT 34.9* 39.1 41.9 39.3 43.1  MCV 93.6 95.4 97.0 95.4 97.7  PLT 182 209 225 193 205   Lab Results  Component Value Date   TSH 1.066 01/31/2016   Lab Results  Component Value Date   HGBA1C 5.4 01/10/2016   No results found for: CHOL, HDL, LDLCALC, LDLDIRECT, TRIG, CHOLHDL  Significant Diagnostic Results in last 30 days:  No results found.  Assessment/Plan Vitamin D deficiency Continue Vit D supplement  Depression Doing well on Zoloft. Continue her on same dose  Dementia  Continue Aricept.  Family/ staff Communication:   Labs/tests ordered:

## 2016-12-11 DIAGNOSIS — R293 Abnormal posture: Secondary | ICD-10-CM | POA: Diagnosis not present

## 2016-12-11 DIAGNOSIS — F0390 Unspecified dementia without behavioral disturbance: Secondary | ICD-10-CM | POA: Diagnosis not present

## 2016-12-11 DIAGNOSIS — M6281 Muscle weakness (generalized): Secondary | ICD-10-CM | POA: Diagnosis not present

## 2016-12-11 DIAGNOSIS — R279 Unspecified lack of coordination: Secondary | ICD-10-CM | POA: Diagnosis not present

## 2016-12-11 DIAGNOSIS — R1312 Dysphagia, oropharyngeal phase: Secondary | ICD-10-CM | POA: Diagnosis not present

## 2016-12-14 DIAGNOSIS — R279 Unspecified lack of coordination: Secondary | ICD-10-CM | POA: Diagnosis not present

## 2016-12-14 DIAGNOSIS — F0390 Unspecified dementia without behavioral disturbance: Secondary | ICD-10-CM | POA: Diagnosis not present

## 2016-12-14 DIAGNOSIS — R1312 Dysphagia, oropharyngeal phase: Secondary | ICD-10-CM | POA: Diagnosis not present

## 2016-12-14 DIAGNOSIS — M6281 Muscle weakness (generalized): Secondary | ICD-10-CM | POA: Diagnosis not present

## 2016-12-14 DIAGNOSIS — R293 Abnormal posture: Secondary | ICD-10-CM | POA: Diagnosis not present

## 2016-12-15 DIAGNOSIS — R279 Unspecified lack of coordination: Secondary | ICD-10-CM | POA: Diagnosis not present

## 2016-12-15 DIAGNOSIS — F0390 Unspecified dementia without behavioral disturbance: Secondary | ICD-10-CM | POA: Diagnosis not present

## 2016-12-15 DIAGNOSIS — R1312 Dysphagia, oropharyngeal phase: Secondary | ICD-10-CM | POA: Diagnosis not present

## 2016-12-15 DIAGNOSIS — M6281 Muscle weakness (generalized): Secondary | ICD-10-CM | POA: Diagnosis not present

## 2016-12-15 DIAGNOSIS — R293 Abnormal posture: Secondary | ICD-10-CM | POA: Diagnosis not present

## 2016-12-16 DIAGNOSIS — R293 Abnormal posture: Secondary | ICD-10-CM | POA: Diagnosis not present

## 2016-12-16 DIAGNOSIS — F0390 Unspecified dementia without behavioral disturbance: Secondary | ICD-10-CM | POA: Diagnosis not present

## 2016-12-16 DIAGNOSIS — R279 Unspecified lack of coordination: Secondary | ICD-10-CM | POA: Diagnosis not present

## 2016-12-16 DIAGNOSIS — M6281 Muscle weakness (generalized): Secondary | ICD-10-CM | POA: Diagnosis not present

## 2016-12-16 DIAGNOSIS — R1312 Dysphagia, oropharyngeal phase: Secondary | ICD-10-CM | POA: Diagnosis not present

## 2016-12-17 DIAGNOSIS — R279 Unspecified lack of coordination: Secondary | ICD-10-CM | POA: Diagnosis not present

## 2016-12-17 DIAGNOSIS — R1312 Dysphagia, oropharyngeal phase: Secondary | ICD-10-CM | POA: Diagnosis not present

## 2016-12-17 DIAGNOSIS — F0390 Unspecified dementia without behavioral disturbance: Secondary | ICD-10-CM | POA: Diagnosis not present

## 2016-12-17 DIAGNOSIS — R293 Abnormal posture: Secondary | ICD-10-CM | POA: Diagnosis not present

## 2016-12-17 DIAGNOSIS — M6281 Muscle weakness (generalized): Secondary | ICD-10-CM | POA: Diagnosis not present

## 2016-12-18 DIAGNOSIS — M6281 Muscle weakness (generalized): Secondary | ICD-10-CM | POA: Diagnosis not present

## 2016-12-18 DIAGNOSIS — R293 Abnormal posture: Secondary | ICD-10-CM | POA: Diagnosis not present

## 2016-12-18 DIAGNOSIS — F0390 Unspecified dementia without behavioral disturbance: Secondary | ICD-10-CM | POA: Diagnosis not present

## 2016-12-18 DIAGNOSIS — R1312 Dysphagia, oropharyngeal phase: Secondary | ICD-10-CM | POA: Diagnosis not present

## 2016-12-18 DIAGNOSIS — R279 Unspecified lack of coordination: Secondary | ICD-10-CM | POA: Diagnosis not present

## 2016-12-19 DIAGNOSIS — R1312 Dysphagia, oropharyngeal phase: Secondary | ICD-10-CM | POA: Diagnosis not present

## 2016-12-19 DIAGNOSIS — F0390 Unspecified dementia without behavioral disturbance: Secondary | ICD-10-CM | POA: Diagnosis not present

## 2016-12-19 DIAGNOSIS — R293 Abnormal posture: Secondary | ICD-10-CM | POA: Diagnosis not present

## 2016-12-19 DIAGNOSIS — R279 Unspecified lack of coordination: Secondary | ICD-10-CM | POA: Diagnosis not present

## 2016-12-19 DIAGNOSIS — M6281 Muscle weakness (generalized): Secondary | ICD-10-CM | POA: Diagnosis not present

## 2016-12-21 DIAGNOSIS — M6281 Muscle weakness (generalized): Secondary | ICD-10-CM | POA: Diagnosis not present

## 2016-12-21 DIAGNOSIS — F0390 Unspecified dementia without behavioral disturbance: Secondary | ICD-10-CM | POA: Diagnosis not present

## 2016-12-21 DIAGNOSIS — R1312 Dysphagia, oropharyngeal phase: Secondary | ICD-10-CM | POA: Diagnosis not present

## 2016-12-21 DIAGNOSIS — R293 Abnormal posture: Secondary | ICD-10-CM | POA: Diagnosis not present

## 2016-12-21 DIAGNOSIS — R279 Unspecified lack of coordination: Secondary | ICD-10-CM | POA: Diagnosis not present

## 2016-12-22 DIAGNOSIS — F0390 Unspecified dementia without behavioral disturbance: Secondary | ICD-10-CM | POA: Diagnosis not present

## 2016-12-22 DIAGNOSIS — R279 Unspecified lack of coordination: Secondary | ICD-10-CM | POA: Diagnosis not present

## 2016-12-22 DIAGNOSIS — R293 Abnormal posture: Secondary | ICD-10-CM | POA: Diagnosis not present

## 2016-12-22 DIAGNOSIS — R1312 Dysphagia, oropharyngeal phase: Secondary | ICD-10-CM | POA: Diagnosis not present

## 2016-12-22 DIAGNOSIS — M6281 Muscle weakness (generalized): Secondary | ICD-10-CM | POA: Diagnosis not present

## 2016-12-23 DIAGNOSIS — R293 Abnormal posture: Secondary | ICD-10-CM | POA: Diagnosis not present

## 2016-12-23 DIAGNOSIS — R1312 Dysphagia, oropharyngeal phase: Secondary | ICD-10-CM | POA: Diagnosis not present

## 2016-12-23 DIAGNOSIS — M6281 Muscle weakness (generalized): Secondary | ICD-10-CM | POA: Diagnosis not present

## 2016-12-23 DIAGNOSIS — R279 Unspecified lack of coordination: Secondary | ICD-10-CM | POA: Diagnosis not present

## 2016-12-23 DIAGNOSIS — F0390 Unspecified dementia without behavioral disturbance: Secondary | ICD-10-CM | POA: Diagnosis not present

## 2016-12-24 DIAGNOSIS — F0390 Unspecified dementia without behavioral disturbance: Secondary | ICD-10-CM | POA: Diagnosis not present

## 2016-12-24 DIAGNOSIS — R293 Abnormal posture: Secondary | ICD-10-CM | POA: Diagnosis not present

## 2016-12-24 DIAGNOSIS — R279 Unspecified lack of coordination: Secondary | ICD-10-CM | POA: Diagnosis not present

## 2016-12-24 DIAGNOSIS — R1312 Dysphagia, oropharyngeal phase: Secondary | ICD-10-CM | POA: Diagnosis not present

## 2016-12-24 DIAGNOSIS — M6281 Muscle weakness (generalized): Secondary | ICD-10-CM | POA: Diagnosis not present

## 2016-12-25 ENCOUNTER — Other Ambulatory Visit (HOSPITAL_COMMUNITY)
Admission: RE | Admit: 2016-12-25 | Discharge: 2016-12-25 | Disposition: A | Discharge status: Home / Self Care | Payer: Medicare Other | Source: Skilled Nursing Facility | Attending: Internal Medicine | Admitting: Internal Medicine

## 2016-12-25 DIAGNOSIS — M6281 Muscle weakness (generalized): Secondary | ICD-10-CM | POA: Diagnosis not present

## 2016-12-25 DIAGNOSIS — R279 Unspecified lack of coordination: Secondary | ICD-10-CM | POA: Diagnosis not present

## 2016-12-25 DIAGNOSIS — E559 Vitamin D deficiency, unspecified: Secondary | ICD-10-CM | POA: Diagnosis not present

## 2016-12-25 DIAGNOSIS — F0390 Unspecified dementia without behavioral disturbance: Secondary | ICD-10-CM | POA: Diagnosis not present

## 2016-12-25 DIAGNOSIS — R293 Abnormal posture: Secondary | ICD-10-CM | POA: Diagnosis not present

## 2016-12-25 DIAGNOSIS — R1312 Dysphagia, oropharyngeal phase: Secondary | ICD-10-CM | POA: Diagnosis not present

## 2016-12-26 DIAGNOSIS — F0390 Unspecified dementia without behavioral disturbance: Secondary | ICD-10-CM | POA: Diagnosis not present

## 2016-12-26 DIAGNOSIS — R293 Abnormal posture: Secondary | ICD-10-CM | POA: Diagnosis not present

## 2016-12-26 DIAGNOSIS — R279 Unspecified lack of coordination: Secondary | ICD-10-CM | POA: Diagnosis not present

## 2016-12-26 DIAGNOSIS — R1312 Dysphagia, oropharyngeal phase: Secondary | ICD-10-CM | POA: Diagnosis not present

## 2016-12-26 DIAGNOSIS — M6281 Muscle weakness (generalized): Secondary | ICD-10-CM | POA: Diagnosis not present

## 2016-12-26 LAB — VITAMIN D 25 HYDROXY (VIT D DEFICIENCY, FRACTURES): Vit D, 25-Hydroxy: 30.4 ng/mL (ref 30.0–100.0)

## 2016-12-28 DIAGNOSIS — R293 Abnormal posture: Secondary | ICD-10-CM | POA: Diagnosis not present

## 2016-12-28 DIAGNOSIS — F0390 Unspecified dementia without behavioral disturbance: Secondary | ICD-10-CM | POA: Diagnosis not present

## 2016-12-28 DIAGNOSIS — R279 Unspecified lack of coordination: Secondary | ICD-10-CM | POA: Diagnosis not present

## 2016-12-28 DIAGNOSIS — M6281 Muscle weakness (generalized): Secondary | ICD-10-CM | POA: Diagnosis not present

## 2016-12-28 DIAGNOSIS — R1312 Dysphagia, oropharyngeal phase: Secondary | ICD-10-CM | POA: Diagnosis not present

## 2016-12-29 DIAGNOSIS — M6281 Muscle weakness (generalized): Secondary | ICD-10-CM | POA: Diagnosis not present

## 2016-12-29 DIAGNOSIS — R279 Unspecified lack of coordination: Secondary | ICD-10-CM | POA: Diagnosis not present

## 2016-12-29 DIAGNOSIS — R293 Abnormal posture: Secondary | ICD-10-CM | POA: Diagnosis not present

## 2016-12-29 DIAGNOSIS — R1312 Dysphagia, oropharyngeal phase: Secondary | ICD-10-CM | POA: Diagnosis not present

## 2016-12-29 DIAGNOSIS — F0390 Unspecified dementia without behavioral disturbance: Secondary | ICD-10-CM | POA: Diagnosis not present

## 2016-12-30 DIAGNOSIS — R279 Unspecified lack of coordination: Secondary | ICD-10-CM | POA: Diagnosis not present

## 2016-12-30 DIAGNOSIS — R1312 Dysphagia, oropharyngeal phase: Secondary | ICD-10-CM | POA: Diagnosis not present

## 2016-12-30 DIAGNOSIS — M6281 Muscle weakness (generalized): Secondary | ICD-10-CM | POA: Diagnosis not present

## 2016-12-30 DIAGNOSIS — R293 Abnormal posture: Secondary | ICD-10-CM | POA: Diagnosis not present

## 2016-12-30 DIAGNOSIS — F0390 Unspecified dementia without behavioral disturbance: Secondary | ICD-10-CM | POA: Diagnosis not present

## 2016-12-31 DIAGNOSIS — R293 Abnormal posture: Secondary | ICD-10-CM | POA: Diagnosis not present

## 2016-12-31 DIAGNOSIS — R1312 Dysphagia, oropharyngeal phase: Secondary | ICD-10-CM | POA: Diagnosis not present

## 2016-12-31 DIAGNOSIS — M6281 Muscle weakness (generalized): Secondary | ICD-10-CM | POA: Diagnosis not present

## 2016-12-31 DIAGNOSIS — F0390 Unspecified dementia without behavioral disturbance: Secondary | ICD-10-CM | POA: Diagnosis not present

## 2016-12-31 DIAGNOSIS — R279 Unspecified lack of coordination: Secondary | ICD-10-CM | POA: Diagnosis not present

## 2017-01-01 DIAGNOSIS — R293 Abnormal posture: Secondary | ICD-10-CM | POA: Diagnosis not present

## 2017-01-01 DIAGNOSIS — M6281 Muscle weakness (generalized): Secondary | ICD-10-CM | POA: Diagnosis not present

## 2017-01-01 DIAGNOSIS — R279 Unspecified lack of coordination: Secondary | ICD-10-CM | POA: Diagnosis not present

## 2017-01-01 DIAGNOSIS — R1312 Dysphagia, oropharyngeal phase: Secondary | ICD-10-CM | POA: Diagnosis not present

## 2017-01-01 DIAGNOSIS — F0390 Unspecified dementia without behavioral disturbance: Secondary | ICD-10-CM | POA: Diagnosis not present

## 2017-01-06 ENCOUNTER — Encounter: Payer: Self-pay | Admitting: Internal Medicine

## 2017-01-06 ENCOUNTER — Non-Acute Institutional Stay (SKILLED_NURSING_FACILITY): Payer: Medicare Other | Admitting: Internal Medicine

## 2017-01-06 DIAGNOSIS — E559 Vitamin D deficiency, unspecified: Secondary | ICD-10-CM

## 2017-01-06 DIAGNOSIS — S72141S Displaced intertrochanteric fracture of right femur, sequela: Secondary | ICD-10-CM | POA: Diagnosis not present

## 2017-01-06 DIAGNOSIS — K5900 Constipation, unspecified: Secondary | ICD-10-CM | POA: Diagnosis not present

## 2017-01-06 DIAGNOSIS — F0390 Unspecified dementia without behavioral disturbance: Secondary | ICD-10-CM

## 2017-01-06 DIAGNOSIS — F329 Major depressive disorder, single episode, unspecified: Secondary | ICD-10-CM

## 2017-01-06 DIAGNOSIS — F32A Depression, unspecified: Secondary | ICD-10-CM

## 2017-01-06 NOTE — Progress Notes (Signed)
Location:    Nursing Home Room Number: 154/D Place of Service:  SNF (31) Provider:  Estill Batten   Patient Care Team: Mahlon Gammon, MD as PCP - General (Internal Medicine) Roena Malady, PA-C as Physician Assistant (Internal Medicine)  Extended Emergency Contact Information Primary Emergency Contact: [facility] Address: [address] [place], [place] [address] [place] Home Phone: [phone] Mobile Phone: [phone] Relation: Daughter Secondary Emergency Contact: Gena Fray [place] Mobile Phone: [phone] Relation: Son  Code Status:  Full Code Goals of care: Advanced Directive information Advanced Directives 01/06/2017  Does Patient Have a Medical Advance Directive? Yes  Type of Advance Directive (No Data)  Does patient want to make changes to medical advance directive? No - Patient declined  Copy of Healthcare Power of Attorney in Chart? -  Would patient like information on creating a medical advance directive? No - Patient declined     Chief Complaint  Patient presents with  . Medical Management of Chronic Issues    Routine Visit, Bone Density due,    For medical management of chronic medical issues including dementia-vitamin D deficiency-depression-history of right hip fracture-osteoporosis.   HPI:  Pt is a 77 y.o. female seen today for medical management of chronic diseases.  As noted above.  He does have a history of a right hip fracture that was surgically repaired she continues on calcium with vitamin D level earlier this month was 30.4-it appears she is due for a bone density test which we will order.  There been no recent falls to my knowledge she's doing well and appears with supportive care.  Await is relatively stable at 120 pounds-she does have a history of dementia again on supportive care she is on Aricept low-dose 5 mg a day.  She also has a history of depression she is on Zoloft and  this appears to be stable as well continues to be good spirits nursing staff has not really noted any recent behaviors.  Today she is sitting in her wheelchair comfortably she is somewhat confused but pleasant and cooperative smiling which is her baseline   Past Medical History:  Diagnosis Date  . Acute encephalopathy 2012   In context of diarrhea and clinical dehydration  . Dementia    Noncompliant with Aricept as per daughter  . Protein-calorie malnutrition, severe (HCC)   . Underweight   . Vertigo    Past Surgical History:  Procedure Laterality Date  . COMPRESSION HIP SCREW Right 09/21/2015   Procedure: COMPRESSION HIP;  Surgeon: Vickki Hearing, MD;  Location: [facility];  Service: Orthopedics;  Laterality: Right;    Allergies  Allergen Reactions  . Beef-Derived Products     Outpatient Encounter Prescriptions as of 01/06/2017  Medication Sig  . acetaminophen (TYLENOL) 325 MG tablet Take 2 tablets (650 mg total) by mouth every 6 (six) hours as needed for mild pain (or Fever >/= 101).  Marland Kitchen aspirin EC 325 MG EC tablet Take 1 tablet (325 mg total) by mouth daily with breakfast.  . calcium carbonate (OS-CAL - DOSED IN MG OF ELEMENTAL CALCIUM) 1250 (500 Ca) MG tablet Take 1 tablet by mouth 2 (two) times daily with a meal.  . Cholecalciferol 50000 units TABS Take 50,000 Units by mouth daily. To be taken on Monday  . donepezil (ARICEPT) 5 MG tablet Take 5 mg by mouth at bedtime.  . senna-docusate (SENOKOT-S) 8.6-50 MG tablet Take 1 tablet  by mouth 2 (two) times daily.  . sertraline (ZOLOFT) 25 MG tablet Take 25 mg by mouth daily.  Marland Kitchen. triamcinolone (KENALOG) 0.025 % cream Apply 1 application topically 2 (two) times daily.  . [DISCONTINUED] ondansetron (ZOFRAN) 4 MG tablet Take 4 mg by mouth every 6 (six) hours as needed for nausea or vomiting.   No facility-administered encounter medications on file as of 01/06/2017.      Review of Systems   Essentially unattainable secondary to  dementia please see history of present illness  Immunization History  Administered Date(s) Administered  . Influenza-Unspecified 03/20/2016  . PPD Test 06/12/2015  . Pneumococcal-Unspecified 04/02/2016   Pertinent  Health Maintenance Due  Topic Date Due  . DEXA SCAN  06/22/2017 (Originally 04/09/2004)  . INFLUENZA VACCINE  01/20/2017  . PNA vac Low Risk Adult (2 of 2 - PCV13) 04/02/2017   No flowsheet data found. Functional Status Survey:    Vitals:   01/06/17 1410  BP: 122/78  Pulse: 66  Resp: 16  Temp: 98 F (36.7 C)  TempSrc: Oral  Weight: 120 lb 9.6 oz (54.7 kg)  Height: 5\' 2"  (1.575 m)   Body mass index is 22.06 kg/m. Physical Exam In general this is a somewhat frail elderly female in no distres Sitting comfortably in her wheelchair  Her skin is warm and dry.  Eyes sclera and icteric clear visual acuity appears grossly intact she does have  prescription lenses.  Oropharynx is clear mucous membranes moist.  Chest is clear to auscultation there is no labored breathing.  Heart is regular rate and rhythm without murmur gallop or rub she does not really have significant lower extremity edema  Muscle skeletal does move all extremities 4 does have general frailty-she does have arthritic changes but I do not note any overt deformities.  Neurologic is grossly intact cranial nerves appear grossly intact she has somewhat dysarthric speech which is baseline.  Psych she is oriented to self is pleasant and follows simple verbal commands without difficulty continues to be pleasant smiling  Labs reviewed:  Recent Labs  05/04/16 0700 07/23/16 0700 09/22/16 0700  NA 139 137 138  K 3.6 4.1 3.9  CL 103 101 100*  CO2 31 30 32  GLUCOSE 91 97 129*  BUN 17 18 19   CREATININE 0.63 0.61 0.68  CALCIUM 9.4 9.0 9.5    Recent Labs  01/31/16 1406 07/23/16 0700 09/22/16 0700  AST 33 32 24  ALT 31 27 19   ALKPHOS 67 54 64  BILITOT 0.6 0.5 0.7  PROT 6.7 6.1* 7.2   ALBUMIN 3.9 3.5 3.9    Recent Labs  01/31/16 1406 05/04/16 0700 07/23/16 0700 09/22/16 0700  WBC 6.1 5.5 4.7 4.1  NEUTROABS 4.9  --   --  2.2  HGB 13.1 14.1 13.5 14.3  HCT 39.1 41.9 39.3 43.1  MCV 95.4 97.0 95.4 97.7  PLT 209 225 193 205   Lab Results  Component Value Date   TSH 1.066 01/31/2016   Lab Results  Component Value Date   HGBA1C 5.4 01/10/2016   No results found for: CHOL, HDL, LDLCALC, LDLDIRECT, TRIG, CHOLHDL  Significant Diagnostic Results in last 30 days:  No results found.  Assessment/Plan  #1 dementia-this appears to be stable with supportive care weight is stable she is on Aricept 5 mg a day at this point will monitor.  #2 history of right hip fracture status post repair continues on calcium and vitamin D-she does not appear to have significant  pain at this time-for suspected osteoporosis will order a DEXA scan for follow-up-vitamin D level has normalized on lab done earlier this month.  #3 history of depression this appears stable on Zoloft 25 mg a day.  #4 constipation this appears stable on Senokot twice a day.  Of note will update lab work including a CBC and metabolic panel for updated values.  Last hemoglobin was stable at 14.3-electrolytes and renal function also appeared to be stable.  [id number]

## 2017-02-16 ENCOUNTER — Encounter: Payer: Self-pay | Admitting: Internal Medicine

## 2017-02-16 NOTE — Progress Notes (Signed)
Location:    [facility] Room Number: 154/D Place of Service:  SNF [phone]) Provider:  Lenon Curt, Freddie Breech, MD  Patient Care Team: Mahlon Gammon, MD as PCP - General (Internal Medicine) Roena Malady, PA-C as Physician Assistant (Internal Medicine)  Extended Emergency Contact Information Primary Emergency Contact: [facility] Address: [address] [place], [place] [address] [place] of [place] Home Phone: [phone] Mobile Phone: [phone] Relation: Daughter Secondary Emergency Contact: Gena Fray [place] of [place] Mobile Phone: [phone] Relation: Son  Code Status:  Full Code Goals of care: Advanced Directive information Advanced Directives 02/16/2017  Does Patient Have a Medical Advance Directive? Yes  Type of Advance Directive (No Data)  Does patient want to make changes to medical advance directive? No - Patient declined  Copy of Healthcare Power of Attorney in Chart? -  Would patient like information on creating a medical advance directive? No - Patient declined     Chief Complaint  Patient presents with  . Medical Management of Chronic Issues    Routine Visit    HPI:  Pt is a 76 y.o. female seen today for medical management of chronic diseases.     Past Medical History:  Diagnosis Date  . Acute encephalopathy 2012   In context of diarrhea and clinical dehydration  . Dementia    Noncompliant with Aricept as per daughter  . Protein-calorie malnutrition, severe (HCC)   . Underweight   . Vertigo    Past Surgical History:  Procedure Laterality Date  . COMPRESSION HIP SCREW Right 09/21/2015   Procedure: COMPRESSION HIP;  Surgeon: Vickki Hearing, MD;  Location: [facility];  Service: Orthopedics;  Laterality: Right;    Allergies  Allergen Reactions  . Beef-Derived Products     Outpatient Encounter Prescriptions as of 02/16/2017  Medication Sig  . acetaminophen (TYLENOL) 325 MG tablet  Take 2 tablets (650 mg total) by mouth every 6 (six) hours as needed for mild pain (or Fever >/= 101).  Marland Kitchen aspirin EC 325 MG EC tablet Take 1 tablet (325 mg total) by mouth daily with breakfast.  . calcium carbonate (OS-CAL - DOSED IN MG OF ELEMENTAL CALCIUM) 1250 (500 Ca) MG tablet Take 1 tablet by mouth 2 (two) times daily with a meal.  . Cholecalciferol 50000 units TABS Take 50,000 Units by mouth daily. To be taken on Monday  . donepezil (ARICEPT) 5 MG tablet Take 5 mg by mouth at bedtime.  . senna-docusate (SENOKOT-S) 8.6-50 MG tablet Take 1 tablet by mouth 2 (two) times daily.  . sertraline (ZOLOFT) 25 MG tablet Take 25 mg by mouth daily.  Marland Kitchen triamcinolone (KENALOG) 0.025 % cream Apply 1 application topically 2 (two) times daily.   No facility-administered encounter medications on file as of 02/16/2017.      Review of Systems  Immunization History  Administered Date(s) Administered  . Influenza-Unspecified 03/20/2016  . PPD Test 06/12/2015  . Pneumococcal-Unspecified 04/02/2016   Pertinent  Health Maintenance Due  Topic Date Due  . INFLUENZA VACCINE  05/22/2017 (Originally 01/20/2017)  . DEXA SCAN  06/22/2017 (Originally 04/09/2004)  . PNA vac Low Risk Adult (2 of 2 - PCV13) 04/02/2017   No flowsheet data found. Functional Status Survey:    Vitals:   02/16/17 1221  BP: 130/72  Pulse: 69  Resp: 18  Temp: 98.4 F (36.9 C)  TempSrc: Oral  Weight: 123 lb 3.2 oz (55.9 kg)  Height: 5\' 2"  (1.575 m)   Body mass index is 22.53 kg/m. Physical Exam  Labs reviewed:  Recent Labs  05/04/16 0700 07/23/16 0700 09/22/16 0700  NA 139 137 138  K 3.6 4.1 3.9  CL 103 101 100*  CO2 31 30 32  GLUCOSE 91 97 129*  BUN 17 18 19   CREATININE 0.63 0.61 0.68  CALCIUM 9.4 9.0 9.5    Recent Labs  07/23/16 0700 09/22/16 0700  AST 32 24  ALT 27 19  ALKPHOS 54 64  BILITOT 0.5 0.7  PROT 6.1* 7.2  ALBUMIN 3.5 3.9    Recent Labs  05/04/16 0700 07/23/16 0700 09/22/16 0700    WBC 5.5 4.7 4.1  NEUTROABS  --   --  2.2  HGB 14.1 13.5 14.3  HCT 41.9 39.3 43.1  MCV 97.0 95.4 97.7  PLT 225 193 205   Lab Results  Component Value Date   TSH 1.066 01/31/2016   Lab Results  Component Value Date   HGBA1C 5.4 01/10/2016   No results found for: CHOL, HDL, LDLCALC, LDLDIRECT, TRIG, CHOLHDL  Significant Diagnostic Results in last 30 days:  No results found.  Assessment/Plan There are no diagnoses linked to this encounter.   Family/ staff Communication:   Labs/tests ordered:       This encounter was created in error - please disregard.

## 2017-02-18 ENCOUNTER — Non-Acute Institutional Stay (SKILLED_NURSING_FACILITY): Payer: Medicare Other | Admitting: Internal Medicine

## 2017-02-18 ENCOUNTER — Encounter: Payer: Self-pay | Admitting: Internal Medicine

## 2017-02-18 DIAGNOSIS — F0390 Unspecified dementia without behavioral disturbance: Secondary | ICD-10-CM

## 2017-02-18 DIAGNOSIS — M810 Age-related osteoporosis without current pathological fracture: Secondary | ICD-10-CM

## 2017-02-18 DIAGNOSIS — E559 Vitamin D deficiency, unspecified: Secondary | ICD-10-CM | POA: Diagnosis not present

## 2017-02-18 DIAGNOSIS — F339 Major depressive disorder, recurrent, unspecified: Secondary | ICD-10-CM

## 2017-02-18 NOTE — Progress Notes (Signed)
Location:    [facility] Room Number: 154/D Place of Service:  SNF [phone]) Provider:  Lenon CurtAnjali,Gupta  Gupta, Freddie BreechAnjali L, MD  Patient Care Team: Mahlon GammonGupta, Anjali L, MD as PCP - General (Internal Medicine) Roena MaladyLassen, Arlo C, PA-C as Physician Assistant (Internal Medicine)  Extended Emergency Contact Information Primary Emergency Contact: [facility] Address: [address]          [place], [place] [address] [place] Home Phone: [phone] Mobile Phone: [phone] Relation: Daughter Secondary Emergency Contact: Gena Frayurner,Clark  [place] Mobile Phone: [phone] Relation: Son  Code Status:  Full Code Goals of care: Advanced Directive information Advanced Directives 02/18/2017  Does Patient Have a Medical Advance Directive? Yes  Type of Advance Directive (No Data)  Does patient want to make changes to medical advance directive? No - Patient declined  Copy of Healthcare Power of Attorney in Chart? -  Would patient like information on creating a medical advance directive? No - Patient declined     Chief Complaint  Patient presents with  . Medical Management of Chronic Issues    Routine Visit    HPI:  Pt is a 76 y.o. female seen today for medical management of chronic diseases.    Patent has h/o Right Femoral Fracture due to fall., Depression , Dementia She is Long term residient of facility. Has been stable with no New nursing issues. Her weight is 123 lbs and stable She usually answers in yes or no.  Past Medical History:  Diagnosis Date  . Acute encephalopathy 2012   In context of diarrhea and clinical dehydration  . Dementia    Noncompliant with Aricept as per daughter  . Protein-calorie malnutrition, severe (HCC)   . Underweight   . Vertigo    Past Surgical History:  Procedure Laterality Date  . COMPRESSION HIP SCREW Right 09/21/2015   Procedure: COMPRESSION HIP;  Surgeon: Vickki HearingStanley E Harrison, MD;  Location: [facility];  Service: Orthopedics;  Laterality: Right;    Allergies  Allergen Reactions  . Beef-Derived Products     Outpatient Encounter Prescriptions as of 02/18/2017  Medication Sig  . acetaminophen (TYLENOL) 325 MG tablet Take 2 tablets (650 mg total) by mouth every 6 (six) hours as needed for mild pain (or Fever >/= 101).  Marland Kitchen. aspirin EC 325 MG EC tablet Take 1 tablet (325 mg total) by mouth daily with breakfast.  . calcium carbonate (OS-CAL - DOSED IN MG OF ELEMENTAL CALCIUM) 1250 (500 Ca) MG tablet Take 1 tablet by mouth 2 (two) times daily with a meal.  . Cholecalciferol 50000 units TABS Take 50,000 Units by mouth daily. To be taken on Monday  . donepezil (ARICEPT) 5 MG tablet Take 5 mg by mouth at bedtime.  . senna-docusate (SENOKOT-S) 8.6-50 MG tablet Take 1 tablet by mouth 2 (two) times daily.  . sertraline (ZOLOFT) 25 MG tablet Take 25 mg by mouth daily.  Marland Kitchen. triamcinolone (KENALOG) 0.025 % cream Apply 1 application topically 2 (two) times daily.   No facility-administered encounter medications on file as of 02/18/2017.      Review of Systems  Unable to perform ROS: Dementia    Immunization History  Administered Date(s) Administered  . Influenza-Unspecified 03/20/2016  . PPD Test 06/12/2015  . Pneumococcal-Unspecified 04/02/2016   Pertinent  Health Maintenance Due  Topic Date Due  . INFLUENZA VACCINE  05/22/2017 (Originally 01/20/2017)  . PNA vac Low Risk Adult (2 of 2 - PCV13) 04/02/2017  . DEXA  SCAN  Completed   No flowsheet data found. Functional Status Survey:    Vitals:   02/18/17 1321  BP: 130/72  Pulse: 69  Resp: 18  Temp: 98.4 F (36.9 C)  TempSrc: Oral  Weight: 123 lb 3.2 oz (55.9 kg)  Height: 5\' 2"  (1.575 m)   Body mass index is 22.53 kg/m. Physical Exam  Constitutional: She appears well-developed.  HENT:  Head: Normocephalic.  Mouth/Throat: Oropharynx is clear and moist.  Eyes: Pupils are equal, round, and reactive to light.  Neck: Neck supple.    Cardiovascular: Normal rate and normal heart sounds.   No murmur heard. Pulmonary/Chest: Effort normal and breath sounds normal. No respiratory distress. She has no wheezes. She has no rales.  Abdominal: Soft. Bowel sounds are normal. She exhibits no distension. There is no tenderness. There is no rebound.  Musculoskeletal: She exhibits no edema.  Neurological: She is alert.  Skin: Skin is warm and dry.  Psychiatric: She has a normal mood and affect. Her behavior is normal.    Labs reviewed:  Recent Labs  05/04/16 0700 07/23/16 0700 09/22/16 0700  NA 139 137 138  K 3.6 4.1 3.9  CL 103 101 100*  CO2 31 30 32  GLUCOSE 91 97 129*  BUN 17 18 19   CREATININE 0.63 0.61 0.68  CALCIUM 9.4 9.0 9.5    Recent Labs  07/23/16 0700 09/22/16 0700  AST 32 24  ALT 27 19  ALKPHOS 54 64  BILITOT 0.5 0.7  PROT 6.1* 7.2  ALBUMIN 3.5 3.9    Recent Labs  05/04/16 0700 07/23/16 0700 09/22/16 0700  WBC 5.5 4.7 4.1  NEUTROABS  --   --  2.2  HGB 14.1 13.5 14.3  HCT 41.9 39.3 43.1  MCV 97.0 95.4 97.7  PLT 225 193 205   Lab Results  Component Value Date   TSH 1.066 01/31/2016   Lab Results  Component Value Date   HGBA1C 5.4 01/10/2016   No results found for: CHOL, HDL, LDLCALC, LDLDIRECT, TRIG, CHOLHDL  Significant Diagnostic Results in last 30 days:  No results found.  Assessment/Plan  Osteoporosis Patient DEXA imaging shows T Score of -2.88 Will start her on Fosamax. She has normal calcium, TSH and Vit D level. Will repeat these test  Vitamin D deficiency On Supplement  Depression,  On Zoloft Dementia  On Aricept.  Total time spent in this patient care encounter was _25 minutes; greater than 50% of the visit spent  reviewing records , Labs and coordinating care for problems addressed at this encounter.      Family/ staff Communication:   Labs/tests ordered:  TSH, CBC, CMP

## 2017-02-19 ENCOUNTER — Encounter (HOSPITAL_COMMUNITY)
Admission: RE | Admit: 2017-02-19 | Discharge: 2017-02-19 | Disposition: A | Discharge status: Home / Self Care | Payer: Medicare Other | Source: Skilled Nursing Facility | Attending: Internal Medicine | Admitting: Internal Medicine

## 2017-02-19 DIAGNOSIS — F064 Anxiety disorder due to known physiological condition: Secondary | ICD-10-CM | POA: Insufficient documentation

## 2017-02-19 DIAGNOSIS — Z4789 Encounter for other orthopedic aftercare: Secondary | ICD-10-CM | POA: Insufficient documentation

## 2017-02-19 DIAGNOSIS — R1312 Dysphagia, oropharyngeal phase: Secondary | ICD-10-CM | POA: Insufficient documentation

## 2017-02-19 DIAGNOSIS — E559 Vitamin D deficiency, unspecified: Secondary | ICD-10-CM | POA: Insufficient documentation

## 2017-02-19 LAB — CBC WITH DIFFERENTIAL/PLATELET
Basophils Absolute: 0 10*3/uL (ref 0.0–0.1)
Basophils Relative: 0 %
Eosinophils Absolute: 0.3 10*3/uL (ref 0.0–0.7)
Eosinophils Relative: 6 %
HEMATOCRIT: 36.9 % (ref 36.0–46.0)
HEMOGLOBIN: 12.5 g/dL (ref 12.0–15.0)
LYMPHS ABS: 2.6 10*3/uL (ref 0.7–4.0)
Lymphocytes Relative: 43 %
MCH: 32.1 pg (ref 26.0–34.0)
MCHC: 33.9 g/dL (ref 30.0–36.0)
MCV: 94.9 fL (ref 78.0–100.0)
MONOS PCT: 7 %
Monocytes Absolute: 0.4 10*3/uL (ref 0.1–1.0)
NEUTROS PCT: 44 %
Neutro Abs: 2.7 10*3/uL (ref 1.7–7.7)
Platelets: 180 10*3/uL (ref 150–400)
RBC: 3.89 MIL/uL (ref 3.87–5.11)
RDW: 12.3 % (ref 11.5–15.5)
WBC: 6 10*3/uL (ref 4.0–10.5)

## 2017-02-19 LAB — COMPREHENSIVE METABOLIC PANEL
ALK PHOS: 57 U/L (ref 38–126)
ALT: 25 U/L (ref 14–54)
AST: 26 U/L (ref 15–41)
Albumin: 3.4 g/dL — ABNORMAL LOW (ref 3.5–5.0)
Anion gap: 5 (ref 5–15)
BUN: 18 mg/dL (ref 6–20)
CALCIUM: 8.7 mg/dL — AB (ref 8.9–10.3)
CO2: 30 mmol/L (ref 22–32)
CREATININE: 0.62 mg/dL (ref 0.44–1.00)
Chloride: 103 mmol/L (ref 101–111)
Glucose, Bld: 92 mg/dL (ref 65–99)
Potassium: 4 mmol/L (ref 3.5–5.1)
Sodium: 138 mmol/L (ref 135–145)
Total Bilirubin: 0.6 mg/dL (ref 0.3–1.2)
Total Protein: 6 g/dL — ABNORMAL LOW (ref 6.5–8.1)

## 2017-02-19 LAB — TSH: TSH: 2.391 u[IU]/mL (ref 0.350–4.500)

## 2017-02-20 LAB — VITAMIN D 25 HYDROXY (VIT D DEFICIENCY, FRACTURES): VIT D 25 HYDROXY: 42.3 ng/mL (ref 30.0–100.0)

## 2017-03-01 ENCOUNTER — Non-Acute Institutional Stay (SKILLED_NURSING_FACILITY): Payer: Medicare Other

## 2017-03-01 DIAGNOSIS — Z0000 Encounter for general adult medical examination without abnormal findings: Secondary | ICD-10-CM | POA: Diagnosis not present

## 2017-03-01 NOTE — Patient Instructions (Signed)
[name] , Thank you for taking time to come for your Medicare Wellness Visit. I appreciate your ongoing commitment to your health goals. Please review the following plan we discussed and let me know if I can assist you in the future.   Screening recommendations/referrals: Colonoscopy excluded, pt over age 78 Mammogram excluded, pt over age 78 Bone Density up to date Recommended yearly ophthalmology/optometry visit for glaucoma screening and checkup Recommended yearly dental visit for hygiene and checkup  Vaccinations: Influenza vaccine due Pneumococcal vaccine up to date. Prevnar due 04/02/17 Tdap vaccine due Shingles vaccine not in records    Advanced directives: Need a copy for chart  Conditions/risks identified: None  Next appointment: Dr. Chales AbrahamsGupta makes rounds   Preventive Care 65 Years and Older, Female Preventive care refers to lifestyle choices and visits with your health care provider that can promote health and wellness. What does preventive care include?  A yearly physical exam. This is also called an annual well check.  Dental exams once or twice a year.  Routine eye exams. Ask your health care provider how often you should have your eyes checked.  Personal lifestyle choices, including:  Daily care of your teeth and gums.  Regular physical activity.  Eating a healthy diet.  Avoiding tobacco and drug use.  Limiting alcohol use.  Practicing safe sex.  Taking low-dose aspirin every day.  Taking vitamin and mineral supplements as recommended by your health care provider. What happens during an annual well check? The services and screenings done by your health care provider during your annual well check will depend on your age, overall health, lifestyle risk factors, and family history of disease. Counseling  Your health care provider may ask you questions about your:  Alcohol use.  Tobacco use.  Drug use.  Emotional well-being.  Home and  relationship well-being.  Sexual activity.  Eating habits.  History of falls.  Memory and ability to understand (cognition).  Work and work Astronomerenvironment.  Reproductive health. Screening  You may have the following tests or measurements:  Height, weight, and BMI.  Blood pressure.  Lipid and cholesterol levels. These may be checked every 5 years, or more frequently if you are over 78 years old.  Skin check.  Lung cancer screening. You may have this screening every year starting at age 78 if you have a 30-pack-year history of smoking and currently smoke or have quit within the past 15 years.  Fecal occult blood test (FOBT) of the stool. You may have this test every year starting at age 78.  Flexible sigmoidoscopy or colonoscopy. You may have a sigmoidoscopy every 5 years or a colonoscopy every 10 years starting at age 78.  Hepatitis C blood test.  Hepatitis B blood test.  Sexually transmitted disease (STD) testing.  Diabetes screening. This is done by checking your blood sugar (glucose) after you have not eaten for a while (fasting). You may have this done every 1-3 years.  Bone density scan. This is done to screen for osteoporosis. You may have this done starting at age 78.  Mammogram. This may be done every 1-2 years. Talk to your health care provider about how often you should have regular mammograms. Talk with your health care provider about your test results, treatment options, and if necessary, the need for more tests. Vaccines  Your health care provider may recommend certain vaccines, such as:  Influenza vaccine. This is recommended every year.  Tetanus, diphtheria, and acellular pertussis (Tdap, Td) vaccine. You may  need a Td booster every 10 years.  Zoster vaccine. You may need this after age 93.  Pneumococcal 13-valent conjugate (PCV13) vaccine. One dose is recommended after age 18.  Pneumococcal polysaccharide (PPSV23) vaccine. One dose is recommended after  age 59. Talk to your health care provider about which screenings and vaccines you need and how often you need them. This information is not intended to replace advice given to you by your health care provider. Make sure you discuss any questions you have with your health care provider. Document Released: 07/05/2015 Document Revised: 02/26/2016 Document Reviewed: 04/09/2015 Elsevier Interactive Patient Education  2017 [place] Prevention in the Home Falls can cause injuries. They can happen to people of all ages. There are many things you can do to make your home safe and to help prevent falls. What can I do on the outside of my home?  Regularly fix the edges of walkways and driveways and fix any cracks.  Remove anything that might make you trip as you walk through a door, such as a raised step or threshold.  Trim any bushes or trees on the path to your home.  Use bright outdoor lighting.  Clear any walking paths of anything that might make someone trip, such as rocks or tools.  Regularly check to see if handrails are loose or broken. Make sure that both sides of any steps have handrails.  Any raised decks and porches should have guardrails on the edges.  Have any leaves, snow, or ice cleared regularly.  Use sand or salt on walking paths during winter.  Clean up any spills in your garage right away. This includes oil or grease spills. What can I do in the bathroom?  Use night lights.  Install grab bars by the toilet and in the tub and shower. Do not use towel bars as grab bars.  Use non-skid mats or decals in the tub or shower.  If you need to sit down in the shower, use a plastic, non-slip stool.  Keep the floor dry. Clean up any water that spills on the floor as soon as it happens.  Remove soap buildup in the tub or shower regularly.  Attach bath mats securely with double-sided non-slip rug tape.  Do not have throw rugs and other things on the floor that can  make you trip. What can I do in the bedroom?  Use night lights.  Make sure that you have a light by your bed that is easy to reach.  Do not use any sheets or blankets that are too big for your bed. They should not hang down onto the floor.  Have a firm chair that has side arms. You can use this for support while you get dressed.  Do not have throw rugs and other things on the floor that can make you trip. What can I do in the kitchen?  Clean up any spills right away.  Avoid walking on wet floors.  Keep items that you use a lot in easy-to-reach places.  If you need to reach something above you, use a strong step stool that has a grab bar.  Keep electrical cords out of the way.  Do not use floor polish or wax that makes floors slippery. If you must use wax, use non-skid floor wax.  Do not have throw rugs and other things on the floor that can make you trip. What can I do with my stairs?  Do not leave any items on  the stairs.  Make sure that there are handrails on both sides of the stairs and use them. Fix handrails that are broken or loose. Make sure that handrails are as long as the stairways.  Check any carpeting to make sure that it is firmly attached to the stairs. Fix any carpet that is loose or worn.  Avoid having throw rugs at the top or bottom of the stairs. If you do have throw rugs, attach them to the floor with carpet tape.  Make sure that you have a light switch at the top of the stairs and the bottom of the stairs. If you do not have them, ask someone to add them for you. What else can I do to help prevent falls?  Wear shoes that:  Do not have high heels.  Have rubber bottoms.  Are comfortable and fit you well.  Are closed at the toe. Do not wear sandals.  If you use a stepladder:  Make sure that it is fully opened. Do not climb a closed stepladder.  Make sure that both sides of the stepladder are locked into place.  Ask someone to hold it for you,  if possible.  Clearly mark and make sure that you can see:  Any grab bars or handrails.  First and last steps.  Where the edge of each step is.  Use tools that help you move around (mobility aids) if they are needed. These include:  Canes.  Walkers.  Scooters.  Crutches.  Turn on the lights when you go into a dark area. Replace any light bulbs as soon as they burn out.  Set up your furniture so you have a clear path. Avoid moving your furniture around.  If any of your floors are uneven, fix them.  If there are any pets around you, be aware of where they are.  Review your medicines with your doctor. Some medicines can make you feel dizzy. This can increase your chance of falling. Ask your doctor what other things that you can do to help prevent falls. This information is not intended to replace advice given to you by your health care provider. Make sure you discuss any questions you have with your health care provider. Document Released: 04/04/2009 Document Revised: 11/14/2015 Document Reviewed: 07/13/2014 Elsevier Interactive Patient Education  2017 [employer].

## 2017-03-01 NOTE — Progress Notes (Signed)
Subjective:   Evelyn HutchingMildred Mackins V. is a 76 y.o. female who presents for an Initial Medicare Annual Wellness Visit at [facility]; incapacitated patient unable to answer questions appropriately       Objective:    Today's Vitals   03/01/17 1503  BP: 110/70  Pulse: 65  Temp: 98 F (36.7 C)  TempSrc: Oral  Weight: 123 lb (55.8 kg)  Height: 5\' 2"  (1.575 m)   Body mass index is 22.5 kg/m.   Current Medications (verified) Outpatient Encounter Prescriptions as of 03/01/2017  Medication Sig  . acetaminophen (TYLENOL) 325 MG tablet Take 2 tablets (650 mg total) by mouth every 6 (six) hours as needed for mild pain (or Fever >/= 101).  Marland Kitchen. alendronate (FOSAMAX) 70 MG tablet Take 70 mg by mouth once a week. Take with a full glass of water on an empty stomach.  Marland Kitchen. aspirin EC 325 MG EC tablet Take 1 tablet (325 mg total) by mouth daily with breakfast.  . calcium carbonate (OS-CAL - DOSED IN MG OF ELEMENTAL CALCIUM) 1250 (500 Ca) MG tablet Take 1 tablet by mouth 2 (two) times daily with a meal.  . Cholecalciferol 50000 units TABS Take 50,000 Units by mouth daily. To be taken on Monday  . donepezil (ARICEPT) 5 MG tablet Take 5 mg by mouth at bedtime.  . senna-docusate (SENOKOT-S) 8.6-50 MG tablet Take 1 tablet by mouth 2 (two) times daily.  . sertraline (ZOLOFT) 25 MG tablet Take 25 mg by mouth daily.  Marland Kitchen. triamcinolone (KENALOG) 0.025 % cream Apply 1 application topically 2 (two) times daily.   No facility-administered encounter medications on file as of 03/01/2017.     Allergies (verified) Beef-derived products   History: Past Medical History:  Diagnosis Date  . Acute encephalopathy 2012   In context of diarrhea and clinical dehydration  . Dementia    Noncompliant with Aricept as per daughter  . Protein-calorie malnutrition, severe (HCC)   . Underweight   . Vertigo    Past Surgical History:  Procedure Laterality Date  . COMPRESSION HIP SCREW Right 09/21/2015   Procedure: COMPRESSION HIP;  Surgeon: Vickki HearingStanley E Harrison, MD;  Location: [facility];  Service: Orthopedics;  Laterality: Right;   Family History  Problem Relation Age of Onset  . Dementia Mother   . Cancer Paternal Grandmother        of upper extremity  . Heart disease Neg Hx   . Diabetes Neg Hx   . Stroke Neg Hx    Social History   Occupational History  . Not on file.   Social History Main Topics  . Smoking status: Never Smoker  . Smokeless tobacco: Never Used  . Alcohol use Yes     Comment: occasionally  . Drug use: No  . Sexual activity: No    Tobacco Counseling Counseling given: Not Answered   Activities of Daily Living In your present state of health, do you have any difficulty performing the following activities: 03/01/2017  Hearing? N  Vision? N  Difficulty concentrating or making decisions? Y  Walking or climbing stairs? Y  Dressing or bathing? Y  Doing errands, shopping? Y  Preparing Food and eating ? Y  Using the Toilet? Y  In the past six months, have you accidently leaked urine? Y  Do you have problems with loss of bowel control? Y  Managing your Medications? Y  Managing your Finances? Y  Housekeeping or managing your Housekeeping? Y  Some recent data  might be hidden    Immunizations and Health Maintenance Immunization History  Administered Date(s) Administered  . Influenza-Unspecified 03/20/2016  . PPD Test 06/12/2015  . Pneumococcal-Unspecified 04/02/2016   There are no preventive care reminders to display for this patient.  Patient Care Team: Mahlon Gammon, MD as PCP - General (Internal Medicine) Roena Malady, PA-C as Physician Assistant (Internal Medicine)  Indicate any recent Medical Services you may have received from other than [facility] providers in the past year (date may be approximate).     Assessment:   This is a routine wellness examination for [place].   Hearing/Vision screen No exam data present  Dietary issues and exercise  activities discussed: Current Exercise Habits: The patient does not participate in regular exercise at present, Exercise limited by: neurologic condition(s)  Goals    None     Depression Screen PHQ 2/9 Scores 03/01/2017  PHQ - 2 Score 0    Fall Risk Fall Risk  03/01/2017  Falls in the past year? No    Cognitive Function:     6CIT Screen 03/01/2017  What Year? 4 points  What month? 0 points  What time? 3 points  Count back from 20 0 points  Months in reverse 4 points  Repeat phrase 10 points  Total Score 21    Screening Tests Health Maintenance  Topic Date Due  . INFLUENZA VACCINE  05/22/2017 (Originally 01/20/2017)  . TETANUS/TDAP  11/25/2025 (Originally 04/09/1960)  . PNA vac Low Risk Adult (2 of 2 - PCV13) 04/02/2017  . DEXA SCAN  Completed      Plan:    I have personally reviewed and addressed the Medicare Annual Wellness questionnaire and have noted the following in the patient's chart:  A. Medical and social history B. Use of alcohol, tobacco or illicit drugs  C. Current medications and supplements D. Functional ability and status E.  Nutritional status F.  Physical activity G. Advance directives H. List of other physicians I.  Hospitalizations, surgeries, and ER visits in previous 12 months J.  Vitals K. Screenings to include hearing, vision, cognitive, depression L. Referrals and appointments - none  In addition, I am unable to review and discuss with incapacitated patient certain preventive protocols, quality metrics, and best practice recommendations. A written personalized care plan for preventive services as well as general preventive health recommendations were provided to patient.   See attached scanned questionnaire for additional information.   Signed,   Annetta Maw, RN Nurse Health Advisor   Quick Notes   Health Maintenance: TDAP due.     Abnormal Screen: 6 CIT-21     Patient Concerns: None     Nurse Concerns: None

## 2017-03-08 DIAGNOSIS — Z9181 History of falling: Secondary | ICD-10-CM | POA: Diagnosis not present

## 2017-03-08 DIAGNOSIS — R41841 Cognitive communication deficit: Secondary | ICD-10-CM | POA: Diagnosis not present

## 2017-03-08 DIAGNOSIS — M6281 Muscle weakness (generalized): Secondary | ICD-10-CM | POA: Diagnosis not present

## 2017-03-08 DIAGNOSIS — F3289 Other specified depressive episodes: Secondary | ICD-10-CM | POA: Diagnosis not present

## 2017-03-08 DIAGNOSIS — Z4789 Encounter for other orthopedic aftercare: Secondary | ICD-10-CM | POA: Diagnosis not present

## 2017-03-08 DIAGNOSIS — R279 Unspecified lack of coordination: Secondary | ICD-10-CM | POA: Diagnosis not present

## 2017-03-08 DIAGNOSIS — F064 Anxiety disorder due to known physiological condition: Secondary | ICD-10-CM | POA: Diagnosis not present

## 2017-03-08 DIAGNOSIS — F0390 Unspecified dementia without behavioral disturbance: Secondary | ICD-10-CM | POA: Diagnosis not present

## 2017-03-09 DIAGNOSIS — F3289 Other specified depressive episodes: Secondary | ICD-10-CM | POA: Diagnosis not present

## 2017-03-09 DIAGNOSIS — M6281 Muscle weakness (generalized): Secondary | ICD-10-CM | POA: Diagnosis not present

## 2017-03-09 DIAGNOSIS — F0390 Unspecified dementia without behavioral disturbance: Secondary | ICD-10-CM | POA: Diagnosis not present

## 2017-03-09 DIAGNOSIS — Z9181 History of falling: Secondary | ICD-10-CM | POA: Diagnosis not present

## 2017-03-09 DIAGNOSIS — Z4789 Encounter for other orthopedic aftercare: Secondary | ICD-10-CM | POA: Diagnosis not present

## 2017-03-09 DIAGNOSIS — F064 Anxiety disorder due to known physiological condition: Secondary | ICD-10-CM | POA: Diagnosis not present

## 2017-03-10 DIAGNOSIS — F064 Anxiety disorder due to known physiological condition: Secondary | ICD-10-CM | POA: Diagnosis not present

## 2017-03-10 DIAGNOSIS — F3289 Other specified depressive episodes: Secondary | ICD-10-CM | POA: Diagnosis not present

## 2017-03-10 DIAGNOSIS — Z4789 Encounter for other orthopedic aftercare: Secondary | ICD-10-CM | POA: Diagnosis not present

## 2017-03-10 DIAGNOSIS — M6281 Muscle weakness (generalized): Secondary | ICD-10-CM | POA: Diagnosis not present

## 2017-03-10 DIAGNOSIS — F0390 Unspecified dementia without behavioral disturbance: Secondary | ICD-10-CM | POA: Diagnosis not present

## 2017-03-10 DIAGNOSIS — Z9181 History of falling: Secondary | ICD-10-CM | POA: Diagnosis not present

## 2017-03-11 DIAGNOSIS — F0390 Unspecified dementia without behavioral disturbance: Secondary | ICD-10-CM | POA: Diagnosis not present

## 2017-03-11 DIAGNOSIS — Z4789 Encounter for other orthopedic aftercare: Secondary | ICD-10-CM | POA: Diagnosis not present

## 2017-03-11 DIAGNOSIS — F3289 Other specified depressive episodes: Secondary | ICD-10-CM | POA: Diagnosis not present

## 2017-03-11 DIAGNOSIS — Z9181 History of falling: Secondary | ICD-10-CM | POA: Diagnosis not present

## 2017-03-11 DIAGNOSIS — M6281 Muscle weakness (generalized): Secondary | ICD-10-CM | POA: Diagnosis not present

## 2017-03-11 DIAGNOSIS — F064 Anxiety disorder due to known physiological condition: Secondary | ICD-10-CM | POA: Diagnosis not present

## 2017-03-12 DIAGNOSIS — F064 Anxiety disorder due to known physiological condition: Secondary | ICD-10-CM | POA: Diagnosis not present

## 2017-03-12 DIAGNOSIS — Z4789 Encounter for other orthopedic aftercare: Secondary | ICD-10-CM | POA: Diagnosis not present

## 2017-03-12 DIAGNOSIS — F0390 Unspecified dementia without behavioral disturbance: Secondary | ICD-10-CM | POA: Diagnosis not present

## 2017-03-12 DIAGNOSIS — Z9181 History of falling: Secondary | ICD-10-CM | POA: Diagnosis not present

## 2017-03-12 DIAGNOSIS — M6281 Muscle weakness (generalized): Secondary | ICD-10-CM | POA: Diagnosis not present

## 2017-03-12 DIAGNOSIS — F3289 Other specified depressive episodes: Secondary | ICD-10-CM | POA: Diagnosis not present

## 2017-03-14 DIAGNOSIS — Z9181 History of falling: Secondary | ICD-10-CM | POA: Diagnosis not present

## 2017-03-14 DIAGNOSIS — M6281 Muscle weakness (generalized): Secondary | ICD-10-CM | POA: Diagnosis not present

## 2017-03-14 DIAGNOSIS — F064 Anxiety disorder due to known physiological condition: Secondary | ICD-10-CM | POA: Diagnosis not present

## 2017-03-14 DIAGNOSIS — F3289 Other specified depressive episodes: Secondary | ICD-10-CM | POA: Diagnosis not present

## 2017-03-14 DIAGNOSIS — F0390 Unspecified dementia without behavioral disturbance: Secondary | ICD-10-CM | POA: Diagnosis not present

## 2017-03-14 DIAGNOSIS — Z4789 Encounter for other orthopedic aftercare: Secondary | ICD-10-CM | POA: Diagnosis not present

## 2017-03-15 DIAGNOSIS — Z9181 History of falling: Secondary | ICD-10-CM | POA: Diagnosis not present

## 2017-03-15 DIAGNOSIS — F3289 Other specified depressive episodes: Secondary | ICD-10-CM | POA: Diagnosis not present

## 2017-03-15 DIAGNOSIS — F0390 Unspecified dementia without behavioral disturbance: Secondary | ICD-10-CM | POA: Diagnosis not present

## 2017-03-15 DIAGNOSIS — M6281 Muscle weakness (generalized): Secondary | ICD-10-CM | POA: Diagnosis not present

## 2017-03-15 DIAGNOSIS — F064 Anxiety disorder due to known physiological condition: Secondary | ICD-10-CM | POA: Diagnosis not present

## 2017-03-15 DIAGNOSIS — Z4789 Encounter for other orthopedic aftercare: Secondary | ICD-10-CM | POA: Diagnosis not present

## 2017-03-16 ENCOUNTER — Encounter: Payer: Self-pay | Admitting: Internal Medicine

## 2017-03-16 DIAGNOSIS — Z9181 History of falling: Secondary | ICD-10-CM | POA: Diagnosis not present

## 2017-03-16 DIAGNOSIS — M6281 Muscle weakness (generalized): Secondary | ICD-10-CM | POA: Diagnosis not present

## 2017-03-16 DIAGNOSIS — F3289 Other specified depressive episodes: Secondary | ICD-10-CM | POA: Diagnosis not present

## 2017-03-16 DIAGNOSIS — F064 Anxiety disorder due to known physiological condition: Secondary | ICD-10-CM | POA: Diagnosis not present

## 2017-03-16 DIAGNOSIS — F0390 Unspecified dementia without behavioral disturbance: Secondary | ICD-10-CM | POA: Diagnosis not present

## 2017-03-16 DIAGNOSIS — Z4789 Encounter for other orthopedic aftercare: Secondary | ICD-10-CM | POA: Diagnosis not present

## 2017-03-16 NOTE — Progress Notes (Signed)
Location:   [facility] Room Number: 154/D Place of Service:  SNF [phone]) Provider:  Candida Peeling, Freddie Breech, MD  Patient Care Team: Mahlon Gammon, MD as PCP - General (Internal Medicine) Roena Malady, PA-C as Physician Assistant (Internal Medicine)  Extended Emergency Contact Information Primary Emergency Contact: [facility] Address: [address] [place], [place] [address] [place] Home Phone: [phone] Mobile Phone: [phone] Relation: Daughter Secondary Emergency Contact: Gena Fray [place] of [place] Mobile Phone: [phone] Relation: Son  Code Status:  Full Code Goals of care: Advanced Directive information Advanced Directives 03/16/2017  Does Patient Have a Medical Advance Directive? Yes  Type of Advance Directive (No Data)  Does patient want to make changes to medical advance directive? No - Patient declined  Copy of Healthcare Power of Attorney in Chart? -  Would patient like information on creating a medical advance directive? No - Patient declined     Chief Complaint  Patient presents with  . Medical Management of Chronic Issues    Routine Visit    HPI:  Pt is a 76 y.o. female seen today for medical management of chronic diseases.     Past Medical History:  Diagnosis Date  . Acute encephalopathy 2012   In context of diarrhea and clinical dehydration  . Dementia    Noncompliant with Aricept as per daughter  . Protein-calorie malnutrition, severe (HCC)   . Underweight   . Vertigo    Past Surgical History:  Procedure Laterality Date  . COMPRESSION HIP SCREW Right 09/21/2015   Procedure: COMPRESSION HIP;  Surgeon: Vickki Hearing, MD;  Location: [facility];  Service: Orthopedics;  Laterality: Right;    Allergies  Allergen Reactions  . Beef-Derived Products     Outpatient Encounter Prescriptions as of 03/16/2017  Medication Sig  . acetaminophen (TYLENOL) 325 MG tablet  Take 2 tablets (650 mg total) by mouth every 6 (six) hours as needed for mild pain (or Fever >/= 101).  Marland Kitchen alendronate (FOSAMAX) 70 MG tablet Take 70 mg by mouth once a week. Take with a full glass of water on an empty stomach.  Marland Kitchen aspirin EC 325 MG EC tablet Take 1 tablet (325 mg total) by mouth daily with breakfast.  . calcium carbonate (OS-CAL - DOSED IN MG OF ELEMENTAL CALCIUM) 1250 (500 Ca) MG tablet Take 1 tablet by mouth 2 (two) times daily with a meal.  . Cholecalciferol 50000 units TABS Take 50,000 Units by mouth daily. To be taken on Monday  . donepezil (ARICEPT) 5 MG tablet Take 5 mg by mouth at bedtime.  . senna-docusate (SENOKOT-S) 8.6-50 MG tablet Take 1 tablet by mouth 2 (two) times daily.  . sertraline (ZOLOFT) 25 MG tablet Take 25 mg by mouth daily.  Marland Kitchen triamcinolone (KENALOG) 0.025 % cream Apply 1 application topically 2 (two) times daily.   No facility-administered encounter medications on file as of 03/16/2017.      Review of Systems  Immunization History  Administered Date(s) Administered  . Influenza-Unspecified 03/20/2016  . PPD Test 06/12/2015  . Pneumococcal-Unspecified 04/02/2016   Pertinent  Health Maintenance Due  Topic Date Due  . INFLUENZA VACCINE  05/22/2017 (Originally 01/20/2017)  . PNA vac Low Risk Adult (2 of 2 - PCV13) 04/02/2017  . DEXA SCAN  Completed   Fall Risk  03/01/2017  Falls in the past year? No   Functional Status Survey:  Vitals:   03/16/17 1208  BP: 131/75  Pulse: 99  Resp: 20  Temp: 98.1 F (36.7 C)  TempSrc: Oral  Weight: 122 lb (55.3 kg)  Height:  (1.575 m)   Body mass index is 22.31 kg/m. Physical Exam  Labs reviewed:  Recent Labs  07/23/16 0700 09/22/16 0700 02/19/17 0250  NA 137 138 138  K 4.1 3.9 4.0  CL 101 100* 103  CO2 30 32 30  GLUCOSE 97 129* 92  BUN CREATININE 0.61 0.68 0.62  CALCIUM 9.0 9.5 8.7*    Recent Labs  07/23/16 0700 09/22/16 0700 02/19/17 0250  AST 32 24 26  ALT ALKPHOS 54 64 57  BILITOT 0.5 0.7 0.6  PROT 6.1* 7.2 6.0*  ALBUMIN 3.5 3.9 3.4*    Recent Labs  07/23/16 0700 09/22/16 0700 02/19/17 0250  WBC 4.7 4.1 6.0  NEUTROABS  --  2.2 2.7  HGB 13.5 14.3 12.5  HCT 39.3 43.1 36.9  MCV 95.4 97.7 94.9  PLT 193 205 180   Lab Results  Component Value Date   TSH 2.391 02/19/2017   Lab Results  Component Value Date   HGBA1C 5.4 01/10/2016   No results found for: CHOL, HDL, LDLCALC, LDLDIRECT, TRIG, CHOLHDL  Significant Diagnostic Results in last 30 days:  No results found.  Assessment/Plan There are no diagnoses linked to this encounter.   Family/ staff Communication:   Labs/tests ordered:       This encounter was created in error - please disregard.

## 2017-03-17 ENCOUNTER — Encounter: Payer: Self-pay | Admitting: Internal Medicine

## 2017-03-17 ENCOUNTER — Non-Acute Institutional Stay (SKILLED_NURSING_FACILITY): Payer: Medicare Other | Admitting: Internal Medicine

## 2017-03-17 DIAGNOSIS — F339 Major depressive disorder, recurrent, unspecified: Secondary | ICD-10-CM | POA: Diagnosis not present

## 2017-03-17 DIAGNOSIS — M2012 Hallux valgus (acquired), left foot: Secondary | ICD-10-CM | POA: Diagnosis not present

## 2017-03-17 DIAGNOSIS — Z9181 History of falling: Secondary | ICD-10-CM | POA: Diagnosis not present

## 2017-03-17 DIAGNOSIS — I739 Peripheral vascular disease, unspecified: Secondary | ICD-10-CM | POA: Diagnosis not present

## 2017-03-17 DIAGNOSIS — E559 Vitamin D deficiency, unspecified: Secondary | ICD-10-CM | POA: Diagnosis not present

## 2017-03-17 DIAGNOSIS — M810 Age-related osteoporosis without current pathological fracture: Secondary | ICD-10-CM | POA: Diagnosis not present

## 2017-03-17 DIAGNOSIS — F3289 Other specified depressive episodes: Secondary | ICD-10-CM | POA: Diagnosis not present

## 2017-03-17 DIAGNOSIS — M6281 Muscle weakness (generalized): Secondary | ICD-10-CM | POA: Diagnosis not present

## 2017-03-17 DIAGNOSIS — F0390 Unspecified dementia without behavioral disturbance: Secondary | ICD-10-CM

## 2017-03-17 DIAGNOSIS — S72141S Displaced intertrochanteric fracture of right femur, sequela: Secondary | ICD-10-CM

## 2017-03-17 DIAGNOSIS — Z4789 Encounter for other orthopedic aftercare: Secondary | ICD-10-CM | POA: Diagnosis not present

## 2017-03-17 DIAGNOSIS — B351 Tinea unguium: Secondary | ICD-10-CM | POA: Diagnosis not present

## 2017-03-17 DIAGNOSIS — F064 Anxiety disorder due to known physiological condition: Secondary | ICD-10-CM | POA: Diagnosis not present

## 2017-03-17 DIAGNOSIS — M2011 Hallux valgus (acquired), right foot: Secondary | ICD-10-CM | POA: Diagnosis not present

## 2017-03-17 NOTE — Progress Notes (Signed)
Location:   [facility] Room Number: 154/D Place of Service:  SNF [phone]) Provider:  Deniece Portela.Jannette Fogo, Freddie Breech, MD  Patient Care Team: Mahlon Gammon, MD as PCP - General (Internal Medicine) Roena Malady, PA-C as Physician Assistant (Internal Medicine)  Extended Emergency Contact Information Primary Emergency Contact: [facility] Address: [address] [place], [place] [address] [place] of [place] Home Phone: [phone] Mobile Phone: [phone] Relation: Daughter Secondary Emergency Contact: Gena Fray [place] Mobile Phone: [phone] Relation: Son  Code Status:  Full Code Goals of care: Advanced Directive information Advanced Directives 03/17/2017  Does Patient Have a Medical Advance Directive? Yes  Type of Advance Directive (No Data)  Does patient want to make changes to medical advance directive? No - Patient declined  Copy of Healthcare Power of Attorney in Chart? -  Would patient like information on creating a medical advance directive? No - Patient declined     Chief Complaint  Patient presents with  . Medical Management of Chronic Issues    Routine Visit   Medical management of chronic medical conditions including dementia-right femur fracture with osteoporosis-depression- HPI:  Pt is a 76 y.o. female seen today for medical management of chronic diseases. As noted above.  She is a long-term resident of this facility with a history of a right femur fracture that was surgically repaired-she also has dementia and continues on Aricept.  Currently she is relatively stable and appears to be doing well with supportive care her weight is stable at 122 pounds.  Regards to osteoporosis she is on Fosamax-. As well as vitamin D vitamin D level is  42.3.    For her dementia she continues on low-dose Aricept.  She does not speak much but continues to be pleasant smiling nursing does not report any  recent acute issues    Past Medical History:  Diagnosis Date  . Acute encephalopathy 2012   In context of diarrhea and clinical dehydration  . Dementia    Noncompliant with Aricept as per daughter  . Protein-calorie malnutrition, severe (HCC)   . Underweight   . Vertigo    Past Surgical History:  Procedure Laterality Date  . COMPRESSION HIP SCREW Right 09/21/2015   Procedure: COMPRESSION HIP;  Surgeon: Vickki Hearing, MD;  Location: [facility];  Service: Orthopedics;  Laterality: Right;    Allergies  Allergen Reactions  . Beef-Derived Products     Outpatient Encounter Prescriptions as of 03/17/2017  Medication Sig  . acetaminophen (TYLENOL) 325 MG tablet Take 2 tablets (650 mg total) by mouth every 6 (six) hours as needed for mild pain (or Fever >/= 101).  Marland Kitchen alendronate (FOSAMAX) 70 MG tablet Take 70 mg by mouth once a week. Take with a full glass of water on an empty stomach.  Marland Kitchen aspirin EC 325 MG EC tablet Take 1 tablet (325 mg total) by mouth daily with breakfast.  . calcium carbonate (OS-CAL - DOSED IN MG OF ELEMENTAL CALCIUM) 1250 (500 Ca) MG tablet Take 1 tablet by mouth 2 (two) times daily with a meal.  . Cholecalciferol 50000 units TABS Take 50,000 Units by mouth daily. To be taken on Monday  . donepezil (ARICEPT) 5 MG tablet Take 5 mg by mouth at bedtime.  . senna-docusate (SENOKOT-S) 8.6-50 MG tablet Take 1 tablet by mouth 2 (two) times daily.  . sertraline (ZOLOFT) 25 MG tablet Take 25 mg by mouth daily.  Marland Kitchen  triamcinolone (KENALOG) 0.025 % cream Apply 1 application topically 2 (two) times daily.   No facility-administered encounter medications on file as of 03/17/2017.      Review of Systems   Unattainable secondary to dementia  Immunization History  Administered Date(s) Administered  . Influenza-Unspecified 03/20/2016  . PPD Test 06/12/2015  . Pneumococcal-Unspecified 04/02/2016   Pertinent  Health Maintenance Due  Topic Date Due  . INFLUENZA VACCINE   05/22/2017 (Originally 01/20/2017)  . PNA vac Low Risk Adult (2 of 2 - PCV13) 04/02/2017  . DEXA SCAN  Completed   Fall Risk  03/01/2017  Falls in the past year? No   Functional Status Survey:    Vitals:   03/17/17 1137  BP: 131/75  Pulse: 99  Resp: 20  Temp: 98.1 F (36.7 C)  TempSrc: Oral  Weight: 122 lb (55.3 kg)  Height:  (1.575 m)   Body mass index is 22.31 kg/m. Physical Exam  In general this is a pleasant elderly female in no distress she basically answers yes or no does make eye contact and smiles.  Her skin is warm and dry.  Eyes she has prescription lenses visual acuity appears grossly intact.  Oropharynx is clear mucous membranes are moist.  Chest has some slight congestion but this clears quickly with coughing there is no labored breathing after coughing lungs are clear.  Heart is regular rate and rhythm without murmur gallop or rub she has trace lower extremity edema.  Her abdomen soft nontender with positive bowel sounds.  Muscle skeletal has general frailty but is able to move all extremities 4 I do not see any changes other than arthritic.  Neurologic is grossly intact she is bright and alert makes eye contact.  Psych she is oriented to self will shake her head yes or no and will talk minimally saying yes or no.     Labs reviewed:  Recent Labs  07/23/16 0700 09/22/16 0700 02/19/17 0250  NA 137 138 138  K 4.1 3.9 4.0  CL 101 100* 103  CO2 30 32 30  GLUCOSE 97 129* 92  BUN CREATININE 0.61 0.68 0.62  CALCIUM 9.0 9.5 8.7*    Recent Labs  07/23/16 0700 09/22/16 0700 02/19/17 0250  AST 32 24 26  ALT ALKPHOS 54 64 57  BILITOT 0.5 0.7 0.6  PROT 6.1* 7.2 6.0*  ALBUMIN 3.5 3.9 3.4*    Recent Labs  07/23/16 0700 09/22/16 0700 02/19/17 0250  WBC 4.7 4.1 6.0  NEUTROABS  --  2.2 2.7  HGB 13.5 14.3 12.5  HCT 39.3 43.1 36.9  MCV 95.4 97.7 94.9  PLT 193 205 180   Lab Results  Component Value Date   TSH  2.391 02/19/2017   Lab Results  Component Value Date   HGBA1C 5.4 01/10/2016   No results found for: CHOL, HDL, LDLCALC, LDLDIRECT, TRIG, CHOLHDL  Significant Diagnostic Results in last 30 days:  No results found.  Assessment/Plan  #1 dementia she appears to be doing well with supportive care weight is stable he continues on low-dose Aricept.  #2 history of right femur fracture she is on calcium and vitamin D vitamin D level was adequate at 42 on lab done last month-she continues on Fosamax again does well with supportive care.  #3 depression this appears stable on Zoloft.  #4 vitamin D deficiency again she is on supplementation level was 42.3 on 02/19/2017.  #5 osteoporosis she has been started  on Fosamax recent DEXA scan showed a T score of -2.8 a- TSH and vitamin D level again are within normal  Range-- calcium on recent lab was minimally low at 8.7     [id number]

## 2017-03-18 DIAGNOSIS — Z9181 History of falling: Secondary | ICD-10-CM | POA: Diagnosis not present

## 2017-03-18 DIAGNOSIS — F3289 Other specified depressive episodes: Secondary | ICD-10-CM | POA: Diagnosis not present

## 2017-03-18 DIAGNOSIS — Z4789 Encounter for other orthopedic aftercare: Secondary | ICD-10-CM | POA: Diagnosis not present

## 2017-03-18 DIAGNOSIS — F064 Anxiety disorder due to known physiological condition: Secondary | ICD-10-CM | POA: Diagnosis not present

## 2017-03-18 DIAGNOSIS — M6281 Muscle weakness (generalized): Secondary | ICD-10-CM | POA: Diagnosis not present

## 2017-03-18 DIAGNOSIS — F0390 Unspecified dementia without behavioral disturbance: Secondary | ICD-10-CM | POA: Diagnosis not present

## 2017-03-19 DIAGNOSIS — F064 Anxiety disorder due to known physiological condition: Secondary | ICD-10-CM | POA: Diagnosis not present

## 2017-03-19 DIAGNOSIS — F0390 Unspecified dementia without behavioral disturbance: Secondary | ICD-10-CM | POA: Diagnosis not present

## 2017-03-19 DIAGNOSIS — M6281 Muscle weakness (generalized): Secondary | ICD-10-CM | POA: Diagnosis not present

## 2017-03-19 DIAGNOSIS — F3289 Other specified depressive episodes: Secondary | ICD-10-CM | POA: Diagnosis not present

## 2017-03-19 DIAGNOSIS — Z9181 History of falling: Secondary | ICD-10-CM | POA: Diagnosis not present

## 2017-03-19 DIAGNOSIS — Z4789 Encounter for other orthopedic aftercare: Secondary | ICD-10-CM | POA: Diagnosis not present

## 2017-03-22 DIAGNOSIS — R41841 Cognitive communication deficit: Secondary | ICD-10-CM | POA: Diagnosis not present

## 2017-03-22 DIAGNOSIS — Z4789 Encounter for other orthopedic aftercare: Secondary | ICD-10-CM | POA: Diagnosis not present

## 2017-03-22 DIAGNOSIS — F0390 Unspecified dementia without behavioral disturbance: Secondary | ICD-10-CM | POA: Diagnosis not present

## 2017-03-22 DIAGNOSIS — F064 Anxiety disorder due to known physiological condition: Secondary | ICD-10-CM | POA: Diagnosis not present

## 2017-03-22 DIAGNOSIS — F3289 Other specified depressive episodes: Secondary | ICD-10-CM | POA: Diagnosis not present

## 2017-03-23 DIAGNOSIS — F0390 Unspecified dementia without behavioral disturbance: Secondary | ICD-10-CM | POA: Diagnosis not present

## 2017-03-23 DIAGNOSIS — F3289 Other specified depressive episodes: Secondary | ICD-10-CM | POA: Diagnosis not present

## 2017-03-23 DIAGNOSIS — F064 Anxiety disorder due to known physiological condition: Secondary | ICD-10-CM | POA: Diagnosis not present

## 2017-03-23 DIAGNOSIS — R41841 Cognitive communication deficit: Secondary | ICD-10-CM | POA: Diagnosis not present

## 2017-03-23 DIAGNOSIS — Z4789 Encounter for other orthopedic aftercare: Secondary | ICD-10-CM | POA: Diagnosis not present

## 2017-03-24 DIAGNOSIS — F0390 Unspecified dementia without behavioral disturbance: Secondary | ICD-10-CM | POA: Diagnosis not present

## 2017-03-24 DIAGNOSIS — R41841 Cognitive communication deficit: Secondary | ICD-10-CM | POA: Diagnosis not present

## 2017-03-24 DIAGNOSIS — F064 Anxiety disorder due to known physiological condition: Secondary | ICD-10-CM | POA: Diagnosis not present

## 2017-03-24 DIAGNOSIS — F3289 Other specified depressive episodes: Secondary | ICD-10-CM | POA: Diagnosis not present

## 2017-03-24 DIAGNOSIS — Z4789 Encounter for other orthopedic aftercare: Secondary | ICD-10-CM | POA: Diagnosis not present

## 2017-03-25 DIAGNOSIS — F3289 Other specified depressive episodes: Secondary | ICD-10-CM | POA: Diagnosis not present

## 2017-03-25 DIAGNOSIS — Z4789 Encounter for other orthopedic aftercare: Secondary | ICD-10-CM | POA: Diagnosis not present

## 2017-03-25 DIAGNOSIS — F0390 Unspecified dementia without behavioral disturbance: Secondary | ICD-10-CM | POA: Diagnosis not present

## 2017-03-25 DIAGNOSIS — R41841 Cognitive communication deficit: Secondary | ICD-10-CM | POA: Diagnosis not present

## 2017-03-25 DIAGNOSIS — F064 Anxiety disorder due to known physiological condition: Secondary | ICD-10-CM | POA: Diagnosis not present

## 2017-03-26 ENCOUNTER — Encounter (HOSPITAL_COMMUNITY)
Admission: RE | Admit: 2017-03-26 | Discharge: 2017-03-26 | Disposition: A | Discharge status: Home / Self Care | Payer: Medicare Other | Source: Skilled Nursing Facility | Attending: Internal Medicine | Admitting: Internal Medicine

## 2017-03-26 DIAGNOSIS — F064 Anxiety disorder due to known physiological condition: Secondary | ICD-10-CM | POA: Insufficient documentation

## 2017-03-26 DIAGNOSIS — E559 Vitamin D deficiency, unspecified: Secondary | ICD-10-CM | POA: Insufficient documentation

## 2017-03-26 DIAGNOSIS — R1312 Dysphagia, oropharyngeal phase: Secondary | ICD-10-CM | POA: Insufficient documentation

## 2017-03-26 DIAGNOSIS — Z4789 Encounter for other orthopedic aftercare: Secondary | ICD-10-CM | POA: Insufficient documentation

## 2017-03-26 LAB — CBC WITH DIFFERENTIAL/PLATELET
Basophils Absolute: 0 10*3/uL (ref 0.0–0.1)
Basophils Relative: 0 %
EOS ABS: 0.3 10*3/uL (ref 0.0–0.7)
EOS PCT: 5 %
HCT: 40.4 % (ref 36.0–46.0)
Hemoglobin: 13.6 g/dL (ref 12.0–15.0)
LYMPHS ABS: 1.4 10*3/uL (ref 0.7–4.0)
LYMPHS PCT: 29 %
MCH: 32 pg (ref 26.0–34.0)
MCHC: 33.7 g/dL (ref 30.0–36.0)
MCV: 95.1 fL (ref 78.0–100.0)
MONO ABS: 0.4 10*3/uL (ref 0.1–1.0)
MONOS PCT: 8 %
Neutro Abs: 2.9 10*3/uL (ref 1.7–7.7)
Neutrophils Relative %: 58 %
PLATELETS: 182 10*3/uL (ref 150–400)
RBC: 4.25 MIL/uL (ref 3.87–5.11)
RDW: 12.3 % (ref 11.5–15.5)
WBC: 4.9 10*3/uL (ref 4.0–10.5)

## 2017-03-29 DIAGNOSIS — F411 Generalized anxiety disorder: Secondary | ICD-10-CM | POA: Diagnosis not present

## 2017-03-29 DIAGNOSIS — F320 Major depressive disorder, single episode, mild: Secondary | ICD-10-CM | POA: Diagnosis not present

## 2017-03-31 DIAGNOSIS — F3289 Other specified depressive episodes: Secondary | ICD-10-CM | POA: Diagnosis not present

## 2017-03-31 DIAGNOSIS — Z4789 Encounter for other orthopedic aftercare: Secondary | ICD-10-CM | POA: Diagnosis not present

## 2017-03-31 DIAGNOSIS — F064 Anxiety disorder due to known physiological condition: Secondary | ICD-10-CM | POA: Diagnosis not present

## 2017-03-31 DIAGNOSIS — R41841 Cognitive communication deficit: Secondary | ICD-10-CM | POA: Diagnosis not present

## 2017-03-31 DIAGNOSIS — F0390 Unspecified dementia without behavioral disturbance: Secondary | ICD-10-CM | POA: Diagnosis not present

## 2017-04-01 DIAGNOSIS — F3289 Other specified depressive episodes: Secondary | ICD-10-CM | POA: Diagnosis not present

## 2017-04-01 DIAGNOSIS — F0390 Unspecified dementia without behavioral disturbance: Secondary | ICD-10-CM | POA: Diagnosis not present

## 2017-04-01 DIAGNOSIS — Z4789 Encounter for other orthopedic aftercare: Secondary | ICD-10-CM | POA: Diagnosis not present

## 2017-04-01 DIAGNOSIS — F064 Anxiety disorder due to known physiological condition: Secondary | ICD-10-CM | POA: Diagnosis not present

## 2017-04-01 DIAGNOSIS — R41841 Cognitive communication deficit: Secondary | ICD-10-CM | POA: Diagnosis not present

## 2017-04-02 DIAGNOSIS — F3289 Other specified depressive episodes: Secondary | ICD-10-CM | POA: Diagnosis not present

## 2017-04-02 DIAGNOSIS — Z4789 Encounter for other orthopedic aftercare: Secondary | ICD-10-CM | POA: Diagnosis not present

## 2017-04-02 DIAGNOSIS — F064 Anxiety disorder due to known physiological condition: Secondary | ICD-10-CM | POA: Diagnosis not present

## 2017-04-02 DIAGNOSIS — R41841 Cognitive communication deficit: Secondary | ICD-10-CM | POA: Diagnosis not present

## 2017-04-02 DIAGNOSIS — F0390 Unspecified dementia without behavioral disturbance: Secondary | ICD-10-CM | POA: Diagnosis not present

## 2017-04-06 DIAGNOSIS — F411 Generalized anxiety disorder: Secondary | ICD-10-CM | POA: Diagnosis not present

## 2017-04-06 DIAGNOSIS — F3289 Other specified depressive episodes: Secondary | ICD-10-CM | POA: Diagnosis not present

## 2017-04-06 DIAGNOSIS — F320 Major depressive disorder, single episode, mild: Secondary | ICD-10-CM | POA: Diagnosis not present

## 2017-04-06 DIAGNOSIS — R41841 Cognitive communication deficit: Secondary | ICD-10-CM | POA: Diagnosis not present

## 2017-04-06 DIAGNOSIS — Z4789 Encounter for other orthopedic aftercare: Secondary | ICD-10-CM | POA: Diagnosis not present

## 2017-04-06 DIAGNOSIS — F064 Anxiety disorder due to known physiological condition: Secondary | ICD-10-CM | POA: Diagnosis not present

## 2017-04-06 DIAGNOSIS — F0390 Unspecified dementia without behavioral disturbance: Secondary | ICD-10-CM | POA: Diagnosis not present

## 2017-04-07 DIAGNOSIS — F064 Anxiety disorder due to known physiological condition: Secondary | ICD-10-CM | POA: Diagnosis not present

## 2017-04-07 DIAGNOSIS — F0390 Unspecified dementia without behavioral disturbance: Secondary | ICD-10-CM | POA: Diagnosis not present

## 2017-04-07 DIAGNOSIS — R41841 Cognitive communication deficit: Secondary | ICD-10-CM | POA: Diagnosis not present

## 2017-04-07 DIAGNOSIS — F3289 Other specified depressive episodes: Secondary | ICD-10-CM | POA: Diagnosis not present

## 2017-04-07 DIAGNOSIS — Z4789 Encounter for other orthopedic aftercare: Secondary | ICD-10-CM | POA: Diagnosis not present

## 2017-04-08 DIAGNOSIS — Z4789 Encounter for other orthopedic aftercare: Secondary | ICD-10-CM | POA: Diagnosis not present

## 2017-04-08 DIAGNOSIS — F064 Anxiety disorder due to known physiological condition: Secondary | ICD-10-CM | POA: Diagnosis not present

## 2017-04-08 DIAGNOSIS — F0390 Unspecified dementia without behavioral disturbance: Secondary | ICD-10-CM | POA: Diagnosis not present

## 2017-04-08 DIAGNOSIS — F3289 Other specified depressive episodes: Secondary | ICD-10-CM | POA: Diagnosis not present

## 2017-04-08 DIAGNOSIS — R41841 Cognitive communication deficit: Secondary | ICD-10-CM | POA: Diagnosis not present

## 2017-04-09 DIAGNOSIS — R41841 Cognitive communication deficit: Secondary | ICD-10-CM | POA: Diagnosis not present

## 2017-04-09 DIAGNOSIS — F3289 Other specified depressive episodes: Secondary | ICD-10-CM | POA: Diagnosis not present

## 2017-04-09 DIAGNOSIS — F0390 Unspecified dementia without behavioral disturbance: Secondary | ICD-10-CM | POA: Diagnosis not present

## 2017-04-09 DIAGNOSIS — Z4789 Encounter for other orthopedic aftercare: Secondary | ICD-10-CM | POA: Diagnosis not present

## 2017-04-09 DIAGNOSIS — F064 Anxiety disorder due to known physiological condition: Secondary | ICD-10-CM | POA: Diagnosis not present

## 2017-04-12 DIAGNOSIS — F320 Major depressive disorder, single episode, mild: Secondary | ICD-10-CM | POA: Diagnosis not present

## 2017-04-12 DIAGNOSIS — F3289 Other specified depressive episodes: Secondary | ICD-10-CM | POA: Diagnosis not present

## 2017-04-12 DIAGNOSIS — Z4789 Encounter for other orthopedic aftercare: Secondary | ICD-10-CM | POA: Diagnosis not present

## 2017-04-12 DIAGNOSIS — F064 Anxiety disorder due to known physiological condition: Secondary | ICD-10-CM | POA: Diagnosis not present

## 2017-04-12 DIAGNOSIS — F0390 Unspecified dementia without behavioral disturbance: Secondary | ICD-10-CM | POA: Diagnosis not present

## 2017-04-12 DIAGNOSIS — R41841 Cognitive communication deficit: Secondary | ICD-10-CM | POA: Diagnosis not present

## 2017-04-12 DIAGNOSIS — F411 Generalized anxiety disorder: Secondary | ICD-10-CM | POA: Diagnosis not present

## 2017-04-13 DIAGNOSIS — F064 Anxiety disorder due to known physiological condition: Secondary | ICD-10-CM | POA: Diagnosis not present

## 2017-04-13 DIAGNOSIS — F0390 Unspecified dementia without behavioral disturbance: Secondary | ICD-10-CM | POA: Diagnosis not present

## 2017-04-13 DIAGNOSIS — F3289 Other specified depressive episodes: Secondary | ICD-10-CM | POA: Diagnosis not present

## 2017-04-13 DIAGNOSIS — R41841 Cognitive communication deficit: Secondary | ICD-10-CM | POA: Diagnosis not present

## 2017-04-13 DIAGNOSIS — Z4789 Encounter for other orthopedic aftercare: Secondary | ICD-10-CM | POA: Diagnosis not present

## 2017-04-14 DIAGNOSIS — F3289 Other specified depressive episodes: Secondary | ICD-10-CM | POA: Diagnosis not present

## 2017-04-14 DIAGNOSIS — Z4789 Encounter for other orthopedic aftercare: Secondary | ICD-10-CM | POA: Diagnosis not present

## 2017-04-14 DIAGNOSIS — F0390 Unspecified dementia without behavioral disturbance: Secondary | ICD-10-CM | POA: Diagnosis not present

## 2017-04-14 DIAGNOSIS — R41841 Cognitive communication deficit: Secondary | ICD-10-CM | POA: Diagnosis not present

## 2017-04-14 DIAGNOSIS — F064 Anxiety disorder due to known physiological condition: Secondary | ICD-10-CM | POA: Diagnosis not present

## 2017-04-15 ENCOUNTER — Non-Acute Institutional Stay (SKILLED_NURSING_FACILITY): Payer: Medicare Other | Admitting: Internal Medicine

## 2017-04-15 ENCOUNTER — Encounter: Payer: Self-pay | Admitting: Internal Medicine

## 2017-04-15 DIAGNOSIS — F0390 Unspecified dementia without behavioral disturbance: Secondary | ICD-10-CM

## 2017-04-15 DIAGNOSIS — M810 Age-related osteoporosis without current pathological fracture: Secondary | ICD-10-CM

## 2017-04-15 DIAGNOSIS — F3289 Other specified depressive episodes: Secondary | ICD-10-CM | POA: Diagnosis not present

## 2017-04-15 DIAGNOSIS — F339 Major depressive disorder, recurrent, unspecified: Secondary | ICD-10-CM | POA: Diagnosis not present

## 2017-04-15 DIAGNOSIS — R41841 Cognitive communication deficit: Secondary | ICD-10-CM | POA: Diagnosis not present

## 2017-04-15 DIAGNOSIS — E559 Vitamin D deficiency, unspecified: Secondary | ICD-10-CM | POA: Diagnosis not present

## 2017-04-15 DIAGNOSIS — Z4789 Encounter for other orthopedic aftercare: Secondary | ICD-10-CM | POA: Diagnosis not present

## 2017-04-15 DIAGNOSIS — F064 Anxiety disorder due to known physiological condition: Secondary | ICD-10-CM | POA: Diagnosis not present

## 2017-04-15 NOTE — Progress Notes (Signed)
Location:   [facility] Room Number: 154/D Place of Service:  SNF [phone]) Provider:  Lenon Curt, Freddie Breech, MD  Patient Care Team: Mahlon Gammon, MD as PCP - General (Internal Medicine) Roena Malady, PA-C as Physician Assistant (Internal Medicine)  Extended Emergency Contact Information Primary Emergency Contact: [facility] Address: [address] [place], [place] [address] [place] of [place] Home Phone: [phone] Mobile Phone: [phone] Relation: Daughter Secondary Emergency Contact: Gena Fray [place] Mobile Phone: [phone] Relation: Son  Code Status:  Full Code Goals of care: Advanced Directive information Advanced Directives 04/15/2017  Does Patient Have a Medical Advance Directive? Yes  Type of Advance Directive (No Data)  Does patient want to make changes to medical advance directive? No - Patient declined  Copy of Healthcare Power of Attorney in Chart? No - copy requested  Would patient like information on creating a medical advance directive? No - Patient declined     Chief Complaint  Patient presents with  . Medical Management of Chronic Issues    Routine Visit,Due Prevnar 13,and Flu Shot    HPI:  Pt is a 76 y.o. female seen today for medical management of chronic diseases.    She has h/o Right Femoral Fracture due to fall, Depression and dementia., Osteoporosis She has been stable in the Facility. Weight Stable at 123.8 lbs. Eating well and No New Nursing Issues. The Therapy had mentioned to me that they have seen some decline in patient responsiveness and general Cognition. Patient unable to give any history due to her dementia   Past Medical History:  Diagnosis Date  . Acute encephalopathy 2012   In context of diarrhea and clinical dehydration  . Dementia    Noncompliant with Aricept as per daughter  . Protein-calorie malnutrition, severe (HCC)   . Underweight   .  Vertigo    Past Surgical History:  Procedure Laterality Date  . COMPRESSION HIP SCREW Right 09/21/2015   Procedure: COMPRESSION HIP;  Surgeon: Vickki Hearing, MD;  Location: [facility];  Service: Orthopedics;  Laterality: Right;    Allergies  Allergen Reactions  . Beef-Derived Products     Outpatient Encounter Prescriptions as of 04/15/2017  Medication Sig  . acetaminophen (TYLENOL) 325 MG tablet Take 2 tablets (650 mg total) by mouth every 6 (six) hours as needed for mild pain (or Fever >/= 101).  Marland Kitchen alendronate (FOSAMAX) 70 MG tablet Take 70 mg by mouth once a week. Take with a full glass of water on an empty stomach.  Marland Kitchen aspirin EC 325 MG EC tablet Take 1 tablet (325 mg total) by mouth daily with breakfast.  . calcium carbonate (OS-CAL - DOSED IN MG OF ELEMENTAL CALCIUM) 1250 (500 Ca) MG tablet Take 1 tablet by mouth 2 (two) times daily with a meal.  . Cholecalciferol 1000 units tablet Take 1,000 Units by mouth daily.  Marland Kitchen donepezil (ARICEPT) 5 MG tablet Take 5 mg by mouth at bedtime.  . senna-docusate (SENOKOT-S) 8.6-50 MG tablet Take 1 tablet by mouth 2 (two) times daily.  . sertraline (ZOLOFT) 25 MG tablet Take 25 mg by mouth daily.  Marland Kitchen triamcinolone (KENALOG) 0.025 % cream Apply 1 application topically 2 (two) times daily.  . [DISCONTINUED] Cholecalciferol 50000 units TABS Take 50,000 Units by mouth daily. To be taken on Monday   No facility-administered encounter medications on file as of 04/15/2017.      Review  of Systems  Unable to perform ROS: Dementia    Immunization History  Administered Date(s) Administered  . Influenza-Unspecified 03/20/2016  . PPD Test 06/12/2015  . Pneumococcal-Unspecified 04/02/2016   Pertinent  Health Maintenance Due  Topic Date Due  . INFLUENZA VACCINE  05/22/2017 (Originally 01/20/2017)  . PNA vac Low Risk Adult (2 of 2 - PCV13) 05/22/2017 (Originally 04/02/2017)  . DEXA SCAN  Completed   Fall Risk  03/01/2017  Falls in the past year? No    Functional Status Survey:    Vitals:   04/15/17 1116  BP: 131/75  Pulse: 99  Resp: 20  Temp: 98.1 F (36.7 C)  TempSrc: Oral  Weight: 123 lb 12.8 oz (56.2 kg)  Height: 5\' 2"  (1.575 m)   Body mass index is 22.64 kg/m. Physical Exam  Constitutional: She appears well-developed and well-nourished.  HENT:  Head: Normocephalic.  Mouth/Throat: Oropharynx is clear and moist.  Eyes: Pupils are equal, round, and reactive to light.  Neck: Neck supple.  Cardiovascular: Normal rate and normal heart sounds.   Pulmonary/Chest: Effort normal and breath sounds normal. No respiratory distress. She has no wheezes. She has no rales.  Abdominal: Soft. Bowel sounds are normal. She exhibits no distension. There is no tenderness. There is no rebound.  Musculoskeletal:  Trace edema Bilateral.  Neurological: She is alert.  Follows Some Commands  Skin: Skin is warm and dry.  Psychiatric: She has a normal mood and affect. Her behavior is normal.    Labs reviewed:  Recent Labs  07/23/16 0700 09/22/16 0700 02/19/17 0250  NA 137 138 138  K 4.1 3.9 4.0  CL 101 100* 103  CO2 30 32 30  GLUCOSE 97 129* 92  BUN 18 19 18   CREATININE 0.61 0.68 0.62  CALCIUM 9.0 9.5 8.7*    Recent Labs  07/23/16 0700 09/22/16 0700 02/19/17 0250  AST 32 24 26  ALT 27 19 25   ALKPHOS 54 64 57  BILITOT 0.5 0.7 0.6  PROT 6.1* 7.2 6.0*  ALBUMIN 3.5 3.9 3.4*    Recent Labs  09/22/16 0700 02/19/17 0250 03/26/17 0700  WBC 4.1 6.0 4.9  NEUTROABS 2.2 2.7 2.9  HGB 14.3 12.5 13.6  HCT 43.1 36.9 40.4  MCV 97.7 94.9 95.1  PLT 205 180 182   Lab Results  Component Value Date   TSH 2.391 02/19/2017   Lab Results  Component Value Date   HGBA1C 5.4 01/10/2016   No results found for: CHOL, HDL, LDLCALC, LDLDIRECT, TRIG, CHOLHDL  Significant Diagnostic Results in last 30 days:  No results found.  Assessment/Plan  Osteoporosis Patient DEXA imaging shows T Score of -2.88 Patient On Fosamax and seem  to be tolerating.  Vitamin D deficiency On Supplement  Depression,  On Zoloft Dementia  On Aricept.She had MRI done 2016 which showed Cerebral Atrophy with Chronic Vascular Changes Will also start her On Namenda to decrease rapid Cognition decline. Start her on 5 mg and slowly go up to 10 mg BID  Labs/tests ordered:     Total time spent in this patient care encounter was _25 minutes; greater than 50% of the visit spent counseling patient, reviewing records , Labs and coordinating care for problems addressed at this encounter.

## 2017-04-17 DIAGNOSIS — R41841 Cognitive communication deficit: Secondary | ICD-10-CM | POA: Diagnosis not present

## 2017-04-17 DIAGNOSIS — F064 Anxiety disorder due to known physiological condition: Secondary | ICD-10-CM | POA: Diagnosis not present

## 2017-04-17 DIAGNOSIS — F3289 Other specified depressive episodes: Secondary | ICD-10-CM | POA: Diagnosis not present

## 2017-04-17 DIAGNOSIS — Z4789 Encounter for other orthopedic aftercare: Secondary | ICD-10-CM | POA: Diagnosis not present

## 2017-04-17 DIAGNOSIS — F0390 Unspecified dementia without behavioral disturbance: Secondary | ICD-10-CM | POA: Diagnosis not present

## 2017-04-19 DIAGNOSIS — F320 Major depressive disorder, single episode, mild: Secondary | ICD-10-CM | POA: Diagnosis not present

## 2017-04-19 DIAGNOSIS — F411 Generalized anxiety disorder: Secondary | ICD-10-CM | POA: Diagnosis not present

## 2017-05-03 DIAGNOSIS — F320 Major depressive disorder, single episode, mild: Secondary | ICD-10-CM | POA: Diagnosis not present

## 2017-05-03 DIAGNOSIS — F411 Generalized anxiety disorder: Secondary | ICD-10-CM | POA: Diagnosis not present

## 2017-05-10 DIAGNOSIS — F411 Generalized anxiety disorder: Secondary | ICD-10-CM | POA: Diagnosis not present

## 2017-05-10 DIAGNOSIS — F320 Major depressive disorder, single episode, mild: Secondary | ICD-10-CM | POA: Diagnosis not present

## 2017-05-17 DIAGNOSIS — F320 Major depressive disorder, single episode, mild: Secondary | ICD-10-CM | POA: Diagnosis not present

## 2017-05-17 DIAGNOSIS — F411 Generalized anxiety disorder: Secondary | ICD-10-CM | POA: Diagnosis not present

## 2017-05-25 DIAGNOSIS — F411 Generalized anxiety disorder: Secondary | ICD-10-CM | POA: Diagnosis not present

## 2017-05-25 DIAGNOSIS — F320 Major depressive disorder, single episode, mild: Secondary | ICD-10-CM | POA: Diagnosis not present

## 2017-06-03 ENCOUNTER — Encounter: Payer: Self-pay | Admitting: Internal Medicine

## 2017-06-03 ENCOUNTER — Non-Acute Institutional Stay (SKILLED_NURSING_FACILITY): Payer: Medicare Other | Admitting: Internal Medicine

## 2017-06-03 DIAGNOSIS — F339 Major depressive disorder, recurrent, unspecified: Secondary | ICD-10-CM | POA: Diagnosis not present

## 2017-06-03 DIAGNOSIS — E559 Vitamin D deficiency, unspecified: Secondary | ICD-10-CM | POA: Diagnosis not present

## 2017-06-03 DIAGNOSIS — M810 Age-related osteoporosis without current pathological fracture: Secondary | ICD-10-CM

## 2017-06-03 DIAGNOSIS — F0390 Unspecified dementia without behavioral disturbance: Secondary | ICD-10-CM

## 2017-06-03 NOTE — Progress Notes (Signed)
Location:   [facility] Room Number: 154/D Place of Service:  SNF (31) Provider:    Mahlon GammonGupta, Anjali L, MD  Patient Care Team: Mahlon GammonGupta, Anjali L, MD as PCP - General (Internal Medicine) Roena MaladyLassen, Vaida Kerchner C, PA-C as Physician Assistant (Internal Medicine)  Extended Emergency Contact Information Primary Emergency Contact: [facility],Shannon Address: [address]          [place], [place] [address] [place] Home Phone: [phone] Mobile Phone: [phone] Relation: Daughter Secondary Emergency Contact: Gena Frayurner,Clark  [place] Mobile Phone: [phone] Relation: Son  Code Status:  Full Code Goals of care: Advanced Directive information Advanced Directives 06/03/2017  Does Patient Have a Medical Advance Directive? Yes  Type of Advance Directive (No Data)  Does patient want to make changes to medical advance directive? No - Patient declined  Copy of Healthcare Power of Attorney in Chart? No - copy requested  Would patient like information on creating a medical advance directive? No - Patient declined     Chief Complaint  Patient presents with  . Medical Management of Chronic Issues    Routine Visit  For medical management of chronic medical issues including dementia-osteoporosis with history of right femoral fracture-depression and vitamin D deficiency.    HPI:  Pt is a 76 y.o. female seen today for medical management of chronic diseases.  As noted above-nursing staff does not report any recent acute issues-she does have a history of significant dementia she is on Aricept and was recently started on Namenda as well by Dr.Gupta--she appears to be doing well with supportive care her weight is stable at about 122 pounds fact she was eating her dinner when I saw her this evening.  She does have a history of a right femoral fracture and osteoporosis T score was -2.88 and she has been started on Fosamax and appears to be tolerating  this well.  She is also on vitamin D supplementation.  Along with calcium  Regards to depression she is on Zoloft.  Tonight she did not speak much but when I was told that she likes to be called Ginger-appeared to talk some more.  She does not complain of any pain or discomfort again nursing does not report any issues.  Vital signs appear to be stable blood pressure manually tonight was 122/80   Past Medical History:  Diagnosis Date  . Acute encephalopathy 2012   In context of diarrhea and clinical dehydration  . Dementia    Noncompliant with Aricept as per daughter  . Protein-calorie malnutrition, severe (HCC)   . Underweight   . Vertigo    Past Surgical History:  Procedure Laterality Date  . COMPRESSION HIP SCREW Right 09/21/2015   Procedure: COMPRESSION HIP;  Surgeon: Vickki HearingStanley E Harrison, MD;  Location: [facility];  Service: Orthopedics;  Laterality: Right;    Allergies  Allergen Reactions  . Beef-Derived Products     Outpatient Encounter Medications as of 06/03/2017  Medication Sig  . acetaminophen (TYLENOL) 325 MG tablet Take 2 tablets (650 mg total) by mouth every 6 (six) hours as needed for mild pain (or Fever >/= 101).  Marland Kitchen. alendronate (FOSAMAX) 70 MG tablet Take 70 mg by mouth once a week. Take with a full glass of water on an empty stomach.  Marland Kitchen. aspirin EC 325 MG EC tablet Take 1 tablet (325 mg total) by mouth daily with breakfast.  . calcium carbonate (OS-CAL - DOSED IN MG OF ELEMENTAL CALCIUM) 1250 (500 Ca) MG  tablet Take 1 tablet by mouth 2 (two) times daily with a meal.  . Cholecalciferol 1000 units tablet Take 1,000 Units by mouth daily.  Marland Kitchen. donepezil (ARICEPT) 5 MG tablet Take 5 mg by mouth at bedtime.  . memantine (NAMENDA) 5 MG tablet Take 5 mg by mouth 2 (two) times daily.  Marland Kitchen. senna-docusate (SENOKOT-S) 8.6-50 MG tablet Take 1 tablet by mouth 2 (two) times daily.  . sertraline (ZOLOFT) 25 MG tablet Take 25 mg by mouth daily.  Marland Kitchen. triamcinolone (KENALOG) 0.025 % cream  Apply 1 application topically 2 (two) times daily.   No facility-administered encounter medications on file as of 06/03/2017.      Review of Systems   This is largely unobtainable secondary to dementia but when asked she is not complaining of any discomfort shortness of breath-nursing staff does not report any issues-  Immunization History  Administered Date(s) Administered  . Influenza-Unspecified 03/20/2016  . PPD Test 06/12/2015  . Pneumococcal-Unspecified 04/02/2016   Pertinent  Health Maintenance Due  Topic Date Due  . INFLUENZA VACCINE  01/20/2017  . PNA vac Low Risk Adult (2 of 2 - PCV13) 04/02/2017  . DEXA SCAN  Completed   Fall Risk  03/01/2017  Falls in the past year? No   Functional Status Survey:    Vitals:   06/03/17 1459  BP: (!) 133/96  Pulse: 90  Resp: 20  Temp: 97.9 F (36.6 C)  TempSrc: Oral  Weight: 121 lb 12.8 oz (55.2 kg)  Height: 5\' 2"  (1.575 m)  Of note manual blood pressure was 120/80 Body mass index is 22.28 kg/m. Physical Exam n general this is a pleasant elderly female in no distress she is somewhat frail but appears to be baseline   Her skin is warm and dry.  Eyes sclera and conjunctive are clear she has prescription lenses visual acuity appears grossly intact.  Oropharynx appears to be is clear with mucous membranes moist somewhat limited exam since she does have some food residue and is eating.   Abdomen is soft nontender with positive bowel sounds.    Heart is regular rate and rhythm without murmur gallop or rub she has trace lower extremity edema.  Chest is clear to auscultation there is no labored breathing   Musculoskeletal Limited exam since she is in bed but appears able to move all extremities x4 with lower extremity weakness and general frailty.  Neurologic appears grossly intact her speech is clear although she does not speak much-cranial nerves appear grossly intact   Psych she does follow some verbal commands is  pleasant smiling when I called her"Ginger" she did perk up and said hello   Labs reviewed: Recent Labs    07/23/16 0700 09/22/16 0700 02/19/17 0250  NA 137 138 138  K 4.1 3.9 4.0  CL 101 100* 103  CO2 30 32 30  GLUCOSE 97 129* 92  BUN 18 19 18   CREATININE 0.61 0.68 0.62  CALCIUM 9.0 9.5 8.7*   Recent Labs    07/23/16 0700 09/22/16 0700 02/19/17 0250  AST 32 24 26  ALT 27 19 25   ALKPHOS 54 64 57  BILITOT 0.5 0.7 0.6  PROT 6.1* 7.2 6.0*  ALBUMIN 3.5 3.9 3.4*   Recent Labs    09/22/16 0700 02/19/17 0250 03/26/17 0700  WBC 4.1 6.0 4.9  NEUTROABS 2.2 2.7 2.9  HGB 14.3 12.5 13.6  HCT 43.1 36.9 40.4  MCV 97.7 94.9 95.1  PLT 205 180 182   Lab Results  Component Value Date   TSH 2.391 02/19/2017   Lab Results  Component Value Date   HGBA1C 5.4 01/10/2016   No results found for: CHOL, HDL, LDLCALC, LDLDIRECT, TRIG, CHOLHDL  Significant Diagnostic Results in last 30 days:  No results found.  Assessment/Plan  #1 history of dementia she appears to be doing well with supportive care weight has been stable she is on Aricept and Namenda was recently started as well- at this point will monitor.  2.  History of osteoporosis with previous history of right femoral fracture this appears to be stable she is largely nonambulatory she is on Fosamax.  3.  History of depression this appears stable on Zoloft once I addressed her by "Ginger"-she perked up pleasant smiling.  4.  History of vitamin D deficiency she is on supplementation-vitamin D level in August 2018 was 2.3.  Of note will update a CBC and metabolic panel for updated values these appear to be stable on previous labs  [phone]-

## 2017-06-10 DIAGNOSIS — F4323 Adjustment disorder with mixed anxiety and depressed mood: Secondary | ICD-10-CM | POA: Diagnosis not present

## 2017-06-10 DIAGNOSIS — F321 Major depressive disorder, single episode, moderate: Secondary | ICD-10-CM | POA: Diagnosis not present

## 2017-06-10 DIAGNOSIS — F0390 Unspecified dementia without behavioral disturbance: Secondary | ICD-10-CM | POA: Diagnosis not present

## 2017-06-16 ENCOUNTER — Encounter: Payer: Self-pay | Admitting: Internal Medicine

## 2017-06-16 ENCOUNTER — Non-Acute Institutional Stay (SKILLED_NURSING_FACILITY): Payer: Medicare Other | Admitting: Internal Medicine

## 2017-06-16 DIAGNOSIS — M810 Age-related osteoporosis without current pathological fracture: Secondary | ICD-10-CM | POA: Diagnosis not present

## 2017-06-16 DIAGNOSIS — R0681 Apnea, not elsewhere classified: Secondary | ICD-10-CM

## 2017-06-16 DIAGNOSIS — Z66 Do not resuscitate: Secondary | ICD-10-CM | POA: Diagnosis not present

## 2017-06-16 NOTE — Assessment & Plan Note (Signed)
Undoubtedly external chest compression will be associated with fractured sternum and multiple ribs with no meaningful chance of returning even to her present baseline

## 2017-06-16 NOTE — Progress Notes (Signed)
    This is a nursing facility follow up for specific acute issue of clinical apnea. I entered the patient's room actually to see her roommate. I observed intermittent apnea of several seconds duration.   Interim medical record and care since last [facility] visit was updated with review of diagnostic studies and change in clinical status since last visit were documented.  HPI: The patient is a long-term resident of the facility with diagnoses of dementia & history of encephalopathy (in the context of diarrhea and clinical dehydration in 2012). She also has diagnoses of severe protein-caloric malnutrition and osteoporosis.  Family history was reviewed, noncontributory other than dementia in her mother. The patient never smoked, she previously drank alcoholic beverages occasionally.  Review of systems: Dementia prevented completion of review of systems. She was unable to give me a date.She did deny history of sleep apnea.   Physical exam:  Pertinent or positive findings: She appears pale. As noted initially she was asleep with intermittent apnea. Awakening she simply stares blankly at the examiner. Surprisingly she follows simple commands such as holding up a set number of fingers on her hand bilaterally. When she does speak it's essentially unintelligible. Her voice is very weak, stuttering, and cracking in character. Heart rhythm is slow and regular. She exhibits an intermittent nonproductive, rattly cough. She has diffuse low-grade rhonchi. Trace edema is present at the sock line. Pedal pulses are surprisingly good. She is generally weak but appears more so in the left upper extremity. She has intermittent jerking of the left upper extremity.  General appearance:Adequately nourished; no acute distress , increased work of breathing is present.   Lymphatic: No lymphadenopathy about the head, neck, axilla . Eyes: No conjunctival inflammation or lid edema is present. There is no  scleral icterus. Ears:  External ear exam shows no significant lesions or deformities.   Nose:  External nasal examination shows no deformity or inflammation. Nasal mucosa are pink and moist without lesions ,exudates Oral exam: lips and gums are healthy appearing.There is no oropharyngeal erythema or exudate . Neck:  No thyromegaly, masses, tenderness noted.    Heart:  No gallop, murmur, click, rub .  Abdomen:Bowel sounds are normal. Abdomen is soft and nontender with no organomegaly, hernias,masses. GU: deferred  Extremities:  No cyanosis, clubbing Neurologic exam : Balance,Rhomberg,finger to nose testing could not be completed due to clinical state Skin: Warm & dry w/o tenting. No significant lesions or rash.  #1 possible sleep apnea with intermittent, recurrent apneic spells lasting several seconds #2 advanced dementia #3 osteoporosis #4 DNR status  Problematic is that the patient is a full code despite multiple advanced comorbidities. These include dementia as well as osteoporosis. If patient does have sleep apnea she is @ high risk for right-sided heart failure and/or potentially malignant dysrhythmias. These would make resuscitation a probable intervention. With her osteoporosis she would not likely survive intervention.

## 2017-06-16 NOTE — Patient Instructions (Signed)
See assessment and plan under each diagnosis in the problem list and acutely for this visit 

## 2017-06-16 NOTE — Assessment & Plan Note (Signed)
If she remains  DO NOT RESUSCITATE, sleep study is appropriate. Untreated sleep apnea would put her at risk for right-sided heart failure and/or malignant dysrhythmias. These would make resuscitative interventions a ikely event

## 2017-06-16 NOTE — Assessment & Plan Note (Addendum)
The benefits & harms of resuscitation interventions in her case should  be reviewed with her POA. This process would be an attempt to provide POA with informed consent as to appropriate, beneficial health care interventions; it is not to deny patient care. The main emphasis would on avoidance of external compression which would certainly be associated with sternal & multiple rib fractures due to senile osteoporosis.   In the presence of advanced and serious comorbid conditions as are present; no study has shown any benefit for these interventions. Unfortunately it simply prolongs the process of dying and and painfully so.   It is strongly recommend that a "Limited Code" be considered. This would entail treating reversible processes such as serious heart irregularities with medications or even cardioversion; medication for pain control; and antibiotics for infections.    An incredibly sensitive and compassionate discussion of these issues is found in the book Being Mortal by Dr Vivi FernsAtul Gawande.

## 2017-06-18 ENCOUNTER — Encounter: Payer: Self-pay | Admitting: Internal Medicine

## 2017-06-18 ENCOUNTER — Encounter (HOSPITAL_COMMUNITY)
Admission: AD | Admit: 2017-06-18 | Discharge: 2017-06-18 | Disposition: A | Discharge status: Home / Self Care | Payer: Medicare Other | Source: Skilled Nursing Facility | Attending: Internal Medicine | Admitting: Internal Medicine

## 2017-06-18 ENCOUNTER — Non-Acute Institutional Stay (SKILLED_NURSING_FACILITY): Payer: Medicare Other | Admitting: Internal Medicine

## 2017-06-18 DIAGNOSIS — R05 Cough: Secondary | ICD-10-CM

## 2017-06-18 DIAGNOSIS — R509 Fever, unspecified: Secondary | ICD-10-CM | POA: Insufficient documentation

## 2017-06-18 DIAGNOSIS — R0989 Other specified symptoms and signs involving the circulatory and respiratory systems: Secondary | ICD-10-CM | POA: Diagnosis not present

## 2017-06-18 DIAGNOSIS — R059 Cough, unspecified: Secondary | ICD-10-CM

## 2017-06-18 LAB — INFLUENZA PANEL BY PCR (TYPE A & B)
Influenza A By PCR: NEGATIVE
Influenza B By PCR: NEGATIVE

## 2017-06-18 NOTE — Progress Notes (Signed)
Location:   [facility] Room Number: 154/D Place of Service:  SNF [phone](31) Provider:  Sabino DickArlo Maragret Vanacker  Gupta, Anjali L, MD  Patient Care Team: Mahlon GammonGupta, Anjali L, MD as PCP - General (Internal Medicine) Synetta ShadowLassen, Ademola Vert C, PA-C as Physician Assistant (Internal Medicine)  Extended Emergency Contact Information Primary Emergency Contact: [facility] Address: [address]          [place], [place] [address] [place] Home Phone: [phone] Mobile Phone: [phone] Relation: Daughter Secondary Emergency Contact: Gena Frayurner,Clark  [place] Mobile Phone: [phone] Relation: Son  Code Status:  Full Code Goals of care: Advanced Directive information Advanced Directives 06/18/2017  Does Patient Have a Medical Advance Directive? Yes  Type of Advance Directive (No Data)  Does patient want to make changes to medical advance directive? No - Patient declined  Copy of Healthcare Power of Attorney in Chart? No - copy requested  Would patient like information on creating a medical advance directive? No - Patient declined     Chief Complaint  Patient presents with  . Acute Visit    Patients c/o Cold, Cough, and Congestion    HPI:  Pt is a 76 y.o. female seen today for an acute visit for nursing staff noting some increased cough and possible chest congestion.  Patient has significant dementia and cannot really give any review of systems.  She also has a history of a right femoral fracture with osteoporosis she is on Fosamax also on vitamin D supplementation.  He is also on Zoloft for depression.  Apparently earlier today nursing staff noted some increased cough following up on this.  Vital signs appear to be stable except for an elevated temperature of 99.8.         Past Medical History:  Diagnosis Date  . Acute encephalopathy 2012   In context of diarrhea and clinical dehydration  . Dementia    Noncompliant with Aricept  as per daughter  . Protein-calorie malnutrition, severe (HCC)   . Underweight   . Vertigo    Past Surgical History:  Procedure Laterality Date  . COMPRESSION HIP SCREW Right 09/21/2015   Procedure: COMPRESSION HIP;  Surgeon: Vickki HearingStanley E Harrison, MD;  Location: [facility];  Service: Orthopedics;  Laterality: Right;    Allergies  Allergen Reactions  . Beef-Derived Products     Outpatient Encounter Medications as of 06/18/2017  Medication Sig  . acetaminophen (TYLENOL) 325 MG tablet Take 2 tablets (650 mg total) by mouth every 6 (six) hours as needed for mild pain (or Fever >/= 101).  Marland Kitchen. alendronate (FOSAMAX) 70 MG tablet Take 70 mg by mouth once a week. Take with a full glass of water on an empty stomach.  Marland Kitchen. aspirin EC 325 MG EC tablet Take 1 tablet (325 mg total) by mouth daily with breakfast.  . calcium carbonate (OS-CAL - DOSED IN MG OF ELEMENTAL CALCIUM) 1250 (500 Ca) MG tablet Take 1 tablet by mouth 2 (two) times daily with a meal.  . Cholecalciferol 1000 units tablet Take 1,000 Units by mouth daily.  Marland Kitchen. donepezil (ARICEPT) 5 MG tablet Take 5 mg by mouth at bedtime.  . memantine (NAMENDA) 5 MG tablet Take 5 mg by mouth 2 (two) times daily.  Marland Kitchen. senna-docusate (SENOKOT-S) 8.6-50 MG tablet Take 1 tablet by mouth 2 (two) times daily.  . sertraline (ZOLOFT) 25 MG tablet Take 25 mg by mouth daily.  Marland Kitchen. triamcinolone (KENALOG) 0.025 % cream Apply 1 application topically 2 (two)  times daily.   No facility-administered encounter medications on file as of 06/18/2017.     Review of Systems is essentially unobtainable please see HPI    Immunization History  Administered Date(s) Administered  . Influenza-Unspecified 03/20/2016, 05/03/2017  . PPD Test 06/12/2015  . Pneumococcal Conjugate-13 05/03/2017  . Pneumococcal-Unspecified 04/02/2016  . Tdap 03/04/2017   Pertinent  Health Maintenance Due  Topic Date Due  . INFLUENZA VACCINE  Completed  . DEXA SCAN  Completed  . PNA vac Low Risk Adult   Completed   Fall Risk  03/01/2017  Falls in the past year? No   Functional Status Survey:    Vitals:   06/18/17 1406  BP: 137/82  Pulse: 94  Resp: 18  Temp: 99.8 F (37.7 C)  TempSrc: Oral    Physical Exam   In general this is a frail elderly female in no distress in her wheelchair.  She appears relatively at baseline.  Her skin is warm and dry.  Eyes sclera and conjunctive appear to be clear I do not see any drainage.  Oropharynx is clear mucous membranes appear fairly moist.  Chest is clear to auscultation with poor respiratory effort.  Heart is regular rate and rhythm without murmur gallop or rub she has trace lower extremity edema.  Abdomen is soft nontender with positive bowel sounds.  Musculoskeletal he is ambulatory in a wheelchair is able to move all extremities x4 but has generalized weakness which does not appear to be new.  Neurologic could not really appreciate lateralizing findings he speaks quite minimally which is not.  Psych she does follow at times some simple verbal commands findings consistent with significant dementia  Labs reviewed: Recent Labs    07/23/16 0700 09/22/16 0700 02/19/17 0250  NA 137 138 138  K 4.1 3.9 4.0  CL 101 100* 103  CO2 30 32 30  GLUCOSE 97 129* 92  BUN 18 19 18   CREATININE 0.61 0.68 0.62  CALCIUM 9.0 9.5 8.7*   Recent Labs    07/23/16 0700 09/22/16 0700 02/19/17 0250  AST 32 24 26  ALT 27 19 25   ALKPHOS 54 64 57  BILITOT 0.5 0.7 0.6  PROT 6.1* 7.2 6.0*  ALBUMIN 3.5 3.9 3.4*   Recent Labs    09/22/16 0700 02/19/17 0250 03/26/17 0700  WBC 4.1 6.0 4.9  NEUTROABS 2.2 2.7 2.9  HGB 14.3 12.5 13.6  HCT 43.1 36.9 40.4  MCV 97.7 94.9 95.1  PLT 205 180 182   Lab Results  Component Value Date   TSH 2.391 02/19/2017   Lab Results  Component Value Date   HGBA1C 5.4 01/10/2016   No results found for: CHOL, HDL, LDLCALC, LDLDIRECT, TRIG, CHOLHDL  Significant Diagnostic Results in last 30 days:  No  results found.  Assessment/Plan    #1-cough possible congestion-physical exam was fairly benign however nursing has noted some coughing will order a chest x-ray two-view- also will start Mucinex 600 mg twice daily for 5 days-monitor vital signs and pulse ox  every shift-.  Of note I did reevaluate patient later in the day and her temperature actually was 100.4--  Physical exam was relatively unchanged she was lying in bed appear to be comfortable she was not diaphoretic she  actually had eaten her supper and was eating some ice cream.  Will order a CBC with differential and metabolic panel monitor vital signs pulse ox every 4 hours will  await  the results of the chest x-ray  [phone]

## 2017-06-19 ENCOUNTER — Non-Acute Institutional Stay (SKILLED_NURSING_FACILITY): Payer: Medicare Other | Admitting: Internal Medicine

## 2017-06-19 ENCOUNTER — Encounter (HOSPITAL_COMMUNITY)
Admission: RE | Admit: 2017-06-19 | Discharge: 2017-06-19 | Disposition: A | Discharge status: Home / Self Care | Payer: Medicare Other | Source: Skilled Nursing Facility | Attending: *Deleted | Admitting: *Deleted

## 2017-06-19 ENCOUNTER — Encounter: Payer: Self-pay | Admitting: Internal Medicine

## 2017-06-19 DIAGNOSIS — R509 Fever, unspecified: Secondary | ICD-10-CM

## 2017-06-19 DIAGNOSIS — J189 Pneumonia, unspecified organism: Secondary | ICD-10-CM

## 2017-06-19 DIAGNOSIS — J181 Lobar pneumonia, unspecified organism: Secondary | ICD-10-CM

## 2017-06-19 LAB — COMPREHENSIVE METABOLIC PANEL
ALT: 48 U/L (ref 14–54)
ANION GAP: 11 (ref 5–15)
AST: 43 U/L — ABNORMAL HIGH (ref 15–41)
Albumin: 3.8 g/dL (ref 3.5–5.0)
Alkaline Phosphatase: 66 U/L (ref 38–126)
BUN: 10 mg/dL (ref 6–20)
CO2: 28 mmol/L (ref 22–32)
Calcium: 9 mg/dL (ref 8.9–10.3)
Chloride: 100 mmol/L — ABNORMAL LOW (ref 101–111)
Creatinine, Ser: 0.62 mg/dL (ref 0.44–1.00)
Glucose, Bld: 103 mg/dL — ABNORMAL HIGH (ref 65–99)
POTASSIUM: 3.5 mmol/L (ref 3.5–5.1)
Sodium: 139 mmol/L (ref 135–145)
TOTAL PROTEIN: 7.6 g/dL (ref 6.5–8.1)
Total Bilirubin: 0.8 mg/dL (ref 0.3–1.2)

## 2017-06-19 LAB — CBC WITH DIFFERENTIAL/PLATELET
BASOS ABS: 0 10*3/uL (ref 0.0–0.1)
Basophils Relative: 0 %
EOS PCT: 4 %
Eosinophils Absolute: 0.2 10*3/uL (ref 0.0–0.7)
HEMATOCRIT: 41.5 % (ref 36.0–46.0)
Hemoglobin: 13.5 g/dL (ref 12.0–15.0)
LYMPHS PCT: 22 %
Lymphs Abs: 1.2 10*3/uL (ref 0.7–4.0)
MCH: 31 pg (ref 26.0–34.0)
MCHC: 32.5 g/dL (ref 30.0–36.0)
MCV: 95.2 fL (ref 78.0–100.0)
MONO ABS: 0.4 10*3/uL (ref 0.1–1.0)
MONOS PCT: 8 %
Neutro Abs: 3.6 10*3/uL (ref 1.7–7.7)
Neutrophils Relative %: 66 %
PLATELETS: 157 10*3/uL (ref 150–400)
RBC: 4.36 MIL/uL (ref 3.87–5.11)
RDW: 11.9 % (ref 11.5–15.5)
WBC: 5.4 10*3/uL (ref 4.0–10.5)

## 2017-06-19 NOTE — Progress Notes (Signed)
Location:   [facility] Room Number: 154/D Place of Service:  SNF [phone]) Provider:  Sabino DickArlo Malcolm Quast  Gupta, Anjali L, MD  Patient Care Team: Mahlon GammonGupta, Anjali L, MD as PCP - General (Internal Medicine) Synetta ShadowLassen, Greysin Medlen C, PA-C as Physician Assistant (Internal Medicine)  Extended Emergency Contact Information Primary Emergency Contact: [facility] Address: [address]          [place], [place] [address] [place] Home Phone: [phone] Mobile Phone: [phone] Relation: Daughter Secondary Emergency Contact: Gena Frayurner,Clark  [place] Mobile Phone: [phone] Relation: Son  Code Status:  Full Code Goals of care: Advanced Directive information Advanced Directives 06/19/2017  Does Patient Have a Medical Advance Directive? Yes  Type of Advance Directive (No Data)  Does patient want to make changes to medical advance directive? No - Patient declined  Copy of Healthcare Power of Attorney in Chart? No - copy requested  Would patient like information on creating a medical advance directive? No - Patient declined     Chief Complaint  Patient presents with  . Acute Visit    F/U Pneumonia  As well as elevated temperature  HPI:  Pt is a 76 y.o. female seen today for an acute visit for follow-up of fever of unknown origin-patient was seen yesterday for a temperature of 100.4.  She also had a cough we ordered a chest x-ray which showed mild patchy predominantly basilar left lung airspace density compatible with pneumonia.  She has been started on Levaquin-as well as Atrovent nebulizers.  Today she is afebrile and appears to be stable vital signs are stable she is a poor historian secondary to dementia but she does not have any complaints and nursing staff says she has been at her baseline appetite appears to be fair which is her baseline.  They do not really report any acute issues We have done labs which appear to be baseline for  her white count is within normal range renal function appears to be stable .  This afternoon she is bright alert lying in bed appears to be at her baseline.  Regards to other medical issues she does have a history of a right femoral fracture as well as depression she is on Zoloft this appears to be relatively stable she does have a history of dementia   Past Medical History:  Diagnosis Date  . Acute encephalopathy 2012   In context of diarrhea and clinical dehydration  . Dementia    Noncompliant with Aricept as per daughter  . Protein-calorie malnutrition, severe (HCC)   . Underweight   . Vertigo    Past Surgical History:  Procedure Laterality Date  . COMPRESSION HIP SCREW Right 09/21/2015   Procedure: COMPRESSION HIP;  Surgeon: Vickki HearingStanley E Harrison, MD;  Location: [facility];  Service: Orthopedics;  Laterality: Right;    Allergies  Allergen Reactions  . Beef-Derived Products     Outpatient Encounter Medications as of 06/19/2017  Medication Sig  . acetaminophen (TYLENOL) 325 MG tablet Take 2 tablets (650 mg total) by mouth every 6 (six) hours as needed for mild pain (or Fever >/= 101).  Marland Kitchen. alendronate (FOSAMAX) 70 MG tablet Take 70 mg by mouth once a week. Take with a full glass of water on an empty stomach.  Marland Kitchen. aspirin EC 325 MG EC tablet Take 1 tablet (325 mg total) by mouth daily with breakfast.  . calcium carbonate (OS-CAL - DOSED IN MG OF ELEMENTAL CALCIUM) 1250 (500 Ca) MG tablet  Take 1 tablet by mouth 2 (two) times daily with a meal.  . Cholecalciferol 1000 units tablet Take 1,000 Units by mouth daily.  Marland Kitchen. donepezil (ARICEPT) 5 MG tablet Take 5 mg by mouth at bedtime.  Marland Kitchen. ipratropium (ATROVENT HFA) 17 MCG/ACT inhaler Inhale 1 puff into the lungs every 6 (six) hours as needed for wheezing.  . Lactobacillus Rhamnosus, GG, ([employer] PROBIOTIC, LACTOBACILLUS,) CAPS Take 1 capsule by mouth once a day  . [START ON 06/20/2017] levofloxacin (LEVAQUIN) 500 MG tablet Take 250 mg by mouth daily.    . memantine (NAMENDA) 5 MG tablet Take 5 mg by mouth 2 (two) times daily.  Marland Kitchen. senna-docusate (SENOKOT-S) 8.6-50 MG tablet Take 1 tablet by mouth 2 (two) times daily.  . sertraline (ZOLOFT) 25 MG tablet Take 25 mg by mouth daily.  Marland Kitchen. triamcinolone (KENALOG) 0.025 % cream Apply 1 application topically 2 (two) times daily.   No facility-administered encounter medications on file as of 06/19/2017.     Review of Systems   This is essentially unobtainable secondary to dementia please see HPI  Immunization History  Administered Date(s) Administered  . Influenza-Unspecified 03/20/2016, 05/03/2017  . PPD Test 06/12/2015  . Pneumococcal Conjugate-13 05/03/2017  . Pneumococcal-Unspecified 04/02/2016  . Tdap 03/04/2017   Pertinent  Health Maintenance Due  Topic Date Due  . INFLUENZA VACCINE  Completed  . DEXA SCAN  Completed  . PNA vac Low Risk Adult  Completed   Fall Risk  03/01/2017  Falls in the past year? No   Functional Status Survey:    Vitals:   06/19/17 1118  BP: 119/79  Pulse: 84  Resp: 20  Temp: 97.6 F (36.4 C)  TempSrc: Oral    Physical Exam   In general this is a somewhat frail elderly female no distress lying comfortably in bed.  Her skin is warm and dry she is not diaphoretic.  Eyes has prescription lenses visual acuity appears grossly intact.  Oropharynx is clear mucous membranes moist.  Chest is clear to auscultation with poor respiratory effort there is no labored breathing possibly some reduced breath sounds on the bases compared to other lung fields.  Heart is regular rate and rhythm without murmur gallop or rub she continues to have mild lower extremity edema.  Her abdomen soft nontender with active bowel sounds.  Musculoskeletal Limited exam since she is in bed but appears able to move all her extremities x4 at baseline with lower extremity weakness which is not new.  Neurologic other than weakness could not really appreciate any significant  findings she does not really speak much today but makes eye contact smiles and follows verbal commands.  Psych findings again compatible with significant dementia she does follow verbal commands she is pleasant cooperative smiling.    Labs reviewed: Recent Labs    09/22/16 0700 02/19/17 0250 06/19/17 0930  NA 138 138 139  K 3.9 4.0 3.5  CL 100* 103 100*  CO2 32 30 28  GLUCOSE 129* 92 103*  BUN 19 18 10   CREATININE 0.68 0.62 0.62  CALCIUM 9.5 8.7* 9.0   Recent Labs    09/22/16 0700 02/19/17 0250 06/19/17 0930  AST 24 26 43*  ALT 19 25 48  ALKPHOS 64 57 66  BILITOT 0.7 0.6 0.8  PROT 7.2 6.0* 7.6  ALBUMIN 3.9 3.4* 3.8   Recent Labs    02/19/17 0250 03/26/17 0700 06/19/17 0930  WBC 6.0 4.9 5.4  NEUTROABS 2.7 2.9 3.6  HGB 12.5 13.6 13.5  HCT 36.9 40.4 41.5  MCV 94.9 95.1 95.2  PLT 180 182 157   Lab Results  Component Value Date   TSH 2.391 02/19/2017   Lab Results  Component Value Date   HGBA1C 5.4 01/10/2016   No results found for: CHOL, HDL, LDLCALC, LDLDIRECT, TRIG, CHOLHDL  Significant Diagnostic Results in last 30 days:  No results found.  Assessment/Plan   #1-fever of unknown origin with cough and congestion- again x-ray did show a left lower lobe infiltrate suspicious for pneumonia I was notified about this via phone last night-she has been started on Levaquin and will be on this through  January 3 she also does have as needed Atrovent nebulizers ordered every 6 hours as needed as well through January 3.  Will add Mucinex 600 mg twice daily for 7 days as well.  Clinically she appears to be at baseline is no longer febrile-appears to be stable- continue to monitor as needed.     [id number] no greater than 25 minutes spent assessing patient reviewing her chart reviewing her lab Discussing her status with nursing staff in person as well as via phone-and coordinating a plan of care- of note greater than 50% of time spent coordinating plan of  care with input as noted above          [place] Sheer, [place] [phone]

## 2017-06-24 DIAGNOSIS — I739 Peripheral vascular disease, unspecified: Secondary | ICD-10-CM | POA: Diagnosis not present

## 2017-06-24 DIAGNOSIS — L603 Nail dystrophy: Secondary | ICD-10-CM | POA: Diagnosis not present

## 2017-06-24 DIAGNOSIS — R262 Difficulty in walking, not elsewhere classified: Secondary | ICD-10-CM | POA: Diagnosis not present

## 2017-06-29 ENCOUNTER — Non-Acute Institutional Stay (SKILLED_NURSING_FACILITY): Payer: Medicare Other | Admitting: Internal Medicine

## 2017-06-29 ENCOUNTER — Encounter: Payer: Self-pay | Admitting: Internal Medicine

## 2017-06-29 DIAGNOSIS — R1312 Dysphagia, oropharyngeal phase: Secondary | ICD-10-CM | POA: Diagnosis not present

## 2017-06-29 DIAGNOSIS — E559 Vitamin D deficiency, unspecified: Secondary | ICD-10-CM | POA: Diagnosis not present

## 2017-06-29 DIAGNOSIS — F339 Major depressive disorder, recurrent, unspecified: Secondary | ICD-10-CM | POA: Diagnosis not present

## 2017-06-29 DIAGNOSIS — F064 Anxiety disorder due to known physiological condition: Secondary | ICD-10-CM | POA: Diagnosis not present

## 2017-06-29 DIAGNOSIS — F0390 Unspecified dementia without behavioral disturbance: Secondary | ICD-10-CM

## 2017-06-29 DIAGNOSIS — R633 Feeding difficulties: Secondary | ICD-10-CM | POA: Diagnosis not present

## 2017-06-29 DIAGNOSIS — M810 Age-related osteoporosis without current pathological fracture: Secondary | ICD-10-CM

## 2017-06-29 DIAGNOSIS — M6281 Muscle weakness (generalized): Secondary | ICD-10-CM | POA: Diagnosis not present

## 2017-06-29 NOTE — Progress Notes (Signed)
Location:   [facility] Room Number: 154/D Place of Service:  SNF (31) Provider:  Pollyann Savoy, MD  Patient Care Team: Mahlon Gammon, MD as PCP - General (Internal Medicine) Synetta Shadow as Physician Assistant (Internal Medicine)  Extended Emergency Contact Information Primary Emergency Contact: [facility] Address: [address] [place], [place] [address] [place] Home Phone: [phone] Mobile Phone: [phone] Relation: Daughter Secondary Emergency Contact: Gena Fray [place] Mobile Phone: [phone] Relation: Son  Code Status:  Full Code Goals of care: Advanced Directive information Advanced Directives 06/29/2017  Does Patient Have a Medical Advance Directive? Yes  Type of Advance Directive (No Data)  Does patient want to make changes to medical advance directive? No - Patient declined  Copy of Healthcare Power of Attorney in Chart? No - copy requested  Would patient like information on creating a medical advance directive? No - Patient declined     Chief Complaint  Patient presents with  . Medical Management of Chronic Issues    Routine Visit  . Acute Visit    Advanced Care Planning    HPI:  Pt is a 77 y.o. female seen today for medical management of chronic diseases.   She has h/o Right Femoral Fracture due to fall, Depression and dementia., Osteoporosis Patient was seen today per request of her daughter and nurses.  Per nurses patient has had significant cognition decline.  She has also lost almost 7 pounds in the past 2 months.  She is down to 115 pounds She was recently diagnosed with pneumonia and was treated with Levaquin. She continues to have a cough.  No fever noticed. Per nurses patient is not able to eat well.  She is keeping the food in her mouth.   they also think she is probably choking when she tries to swallow too fast.  I also talked to the  daughter today and she also has noticed significant And her mom.  Past Medical History:  Diagnosis Date  . Acute encephalopathy 2012   In context of diarrhea and clinical dehydration  . Dementia    Noncompliant with Aricept as per daughter  . Protein-calorie malnutrition, severe (HCC)   . Underweight   . Vertigo    Past Surgical History:  Procedure Laterality Date  . COMPRESSION HIP SCREW Right 09/21/2015   Procedure: COMPRESSION HIP;  Surgeon: Vickki Hearing, MD;  Location: [facility];  Service: Orthopedics;  Laterality: Right;    Allergies  Allergen Reactions  . Beef-Derived Products     Outpatient Encounter Medications as of 06/29/2017  Medication Sig  . acetaminophen (TYLENOL) 325 MG tablet Take 2 tablets (650 mg total) by mouth every 6 (six) hours as needed for mild pain (or Fever >/= 101).  Marland Kitchen alendronate (FOSAMAX) 70 MG tablet Take 70 mg by mouth once a week. Take with a full glass of water on an empty stomach.  Marland Kitchen aspirin EC 325 MG EC tablet Take 1 tablet (325 mg total) by mouth daily with breakfast.  . calcium carbonate (OS-CAL - DOSED IN MG OF ELEMENTAL CALCIUM) 1250 (500 Ca) MG tablet Take 1 tablet by mouth 2 (two) times daily with a meal.  . Cholecalciferol 1000 units tablet Take 1,000 Units by mouth daily.  Marland Kitchen donepezil (ARICEPT) 5 MG tablet Take 5 mg by mouth at bedtime.  . memantine (NAMENDA) 5 MG tablet Take 5  mg by mouth 2 (two) times daily.  Marland Kitchen. senna-docusate (SENOKOT-S) 8.6-50 MG tablet Take 1 tablet by mouth 2 (two) times daily.  . sertraline (ZOLOFT) 25 MG tablet Take 25 mg by mouth daily.  Marland Kitchen. triamcinolone (KENALOG) 0.025 % cream Apply 1 application topically 2 (two) times daily.  . [DISCONTINUED] ipratropium (ATROVENT HFA) 17 MCG/ACT inhaler Inhale 1 puff into the lungs every 6 (six) hours as needed for wheezing.  . [DISCONTINUED] Lactobacillus Rhamnosus, GG, ([employer] PROBIOTIC, LACTOBACILLUS,) CAPS Take 1 capsule by mouth once a day   No facility-administered  encounter medications on file as of 06/29/2017.      Review of Systems  Unable to perform ROS: Dementia    Immunization History  Administered Date(s) Administered  . Influenza-Unspecified 03/20/2016, 05/03/2017  . PPD Test 06/12/2015  . Pneumococcal Conjugate-13 05/03/2017  . Pneumococcal-Unspecified 04/02/2016  . Tdap 03/04/2017   Pertinent  Health Maintenance Due  Topic Date Due  . INFLUENZA VACCINE  Completed  . DEXA SCAN  Completed  . PNA vac Low Risk Adult  Completed   Fall Risk  03/01/2017  Falls in the past year? No   Functional Status Survey:    Vitals:   06/29/17 1539  BP: 128/63  Pulse: 73  Resp: (!) 22  Temp: 98.4 F (36.9 C)   There is no height or weight on file to calculate BMI. Physical Exam  Constitutional: She appears well-developed.  HENT:  Head: Normocephalic.  Mouth/Throat: Oropharynx is clear and moist.  Eyes: Pupils are equal, round, and reactive to light.  Neck: Neck supple.  Cardiovascular: Normal rate and normal heart sounds.  Pulmonary/Chest: Effort normal.  Had Rales in left Lower base  Abdominal: Soft. Bowel sounds are normal. She exhibits no distension. There is no tenderness. There is no rebound.  Musculoskeletal: She exhibits no edema.  Lymphadenopathy:    She has no cervical adenopathy.  Neurological: She is alert.  Patient was not following my Commands today    Labs reviewed: Recent Labs    09/22/16 0700 02/19/17 0250 06/19/17 0930  NA 138 138 139  K 3.9 4.0 3.5  CL 100* 103 100*  CO2 32 30 28  GLUCOSE 129* 92 103*  BUN 19 18 10   CREATININE 0.68 0.62 0.62  CALCIUM 9.5 8.7* 9.0   Recent Labs    09/22/16 0700 02/19/17 0250 06/19/17 0930  AST 24 26 43*  ALT 19 25 48  ALKPHOS 64 57 66  BILITOT 0.7 0.6 0.8  PROT 7.2 6.0* 7.6  ALBUMIN 3.9 3.4* 3.8   Recent Labs    02/19/17 0250 03/26/17 0700 06/19/17 0930  WBC 6.0 4.9 5.4  NEUTROABS 2.7 2.9 3.6  HGB 12.5 13.6 13.5  HCT 36.9 40.4 41.5  MCV 94.9 95.1 95.2   PLT 180 182 157   Lab Results  Component Value Date   TSH 2.391 02/19/2017   Lab Results  Component Value Date   HGBA1C 5.4 01/10/2016   No results found for: CHOL, HDL, LDLCALC, LDLDIRECT, TRIG, CHOLHDL  Significant Diagnostic Results in last 30 days:  No results found.  Assessment/Plan Dementia Patient has had significant change in the past few months.  Discussed with the nurses therapy  and her daughter today.  Her daughter is agreed to make her DNR .  She also does not want tube feeds. Patient had MRI done in 2016 which showed cerebral atrophy with chronic vascular changes.  I discussed with the daughter and she does not want anything aggressive.  We will continue her on aspirin.  Patient  is also on Aricept and Namenda. Failure to thrive with weight loss Discussed with speech.  According to them patient is losing interest in food.  She does not know what to do and fills her mouth with a lot of food and chokes on it.  They have recommended dysphagia diet under supervision. Recent pneumonia Patient was treated with Levaquin. She continues to be afebrile but that some her but has some rales on exam.  Osteoporosis Patient DEXA imaging shows T Score of -2.88 Patient On Fosamax and seem to be tolerating.  Vitamin D deficiency On Supplement  Depression,  On Zoloft    Family/ staff Communication:   Labs/tests ordered:

## 2017-06-30 DIAGNOSIS — R633 Feeding difficulties: Secondary | ICD-10-CM | POA: Diagnosis not present

## 2017-06-30 DIAGNOSIS — F064 Anxiety disorder due to known physiological condition: Secondary | ICD-10-CM | POA: Diagnosis not present

## 2017-06-30 DIAGNOSIS — F0390 Unspecified dementia without behavioral disturbance: Secondary | ICD-10-CM | POA: Diagnosis not present

## 2017-06-30 DIAGNOSIS — M6281 Muscle weakness (generalized): Secondary | ICD-10-CM | POA: Diagnosis not present

## 2017-06-30 DIAGNOSIS — R1312 Dysphagia, oropharyngeal phase: Secondary | ICD-10-CM | POA: Diagnosis not present

## 2017-07-01 DIAGNOSIS — F0390 Unspecified dementia without behavioral disturbance: Secondary | ICD-10-CM | POA: Diagnosis not present

## 2017-07-01 DIAGNOSIS — R1312 Dysphagia, oropharyngeal phase: Secondary | ICD-10-CM | POA: Diagnosis not present

## 2017-07-01 DIAGNOSIS — F064 Anxiety disorder due to known physiological condition: Secondary | ICD-10-CM | POA: Diagnosis not present

## 2017-07-01 DIAGNOSIS — M6281 Muscle weakness (generalized): Secondary | ICD-10-CM | POA: Diagnosis not present

## 2017-07-01 DIAGNOSIS — R633 Feeding difficulties: Secondary | ICD-10-CM | POA: Diagnosis not present

## 2017-07-02 DIAGNOSIS — F0390 Unspecified dementia without behavioral disturbance: Secondary | ICD-10-CM | POA: Diagnosis not present

## 2017-07-02 DIAGNOSIS — M6281 Muscle weakness (generalized): Secondary | ICD-10-CM | POA: Diagnosis not present

## 2017-07-02 DIAGNOSIS — R1312 Dysphagia, oropharyngeal phase: Secondary | ICD-10-CM | POA: Diagnosis not present

## 2017-07-02 DIAGNOSIS — F064 Anxiety disorder due to known physiological condition: Secondary | ICD-10-CM | POA: Diagnosis not present

## 2017-07-02 DIAGNOSIS — R633 Feeding difficulties: Secondary | ICD-10-CM | POA: Diagnosis not present

## 2017-07-05 DIAGNOSIS — R1312 Dysphagia, oropharyngeal phase: Secondary | ICD-10-CM | POA: Diagnosis not present

## 2017-07-05 DIAGNOSIS — F0390 Unspecified dementia without behavioral disturbance: Secondary | ICD-10-CM | POA: Diagnosis not present

## 2017-07-05 DIAGNOSIS — F064 Anxiety disorder due to known physiological condition: Secondary | ICD-10-CM | POA: Diagnosis not present

## 2017-07-05 DIAGNOSIS — R633 Feeding difficulties: Secondary | ICD-10-CM | POA: Diagnosis not present

## 2017-07-05 DIAGNOSIS — M6281 Muscle weakness (generalized): Secondary | ICD-10-CM | POA: Diagnosis not present

## 2017-07-06 DIAGNOSIS — M6281 Muscle weakness (generalized): Secondary | ICD-10-CM | POA: Diagnosis not present

## 2017-07-06 DIAGNOSIS — F064 Anxiety disorder due to known physiological condition: Secondary | ICD-10-CM | POA: Diagnosis not present

## 2017-07-06 DIAGNOSIS — R1312 Dysphagia, oropharyngeal phase: Secondary | ICD-10-CM | POA: Diagnosis not present

## 2017-07-06 DIAGNOSIS — R633 Feeding difficulties: Secondary | ICD-10-CM | POA: Diagnosis not present

## 2017-07-06 DIAGNOSIS — F0390 Unspecified dementia without behavioral disturbance: Secondary | ICD-10-CM | POA: Diagnosis not present

## 2017-07-07 DIAGNOSIS — R1312 Dysphagia, oropharyngeal phase: Secondary | ICD-10-CM | POA: Diagnosis not present

## 2017-07-07 DIAGNOSIS — M6281 Muscle weakness (generalized): Secondary | ICD-10-CM | POA: Diagnosis not present

## 2017-07-07 DIAGNOSIS — F064 Anxiety disorder due to known physiological condition: Secondary | ICD-10-CM | POA: Diagnosis not present

## 2017-07-07 DIAGNOSIS — F0390 Unspecified dementia without behavioral disturbance: Secondary | ICD-10-CM | POA: Diagnosis not present

## 2017-07-07 DIAGNOSIS — R633 Feeding difficulties: Secondary | ICD-10-CM | POA: Diagnosis not present

## 2017-07-08 DIAGNOSIS — F0390 Unspecified dementia without behavioral disturbance: Secondary | ICD-10-CM | POA: Diagnosis not present

## 2017-07-08 DIAGNOSIS — R633 Feeding difficulties: Secondary | ICD-10-CM | POA: Diagnosis not present

## 2017-07-08 DIAGNOSIS — R1312 Dysphagia, oropharyngeal phase: Secondary | ICD-10-CM | POA: Diagnosis not present

## 2017-07-08 DIAGNOSIS — F064 Anxiety disorder due to known physiological condition: Secondary | ICD-10-CM | POA: Diagnosis not present

## 2017-07-08 DIAGNOSIS — M6281 Muscle weakness (generalized): Secondary | ICD-10-CM | POA: Diagnosis not present

## 2017-07-09 DIAGNOSIS — R633 Feeding difficulties: Secondary | ICD-10-CM | POA: Diagnosis not present

## 2017-07-09 DIAGNOSIS — F0390 Unspecified dementia without behavioral disturbance: Secondary | ICD-10-CM | POA: Diagnosis not present

## 2017-07-09 DIAGNOSIS — M6281 Muscle weakness (generalized): Secondary | ICD-10-CM | POA: Diagnosis not present

## 2017-07-09 DIAGNOSIS — F064 Anxiety disorder due to known physiological condition: Secondary | ICD-10-CM | POA: Diagnosis not present

## 2017-07-09 DIAGNOSIS — R1312 Dysphagia, oropharyngeal phase: Secondary | ICD-10-CM | POA: Diagnosis not present

## 2017-07-12 DIAGNOSIS — R1312 Dysphagia, oropharyngeal phase: Secondary | ICD-10-CM | POA: Diagnosis not present

## 2017-07-12 DIAGNOSIS — F064 Anxiety disorder due to known physiological condition: Secondary | ICD-10-CM | POA: Diagnosis not present

## 2017-07-12 DIAGNOSIS — M6281 Muscle weakness (generalized): Secondary | ICD-10-CM | POA: Diagnosis not present

## 2017-07-12 DIAGNOSIS — R633 Feeding difficulties: Secondary | ICD-10-CM | POA: Diagnosis not present

## 2017-07-12 DIAGNOSIS — F0390 Unspecified dementia without behavioral disturbance: Secondary | ICD-10-CM | POA: Diagnosis not present

## 2017-07-13 DIAGNOSIS — F064 Anxiety disorder due to known physiological condition: Secondary | ICD-10-CM | POA: Diagnosis not present

## 2017-07-13 DIAGNOSIS — R633 Feeding difficulties: Secondary | ICD-10-CM | POA: Diagnosis not present

## 2017-07-13 DIAGNOSIS — M6281 Muscle weakness (generalized): Secondary | ICD-10-CM | POA: Diagnosis not present

## 2017-07-13 DIAGNOSIS — R1312 Dysphagia, oropharyngeal phase: Secondary | ICD-10-CM | POA: Diagnosis not present

## 2017-07-13 DIAGNOSIS — F0390 Unspecified dementia without behavioral disturbance: Secondary | ICD-10-CM | POA: Diagnosis not present

## 2017-07-14 DIAGNOSIS — M6281 Muscle weakness (generalized): Secondary | ICD-10-CM | POA: Diagnosis not present

## 2017-07-14 DIAGNOSIS — R1312 Dysphagia, oropharyngeal phase: Secondary | ICD-10-CM | POA: Diagnosis not present

## 2017-07-14 DIAGNOSIS — F0390 Unspecified dementia without behavioral disturbance: Secondary | ICD-10-CM | POA: Diagnosis not present

## 2017-07-14 DIAGNOSIS — F064 Anxiety disorder due to known physiological condition: Secondary | ICD-10-CM | POA: Diagnosis not present

## 2017-07-14 DIAGNOSIS — R633 Feeding difficulties: Secondary | ICD-10-CM | POA: Diagnosis not present

## 2017-07-15 DIAGNOSIS — R1312 Dysphagia, oropharyngeal phase: Secondary | ICD-10-CM | POA: Diagnosis not present

## 2017-07-15 DIAGNOSIS — M6281 Muscle weakness (generalized): Secondary | ICD-10-CM | POA: Diagnosis not present

## 2017-07-15 DIAGNOSIS — F0390 Unspecified dementia without behavioral disturbance: Secondary | ICD-10-CM | POA: Diagnosis not present

## 2017-07-15 DIAGNOSIS — F064 Anxiety disorder due to known physiological condition: Secondary | ICD-10-CM | POA: Diagnosis not present

## 2017-07-15 DIAGNOSIS — R633 Feeding difficulties: Secondary | ICD-10-CM | POA: Diagnosis not present

## 2017-07-16 DIAGNOSIS — R633 Feeding difficulties: Secondary | ICD-10-CM | POA: Diagnosis not present

## 2017-07-16 DIAGNOSIS — M6281 Muscle weakness (generalized): Secondary | ICD-10-CM | POA: Diagnosis not present

## 2017-07-16 DIAGNOSIS — F0390 Unspecified dementia without behavioral disturbance: Secondary | ICD-10-CM | POA: Diagnosis not present

## 2017-07-16 DIAGNOSIS — F064 Anxiety disorder due to known physiological condition: Secondary | ICD-10-CM | POA: Diagnosis not present

## 2017-07-16 DIAGNOSIS — R1312 Dysphagia, oropharyngeal phase: Secondary | ICD-10-CM | POA: Diagnosis not present

## 2017-07-19 DIAGNOSIS — M6281 Muscle weakness (generalized): Secondary | ICD-10-CM | POA: Diagnosis not present

## 2017-07-19 DIAGNOSIS — R1312 Dysphagia, oropharyngeal phase: Secondary | ICD-10-CM | POA: Diagnosis not present

## 2017-07-19 DIAGNOSIS — R633 Feeding difficulties: Secondary | ICD-10-CM | POA: Diagnosis not present

## 2017-07-19 DIAGNOSIS — F0390 Unspecified dementia without behavioral disturbance: Secondary | ICD-10-CM | POA: Diagnosis not present

## 2017-07-19 DIAGNOSIS — F064 Anxiety disorder due to known physiological condition: Secondary | ICD-10-CM | POA: Diagnosis not present

## 2017-07-21 ENCOUNTER — Encounter: Payer: Self-pay | Admitting: Internal Medicine

## 2017-07-21 ENCOUNTER — Non-Acute Institutional Stay (SKILLED_NURSING_FACILITY): Payer: Medicare Other | Admitting: Internal Medicine

## 2017-07-21 DIAGNOSIS — K1379 Other lesions of oral mucosa: Secondary | ICD-10-CM

## 2017-07-21 DIAGNOSIS — R633 Feeding difficulties: Secondary | ICD-10-CM | POA: Diagnosis not present

## 2017-07-21 DIAGNOSIS — R627 Adult failure to thrive: Secondary | ICD-10-CM | POA: Diagnosis not present

## 2017-07-21 DIAGNOSIS — F064 Anxiety disorder due to known physiological condition: Secondary | ICD-10-CM | POA: Diagnosis not present

## 2017-07-21 DIAGNOSIS — R1312 Dysphagia, oropharyngeal phase: Secondary | ICD-10-CM | POA: Diagnosis not present

## 2017-07-21 DIAGNOSIS — F0390 Unspecified dementia without behavioral disturbance: Secondary | ICD-10-CM | POA: Diagnosis not present

## 2017-07-21 DIAGNOSIS — M810 Age-related osteoporosis without current pathological fracture: Secondary | ICD-10-CM

## 2017-07-21 DIAGNOSIS — M6281 Muscle weakness (generalized): Secondary | ICD-10-CM | POA: Diagnosis not present

## 2017-07-21 NOTE — Progress Notes (Signed)
Location:   [facility] Room Number: 154/D Place of Service:  SNF [phone]) Provider:  Sabino Dick, MD  Patient Care Team: Mahlon Gammon, MD as PCP - General (Internal Medicine) Synetta Shadow as Physician Assistant (Internal Medicine)  Extended Emergency Contact Information Primary Emergency Contact: [facility] Address: [address] [place], [place] [address] [place] Home Phone: [phone] Mobile Phone: [phone] Relation: Daughter Secondary Emergency Contact: Gena Fray [place] Mobile Phone: [phone] Relation: Son  Code Status:  DNR Goals of care: Advanced Directive information Advanced Directives 07/21/2017  Does Patient Have a Medical Advance Directive? Yes  Type of Advance Directive (No Data)  Does patient want to make changes to medical advance directive? No - Patient declined  Copy of Healthcare Power of Attorney in Chart? No - copy requested  Would patient like information on creating a medical advance directive? No - Patient declined     Chief complaint routine visit for medical management of chronic medical conditions including dementia- failure to thrive- osteoporosis- vitamin D deficiency--acute visit secondary to lower lip sores  HPI:  Pt is a 77 y.o. female seen today for an acute visit for lower lip sores as well as medical management of chronic medical conditions as noted above.  She was seen on January 8 by Dr. Chales Abrahams for advanced planning concerns.  Patient had significant dementia and per discussion between Dr.Guptaand family--she was made a DNR with no desire for tube feedings.  Patient did have an MRI done in 2016 showed cerebral atrophy with chronic vascular changes this was discussed with her daughter and daughter did not wish any aggressive treatment.  She has been continued on aspirin she is also on Aricept and Namenda.  She does have  significant failure to thrive and does not really have much interest in food and oftentimes will feel her mouth with food and not really swallowing.  Speech has recommended a dysphagia diet under supervision.  Last weight was 115 pounds earlier this month.  Regards to pneumonia she was treated with Levaquin several weeks ago this appears to be stabilized although she remains at risk with her rather debilitated status lung exam did not really appreciating any congestion this evening.  Most acute issue this evening is apparently some mouth sores developed especially on her lower lip.  I am following up on this.     Past Medical History:  Diagnosis Date  . Acute encephalopathy 2012   In context of diarrhea and clinical dehydration  . Dementia    Noncompliant with Aricept as per daughter  . Protein-calorie malnutrition, severe (HCC)   . Underweight   . Vertigo    Past Surgical History:  Procedure Laterality Date  . COMPRESSION HIP SCREW Right 09/21/2015   Procedure: COMPRESSION HIP;  Surgeon: Vickki Hearing, MD;  Location: [facility];  Service: Orthopedics;  Laterality: Right;    Allergies  Allergen Reactions  . Beef-Derived Products     Outpatient Encounter Medications as of 07/21/2017  Medication Sig  . acetaminophen (TYLENOL) 325 MG tablet Take 2 tablets (650 mg total) by mouth every 6 (six) hours as needed for mild pain (or Fever >/= 101).  Marland Kitchen alendronate (FOSAMAX) 70 MG tablet Take 70 mg by mouth once a week. Take with a full glass of water on an empty stomach.  Marland Kitchen aspirin EC 325 MG EC tablet Take 1 tablet (  325 mg total) by mouth daily with breakfast.  . calcium carbonate (OS-CAL - DOSED IN MG OF ELEMENTAL CALCIUM) 1250 (500 Ca) MG tablet Take 1 tablet by mouth 2 (two) times daily with a meal.  . Cholecalciferol 1000 units tablet Take 1,000 Units by mouth daily.  Marland Kitchen. donepezil (ARICEPT) 5 MG tablet Take 5 mg by mouth at bedtime.  . memantine (NAMENDA) 5 MG tablet Take 5 mg by  mouth 2 (two) times daily.  Marland Kitchen. senna-docusate (SENOKOT-S) 8.6-50 MG tablet Take 1 tablet by mouth 2 (two) times daily.  . sertraline (ZOLOFT) 25 MG tablet Take 25 mg by mouth daily.  Marland Kitchen. triamcinolone (KENALOG) 0.025 % cream Apply 1 application topically 2 (two) times daily.   No facility-administered encounter medications on file as of 07/21/2017.     Review of Systems essentially unobtainable secondary to dementia please see HPI     Immunization History  Administered Date(s) Administered  . Influenza-Unspecified 03/20/2016, 05/03/2017  . PPD Test 06/12/2015  . Pneumococcal Conjugate-13 05/03/2017  . Pneumococcal-Unspecified 04/02/2016  . Tdap 03/04/2017   Pertinent  Health Maintenance Due  Topic Date Due  . INFLUENZA VACCINE  Completed  . DEXA SCAN  Completed  . PNA vac Low Risk Adult  Completed   Fall Risk  03/01/2017  Falls in the past year? No   Functional Status Survey:    Temperature is pending pulse is 68 respirations of 18 blood pressure taken manually 150/90 as he previous blood pressures 112/64-117/65-147/63 most recent weight is 115.2 earlier this month  Physical Exam   In general this is a frail elderly female in no distress lying comfortably in bed.  Her skin is warm and dry.  Eyes pupils appear reactive to light sclera and conjunctive are clear she has prescription lenses.  Oropharynx appears she is just spit out some chocolate candy-she does have somewhat of a irritated erythematous scaly presentation to her lower lips- there is no active bleeding or discharge.  Chest is clear to auscultation with poor respiratory effort there is shallow air entry there is no labored breathing.  Heart is regular rate and rhythm without murmur gallop or rub.  Abdomen is soft does not appear to be tender there are positive bowel sounds.  Musculoskeletal has general frailty but is able to move all extremities she does not have significant lower extremity edema.  Neurologic  she is alert could not really appreciate any true lateralizing findings she does make eye contact but does not really speaking.  Labs.  June 19, 2017.  WBC was 5.4 hemoglobin 13.5 platelets 157.  Sodium 139 potassium 3.5 BUN 10 creatinine 0.62.  Albumin was actually 3.8 AST was minimally elevated at 43.  Assessment and plan.  1.  #1 advanced dementia she continues to have some decline in function appetite and weight loss which I suspect will be a difficult challenge she has been made DNR and family does not wish to feedings-per discussion with Dr. Chales AbrahamsGupta by family daughter does not want to be real aggressive here MOST form has been filled out.  She continues with supportive care she is on Aricept and Namenda she also is on aspirin.  2.  Failure to thrive again this will continue to be an issue she is on a dysphagia diet and appears she still struggles at times with eating and feels her mouth with food does not really swallow it.  She is followed by speech therapy with recommendation for dysphagia diet with supervision-will update a week  to see where we stand.  3.  History of pneumonia she remains at risk at this point appears to be stable no longer able.  4.  Osteoporosis she continues on Fosamax as well as calcium and appears to be tolerating these medications.  5.  History of vitamin D deficiency she is on supplementation vitamin D level was 42.6 on lab done last August.  6.  History of mouth sores-irritation-initially will try to swallow with Magic mouthwash 4 times a day to see if this helps if this persists or worsens certainly will readdress--at this point does not really give a herpes presentation but will monitor   [id number]   Labs reviewed: Recent Labs    09/22/16 0700 02/19/17 0250 06/19/17 0930  NA 138 138 139  K 3.9 4.0 3.5  CL 100* 103 100*  CO2 32 30 28  GLUCOSE 129* 92 103*  BUN 19 18 10   CREATININE 0.68 0.62 0.62  CALCIUM 9.5 8.7* 9.0   Recent Labs      09/22/16 0700 02/19/17 0250 06/19/17 0930  AST 24 26 43*  ALT 19 25 48  ALKPHOS 64 57 66  BILITOT 0.7 0.6 0.8  PROT 7.2 6.0* 7.6  ALBUMIN 3.9 3.4* 3.8   Recent Labs    02/19/17 0250 03/26/17 0700 06/19/17 0930  WBC 6.0 4.9 5.4  NEUTROABS 2.7 2.9 3.6  HGB 12.5 13.6 13.5  HCT 36.9 40.4 41.5  MCV 94.9 95.1 95.2  PLT 180 182 157   Lab Results  Component Value Date   TSH 2.391 02/19/2017   Lab Results  Component Value Date   HGBA1C 5.4 01/10/2016   No results found for: CHOL, HDL, LDLCALC, LDLDIRECT, TRIG, CHOLHDL   Significant Diagnostic Results in last 30 days:  No results found.

## 2017-07-23 ENCOUNTER — Non-Acute Institutional Stay (SKILLED_NURSING_FACILITY): Payer: Medicare Other | Admitting: Internal Medicine

## 2017-07-23 ENCOUNTER — Encounter: Payer: Self-pay | Admitting: Internal Medicine

## 2017-07-23 DIAGNOSIS — K1379 Other lesions of oral mucosa: Secondary | ICD-10-CM

## 2017-07-23 DIAGNOSIS — R627 Adult failure to thrive: Secondary | ICD-10-CM

## 2017-07-23 DIAGNOSIS — B373 Candidiasis of vulva and vagina: Secondary | ICD-10-CM

## 2017-07-23 DIAGNOSIS — R1312 Dysphagia, oropharyngeal phase: Secondary | ICD-10-CM | POA: Diagnosis not present

## 2017-07-23 DIAGNOSIS — M6281 Muscle weakness (generalized): Secondary | ICD-10-CM | POA: Diagnosis not present

## 2017-07-23 DIAGNOSIS — R633 Feeding difficulties: Secondary | ICD-10-CM | POA: Diagnosis not present

## 2017-07-23 DIAGNOSIS — F0390 Unspecified dementia without behavioral disturbance: Secondary | ICD-10-CM | POA: Diagnosis not present

## 2017-07-23 DIAGNOSIS — F064 Anxiety disorder due to known physiological condition: Secondary | ICD-10-CM | POA: Diagnosis not present

## 2017-07-23 DIAGNOSIS — B3731 Acute candidiasis of vulva and vagina: Secondary | ICD-10-CM

## 2017-07-23 NOTE — Progress Notes (Signed)
Location:   [facility] Room Number: 154/D Place of Service:  SNF [phone]) Provider:  Sabino Dick, MD  Patient Care Team: Mahlon Gammon, MD as PCP - General (Internal Medicine) Synetta Shadow as Physician Assistant (Internal Medicine)  Extended Emergency Contact Information Primary Emergency Contact: [facility] Address: [address] [place], [place] [address] [place] of [place] Home Phone: [phone] Mobile Phone: [phone] Relation: Daughter Secondary Emergency Contact: Gena Fray [place] of [place] Mobile Phone: [phone] Relation: Son  Code Status:  DNR Goals of care: Advanced Directive information Advanced Directives 07/21/2017  Does Patient Have a Medical Advance Directive? Yes  Type of Advance Directive (No Data)  Does patient want to make changes to medical advance directive? No - Patient declined  Copy of Healthcare Power of Attorney in Chart? No - copy requested  Would patient like information on creating a medical advance directive? No - Patient declined     Chief complaint acute visit follow-up possible yeast infection  HPI:  Pt is a 77 y.o. female seen today for an acute visit for follow-up of a possible vaginal yeast infection.  Patient has a history of severe dementia progressing as well as failure to thrive osteoporosis vitamin D deficiency.  She also was seen for some lip sores and we have prescribed Magic mouthwash and per nursing staff this is improving.  She recently had been treated for pneumonia as well is now off an antibiotic but nursing staff has noted some erythema suspicious for yeast in her vaginal area.  Patient is a poor historian and cannot really give any review of systems.  Vital signs appear to be stable she is afebrile   Past Medical History:  Diagnosis Date  . Acute encephalopathy 2012   In context of diarrhea and clinical dehydration  . Dementia     Noncompliant with Aricept as per daughter  . Protein-calorie malnutrition, severe (HCC)   . Underweight   . Vertigo    Past Surgical History:  Procedure Laterality Date  . COMPRESSION HIP SCREW Right 09/21/2015   Procedure: COMPRESSION HIP;  Surgeon: Vickki Hearing, MD;  Location: [facility];  Service: Orthopedics;  Laterality: Right;    Allergies  Allergen Reactions  . Beef-Derived Products     Outpatient Encounter Medications as of 07/23/2017  Medication Sig  . acetaminophen (TYLENOL) 325 MG tablet Take 2 tablets (650 mg total) by mouth every 6 (six) hours as needed for mild pain (or Fever >/= 101).  Marland Kitchen alendronate (FOSAMAX) 70 MG tablet Take 70 mg by mouth once a week. Take with a full glass of water on an empty stomach.  Marland Kitchen aspirin EC 325 MG EC tablet Take 1 tablet (325 mg total) by mouth daily with breakfast.  . calcium carbonate (OS-CAL - DOSED IN MG OF ELEMENTAL CALCIUM) 1250 (500 Ca) MG tablet Take 1 tablet by mouth 2 (two) times daily with a meal.  . Cholecalciferol 1000 units tablet Take 1,000 Units by mouth daily.  Marland Kitchen donepezil (ARICEPT) 5 MG tablet Take 5 mg by mouth at bedtime.  . magic mouthwash SOLN Take 5 mLs by mouth 4 (four) times daily.  . memantine (NAMENDA) 5 MG tablet Take 5 mg by mouth 2 (two) times daily.  Marland Kitchen senna-docusate (SENOKOT-S) 8.6-50 MG tablet Take 1 tablet by mouth 2 (two) times daily.  . sertraline (ZOLOFT) 25 MG tablet Take 25 mg by mouth  daily.  . triamcinolone (KENALOG) 0.025 % cream Apply 1 application topically 2 (two) times daily.   No facility-administered encounter medications on file as of 07/23/2017.     Review of Systems   Essentially unobtainable secondary to dementia please see HPI  Immunization History  Administered Date(s) Administered  . Influenza-Unspecified 03/20/2016, 05/03/2017  . PPD Test 06/12/2015  . Pneumococcal Conjugate-13 05/03/2017  . Pneumococcal-Unspecified 04/02/2016  . Tdap 03/04/2017   Pertinent  Health  Maintenance Due  Topic Date Due  . INFLUENZA VACCINE  Completed  . DEXA SCAN  Completed  . PNA vac Low Risk Adult  Completed   Fall Risk  03/01/2017  Falls in the past year? No   Functional Status Survey:    Vitals:   07/23/17 1221  BP: (!) 150/88  Pulse: 78  Resp: 16  Temp: 98 F (36.7 C)  TempSrc: Oral  Weight: 112 lb 12.8 oz (51.2 kg)  Of note manual blood pressure was 144/84 Body mass index is 20.63 kg/m. Physical Exam In general this is a frail elderly female in no distress lying comfortably in bed.  Her skin is warm and dry skin turgor does not appear to be grossly impaired.  Eyes pupils appear to be reactive to light sclera conjunctive are clear visual acuity appears grossly intact she does make eye contact.  Oropharynx is clear mucous membranes moist.  Her lips continue to have somewhat of an irritated presentation but appears to be improved from exam a couple days ago but there is still some erythema but this does not look as inflamed or irritated as previously.  Chest is clear with poor respiratory effort there is no labored breathing.  Heart is regular rate and rhythm without murmur gallop or rub.  Her abdomen is soft nontender with positive bowel sounds.  GU-she does have an erythematous rash lateral to her vaginal area bilaterally possibly small amount of drainage here whitish although this is difficult to assess it is quite scant  Musculoskeletal continues with general frailty does move all extremities x4 he does not really have significant lower extremity edema.  Neurologic she is alert she makes eye contact appears able to move all her extremities this is baseline with previous exam Labs reviewed: Recent Labs    09/22/16 0700 02/19/17 0250 06/19/17 0930  NA 138 138 139  K 3.9 4.0 3.5  CL 100* 103 100*  CO2 32 30 28  GLUCOSE 129* 92 103*  BUN 19 18 10   CREATININE 0.68 0.62 0.62  CALCIUM 9.5 8.7* 9.0   Recent Labs    09/22/16 0700  02/19/17 0250 06/19/17 0930  AST 24 26 43*  ALT 19 25 48  ALKPHOS 64 57 66  BILITOT 0.7 0.6 0.8  PROT 7.2 6.0* 7.6  ALBUMIN 3.9 3.4* 3.8   Recent Labs    02/19/17 0250 03/26/17 0700 06/19/17 0930  WBC 6.0 4.9 5.4  NEUTROABS 2.7 2.9 3.6  HGB 12.5 13.6 13.5  HCT 36.9 40.4 41.5  MCV 94.9 95.1 95.2  PLT 180 182 157   Lab Results  Component Value Date   TSH 2.391 02/19/2017   Lab Results  Component Value Date   HGBA1C 5.4 01/10/2016   No results found for: CHOL, HDL, LDLCALC, LDLDIRECT, TRIG, CHOLHDL  Significant Diagnostic Results in last 30 days:  No results found.  Assessment/Plan  #1 possible yeast infection would be at risk with recent antibiotic for pneumonia-will treat with Diflucan 100 mg daily for 3 days and monitor.  2.  Lip issues again this appears to be improving somewhat we will extend the course of Magic mouth wash to be applied with swab.  3.  History of a progressive dementia with failure to thrive vital signs are stable she has lost a small amount of weight since beginning of the month-will update labs including a CBC with differential and a CMP to see where we stand with electrolytes and renal function she does not appear to be in any distress and I suspect with her progressing just dementia this will continue to be a challenge.--This was discussed with Dr. Chales Abrahams via phone  Continue  supportive care.  [id number]

## 2017-07-24 ENCOUNTER — Encounter (HOSPITAL_COMMUNITY)
Admission: RE | Admit: 2017-07-24 | Discharge: 2017-07-24 | Disposition: A | Discharge status: Home / Self Care | Payer: Medicare Other | Source: Skilled Nursing Facility | Attending: Internal Medicine | Admitting: Internal Medicine

## 2017-07-24 DIAGNOSIS — E559 Vitamin D deficiency, unspecified: Secondary | ICD-10-CM | POA: Diagnosis not present

## 2017-07-24 LAB — CBC WITH DIFFERENTIAL/PLATELET
Basophils Absolute: 0 10*3/uL (ref 0.0–0.1)
Basophils Relative: 0 %
Eosinophils Absolute: 0.2 10*3/uL (ref 0.0–0.7)
Eosinophils Relative: 3 %
HCT: 47.3 % — ABNORMAL HIGH (ref 36.0–46.0)
HEMOGLOBIN: 14.9 g/dL (ref 12.0–15.0)
Lymphocytes Relative: 16 %
Lymphs Abs: 1.3 10*3/uL (ref 0.7–4.0)
MCH: 31.1 pg (ref 26.0–34.0)
MCHC: 31.5 g/dL (ref 30.0–36.0)
MCV: 98.7 fL (ref 78.0–100.0)
Monocytes Absolute: 0.5 10*3/uL (ref 0.1–1.0)
Monocytes Relative: 6 %
NEUTROS ABS: 6.1 10*3/uL (ref 1.7–7.7)
Neutrophils Relative %: 75 %
Platelets: 174 10*3/uL (ref 150–400)
RBC: 4.79 MIL/uL (ref 3.87–5.11)
RDW: 12.5 % (ref 11.5–15.5)
WBC: 8.1 10*3/uL (ref 4.0–10.5)

## 2017-07-24 LAB — COMPREHENSIVE METABOLIC PANEL
ALK PHOS: 90 U/L (ref 38–126)
ALT: 25 U/L (ref 14–54)
AST: 26 U/L (ref 15–41)
Albumin: 3.9 g/dL (ref 3.5–5.0)
Anion gap: 11 (ref 5–15)
BILIRUBIN TOTAL: 0.8 mg/dL (ref 0.3–1.2)
BUN: 17 mg/dL (ref 6–20)
CO2: 30 mmol/L (ref 22–32)
CREATININE: 0.58 mg/dL (ref 0.44–1.00)
Calcium: 9.4 mg/dL (ref 8.9–10.3)
Chloride: 107 mmol/L (ref 101–111)
GFR calc Af Amer: >60 mL/min (ref >60)
GFR calc non Af Amer: >60 mL/min (ref >60)
GLUCOSE: 123 mg/dL — AB (ref 65–99)
POTASSIUM: 3.9 mmol/L (ref 3.5–5.1)
Sodium: 148 mmol/L — ABNORMAL HIGH (ref 135–145)
TOTAL PROTEIN: 7.1 g/dL (ref 6.5–8.1)

## 2017-07-25 ENCOUNTER — Encounter (HOSPITAL_COMMUNITY)
Admission: AD | Admit: 2017-07-25 | Discharge: 2017-07-25 | Disposition: A | Discharge status: Home / Self Care | Payer: Medicare Other | Source: Skilled Nursing Facility

## 2017-07-25 DIAGNOSIS — E559 Vitamin D deficiency, unspecified: Secondary | ICD-10-CM | POA: Diagnosis not present

## 2017-07-25 LAB — CBC WITH DIFFERENTIAL/PLATELET
BASOS PCT: 1 %
Basophils Absolute: 0 10*3/uL (ref 0.0–0.1)
EOS ABS: 0.2 10*3/uL (ref 0.0–0.7)
EOS PCT: 3 %
HCT: 41.2 % (ref 36.0–46.0)
Hemoglobin: 13 g/dL (ref 12.0–15.0)
LYMPHS ABS: 1.7 10*3/uL (ref 0.7–4.0)
Lymphocytes Relative: 28 %
MCH: 31 pg (ref 26.0–34.0)
MCHC: 31.6 g/dL (ref 30.0–36.0)
MCV: 98.3 fL (ref 78.0–100.0)
MONOS PCT: 7 %
Monocytes Absolute: 0.4 10*3/uL (ref 0.1–1.0)
NEUTROS PCT: 61 %
Neutro Abs: 3.5 10*3/uL (ref 1.7–7.7)
PLATELETS: 170 10*3/uL (ref 150–400)
RBC: 4.19 MIL/uL (ref 3.87–5.11)
RDW: 12.6 % (ref 11.5–15.5)
WBC: 5.8 10*3/uL (ref 4.0–10.5)

## 2017-07-25 LAB — COMPREHENSIVE METABOLIC PANEL
ALBUMIN: 3.4 g/dL — AB (ref 3.5–5.0)
ALT: 23 U/L (ref 14–54)
ANION GAP: 8 (ref 5–15)
AST: 24 U/L (ref 15–41)
Alkaline Phosphatase: 78 U/L (ref 38–126)
BUN: 16 mg/dL (ref 6–20)
CHLORIDE: 113 mmol/L — AB (ref 101–111)
CO2: 30 mmol/L (ref 22–32)
Calcium: 8.9 mg/dL (ref 8.9–10.3)
Creatinine, Ser: 0.52 mg/dL (ref 0.44–1.00)
GFR calc non Af Amer: >60 mL/min (ref >60)
GLUCOSE: 102 mg/dL — AB (ref 65–99)
POTASSIUM: 3.4 mmol/L — AB (ref 3.5–5.1)
SODIUM: 151 mmol/L — AB (ref 135–145)
Total Bilirubin: 0.5 mg/dL (ref 0.3–1.2)
Total Protein: 6.4 g/dL — ABNORMAL LOW (ref 6.5–8.1)

## 2017-07-26 DIAGNOSIS — F064 Anxiety disorder due to known physiological condition: Secondary | ICD-10-CM | POA: Diagnosis not present

## 2017-07-26 DIAGNOSIS — F0390 Unspecified dementia without behavioral disturbance: Secondary | ICD-10-CM | POA: Diagnosis not present

## 2017-07-26 DIAGNOSIS — R1312 Dysphagia, oropharyngeal phase: Secondary | ICD-10-CM | POA: Diagnosis not present

## 2017-07-26 DIAGNOSIS — M6281 Muscle weakness (generalized): Secondary | ICD-10-CM | POA: Diagnosis not present

## 2017-07-26 DIAGNOSIS — R633 Feeding difficulties: Secondary | ICD-10-CM | POA: Diagnosis not present

## 2017-07-27 ENCOUNTER — Encounter (HOSPITAL_COMMUNITY)
Admission: RE | Admit: 2017-07-27 | Discharge: 2017-07-27 | Disposition: A | Discharge status: Home / Self Care | Payer: Medicare Other | Source: Skilled Nursing Facility | Attending: Internal Medicine | Admitting: Internal Medicine

## 2017-07-27 DIAGNOSIS — R633 Feeding difficulties: Secondary | ICD-10-CM | POA: Diagnosis not present

## 2017-07-27 DIAGNOSIS — E870 Hyperosmolality and hypernatremia: Secondary | ICD-10-CM | POA: Diagnosis not present

## 2017-07-27 DIAGNOSIS — F064 Anxiety disorder due to known physiological condition: Secondary | ICD-10-CM | POA: Diagnosis not present

## 2017-07-27 DIAGNOSIS — R1312 Dysphagia, oropharyngeal phase: Secondary | ICD-10-CM | POA: Diagnosis not present

## 2017-07-27 DIAGNOSIS — E559 Vitamin D deficiency, unspecified: Secondary | ICD-10-CM | POA: Diagnosis not present

## 2017-07-27 DIAGNOSIS — M6281 Muscle weakness (generalized): Secondary | ICD-10-CM | POA: Diagnosis not present

## 2017-07-27 DIAGNOSIS — M810 Age-related osteoporosis without current pathological fracture: Secondary | ICD-10-CM | POA: Insufficient documentation

## 2017-07-27 DIAGNOSIS — F0390 Unspecified dementia without behavioral disturbance: Secondary | ICD-10-CM | POA: Diagnosis not present

## 2017-07-27 LAB — BASIC METABOLIC PANEL
Anion gap: 10 (ref 5–15)
BUN: 18 mg/dL (ref 6–20)
CHLORIDE: 106 mmol/L (ref 101–111)
CO2: 32 mmol/L (ref 22–32)
CREATININE: 0.7 mg/dL (ref 0.44–1.00)
Calcium: 9.2 mg/dL (ref 8.9–10.3)
GFR calc Af Amer: >60 mL/min (ref >60)
GLUCOSE: 119 mg/dL — AB (ref 65–99)
Potassium: 3.6 mmol/L (ref 3.5–5.1)
SODIUM: 148 mmol/L — AB (ref 135–145)

## 2017-07-28 ENCOUNTER — Encounter: Payer: Self-pay | Admitting: Internal Medicine

## 2017-07-28 ENCOUNTER — Non-Acute Institutional Stay (SKILLED_NURSING_FACILITY): Payer: Medicare Other | Admitting: Internal Medicine

## 2017-07-28 DIAGNOSIS — R296 Repeated falls: Secondary | ICD-10-CM

## 2017-07-28 DIAGNOSIS — R633 Feeding difficulties: Secondary | ICD-10-CM | POA: Diagnosis not present

## 2017-07-28 DIAGNOSIS — F0390 Unspecified dementia without behavioral disturbance: Secondary | ICD-10-CM

## 2017-07-28 DIAGNOSIS — M6281 Muscle weakness (generalized): Secondary | ICD-10-CM | POA: Diagnosis not present

## 2017-07-28 DIAGNOSIS — B370 Candidal stomatitis: Secondary | ICD-10-CM | POA: Diagnosis not present

## 2017-07-28 DIAGNOSIS — B3781 Candidal esophagitis: Secondary | ICD-10-CM | POA: Diagnosis not present

## 2017-07-28 DIAGNOSIS — M810 Age-related osteoporosis without current pathological fracture: Secondary | ICD-10-CM | POA: Diagnosis not present

## 2017-07-28 DIAGNOSIS — R1312 Dysphagia, oropharyngeal phase: Secondary | ICD-10-CM | POA: Diagnosis not present

## 2017-07-28 DIAGNOSIS — E870 Hyperosmolality and hypernatremia: Secondary | ICD-10-CM | POA: Diagnosis not present

## 2017-07-28 DIAGNOSIS — F064 Anxiety disorder due to known physiological condition: Secondary | ICD-10-CM | POA: Diagnosis not present

## 2017-07-28 NOTE — Progress Notes (Signed)
Location:   [facility] Room Number: 154/D Place of Service:  SNF (31) Provider:  Pollyann Savoy, MD  Patient Care Team: Mahlon Gammon, MD as PCP - General (Internal Medicine) Synetta Shadow as Physician Assistant (Internal Medicine)  Extended Emergency Contact Information Primary Emergency Contact: [facility] Address: [address] [place], [place] [address] [place] of [place] Home Phone: [phone] Mobile Phone: [phone] Relation: Daughter Secondary Emergency Contact: Gena Fray [place] Mobile Phone: [phone] Relation: Son  Code Status:  DNR Goals of care: Advanced Directive information Advanced Directives 07/28/2017  Does Patient Have a Medical Advance Directive? Yes  Type of Advance Directive Out of facility DNR (pink MOST or yellow form)  Does patient want to make changes to medical advance directive? No - Patient declined  Copy of Healthcare Power of Attorney in Chart? No - copy requested  Would patient like information on creating a medical advance directive? No - Patient declined     Chief Complaint  Patient presents with  . Acute Visit    F/U Fall x 2 days    HPI:  Pt is a 77 y.o. female seen today for an acute visit for 2 falls this morning She has h/o Right Femoral Fracture due to fall, Depression and dementia., Osteoporosis  Patient has end-stage dementia and has had significant cognition decline in the past few months.  Recent labs had shown that her sodium was 151.  She is in restorative.  But per nurses she is keeping food in her mouth and sometimes chokes on it.  Her repeat labs did show her sodium came down to 148 .  She has been started on Magic cup. Her weight is down another 3 pounds from 115 to 112.  She weighed more than 120 a few months ago.  Patient has also developed thrush in her mouth. She also had 2 falls today when she slipped from her  wheelchair.  The staff has put her on the [place] chair now.    Past Medical History:  Diagnosis Date  . Acute encephalopathy 2012   In context of diarrhea and clinical dehydration  . Dementia    Noncompliant with Aricept as per daughter  . Protein-calorie malnutrition, severe (HCC)   . Underweight   . Vertigo    Past Surgical History:  Procedure Laterality Date  . COMPRESSION HIP SCREW Right 09/21/2015   Procedure: COMPRESSION HIP;  Surgeon: Vickki Hearing, MD;  Location: [facility];  Service: Orthopedics;  Laterality: Right;    Allergies  Allergen Reactions  . Beef-Derived Products     Outpatient Encounter Medications as of 07/28/2017  Medication Sig  . acetaminophen (TYLENOL) 325 MG tablet Take 2 tablets (650 mg total) by mouth every 6 (six) hours as needed for mild pain (or Fever >/= 101).  Marland Kitchen alendronate (FOSAMAX) 70 MG tablet Take 70 mg by mouth once a week. Take with a full glass of water on an empty stomach.  Marland Kitchen aspirin EC 325 MG EC tablet Take 1 tablet (325 mg total) by mouth daily with breakfast.  . calcium carbonate (OS-CAL - DOSED IN MG OF ELEMENTAL CALCIUM) 1250 (500 Ca) MG tablet Take 1 tablet by mouth 2 (two) times daily with a meal.  . Cholecalciferol 1000 units tablet Take 1,000 Units by mouth daily.  Marland Kitchen donepezil (ARICEPT) 5 MG tablet Take 5 mg by mouth at bedtime.  Marland Kitchen  magic mouthwash SOLN Take 5 mLs by mouth 4 (four) times daily.  . memantine (NAMENDA) 5 MG tablet Take 5 mg by mouth 2 (two) times daily.  Marland Kitchen senna-docusate (SENOKOT-S) 8.6-50 MG tablet Take 1 tablet by mouth 2 (two) times daily.  . sertraline (ZOLOFT) 25 MG tablet Take 25 mg by mouth daily.  Marland Kitchen triamcinolone (KENALOG) 0.025 % cream Apply 1 application topically 2 (two) times daily.   No facility-administered encounter medications on file as of 07/28/2017.      Review of Systems  Unable to perform ROS: Dementia    Immunization History  Administered Date(s) Administered  . Influenza-Unspecified  03/20/2016, 05/03/2017  . PPD Test 06/12/2015  . Pneumococcal Conjugate-13 05/03/2017  . Pneumococcal-Unspecified 04/02/2016  . Tdap 03/04/2017   Pertinent  Health Maintenance Due  Topic Date Due  . INFLUENZA VACCINE  Completed  . DEXA SCAN  Completed  . PNA vac Low Risk Adult  Completed   Fall Risk  03/01/2017  Falls in the past year? No   Functional Status Survey:    Vitals:   07/28/17 1130  BP: (!) 146/78  Pulse: 72  Resp: 18  Temp: 98.4 F (36.9 C)  TempSrc: Oral   There is no height or weight on file to calculate BMI. Physical Exam  Constitutional: She appears well-developed and well-nourished.  HENT:  Head: Normocephalic.  Dry Mucosa with White patches in back of her mouth  Eyes: Pupils are equal, round, and reactive to light.  Neck: Neck supple.  Cardiovascular: Normal rate and normal heart sounds.  No murmur heard. Pulmonary/Chest: Effort normal and breath sounds normal. No respiratory distress. She has no wheezes. She has no rales.  Abdominal: Soft. Bowel sounds are normal. She exhibits no distension. There is no tenderness. There is no rebound.  Musculoskeletal: She exhibits no edema.  Lymphadenopathy:    She has no cervical adenopathy.  Neurological: She is alert.  Was following simple commands   Skin: Skin is warm and dry.  Psychiatric: She has a normal mood and affect. Her behavior is normal.    Labs reviewed: Recent Labs    07/24/17 0830 07/25/17 0350 07/27/17 0715  NA 148* 151* 148*  K 3.9 3.4* 3.6  CL 107 113* 106  CO2 30 30 32  GLUCOSE 123* 102* 119*  BUN 17 16 18   CREATININE 0.58 0.52 0.70  CALCIUM 9.4 8.9 9.2   Recent Labs    06/19/17 0930 07/24/17 0830 07/25/17 0350  AST 43* 26 24  ALT 48 25 23  ALKPHOS 66 90 78  BILITOT 0.8 0.8 0.5  PROT 7.6 7.1 6.4*  ALBUMIN 3.8 3.9 3.4*   Recent Labs    06/19/17 0930 07/24/17 0830 07/25/17 0350  WBC 5.4 8.1 5.8  NEUTROABS 3.6 6.1 3.5  HGB 13.5 14.9 13.0  HCT 41.5 47.3* 41.2  MCV  95.2 98.7 98.3  PLT 157 174 170   Lab Results  Component Value Date   TSH 2.391 02/19/2017   Lab Results  Component Value Date   HGBA1C 5.4 01/10/2016   No results found for: CHOL, HDL, LDLCALC, LDLDIRECT, TRIG, CHOLHDL  Significant Diagnostic Results in last 30 days:  No results found.  Assessment/Plan  Hypernatremia Discussed in details with the nurses and Dietician.  Also talked to her daughter Her hypernatremia is most likely related to her dementia and not eating.  At this time we will continue restorative feeding Will push for more fluids Will discontinue Fosamax  Will start her on  Remeron 7.5 mg nightly Repeat BMP in few days I do not think she needs IV fluids for now  Mild thrush with ?esophagitis Will discontinue Fosamax Start her on Diflucan for 7 days Recurrent falls Can be related to her dehydration Fall precautions . Patient afebrile does not have a white count do not think that she has any kind of infection Failure to thrive with weight loss Started on Remeron Continue restorative Dementia with depression Continue on Aricept and Zoloft Advanced care planning Discussed with her daughter again.  At this time patient is not a candidate for any aggressive workup.  Her previous an MRI was in 2016 bruising that showed cerebral atrophy with chronic vascular changes.  She is on aspirin, Aricept, Namenda.   No tube feeds per daughter.  We will continue supportive care   Family/ staff Communication:   Labs/tests ordered:  BMP   Total time spent in this patient care encounter was 45_ minutes; greater than 50% of the visit spent counseling patient's Daughter, reviewing records , Labs and coordinating care for problems addressed at this encounter.

## 2017-07-29 DIAGNOSIS — M6281 Muscle weakness (generalized): Secondary | ICD-10-CM | POA: Diagnosis not present

## 2017-07-29 DIAGNOSIS — R633 Feeding difficulties: Secondary | ICD-10-CM | POA: Diagnosis not present

## 2017-07-29 DIAGNOSIS — R1312 Dysphagia, oropharyngeal phase: Secondary | ICD-10-CM | POA: Diagnosis not present

## 2017-07-29 DIAGNOSIS — F0390 Unspecified dementia without behavioral disturbance: Secondary | ICD-10-CM | POA: Diagnosis not present

## 2017-07-29 DIAGNOSIS — F064 Anxiety disorder due to known physiological condition: Secondary | ICD-10-CM | POA: Diagnosis not present

## 2017-07-30 DIAGNOSIS — M6281 Muscle weakness (generalized): Secondary | ICD-10-CM | POA: Diagnosis not present

## 2017-07-30 DIAGNOSIS — R1312 Dysphagia, oropharyngeal phase: Secondary | ICD-10-CM | POA: Diagnosis not present

## 2017-07-30 DIAGNOSIS — R633 Feeding difficulties: Secondary | ICD-10-CM | POA: Diagnosis not present

## 2017-07-30 DIAGNOSIS — F0390 Unspecified dementia without behavioral disturbance: Secondary | ICD-10-CM | POA: Diagnosis not present

## 2017-07-30 DIAGNOSIS — F064 Anxiety disorder due to known physiological condition: Secondary | ICD-10-CM | POA: Diagnosis not present

## 2017-08-02 ENCOUNTER — Encounter (HOSPITAL_COMMUNITY)
Admission: RE | Admit: 2017-08-02 | Discharge: 2017-08-02 | Disposition: A | Discharge status: Home / Self Care | Payer: Medicare Other | Source: Skilled Nursing Facility | Attending: *Deleted | Admitting: *Deleted

## 2017-08-02 DIAGNOSIS — R633 Feeding difficulties: Secondary | ICD-10-CM | POA: Diagnosis not present

## 2017-08-02 DIAGNOSIS — E559 Vitamin D deficiency, unspecified: Secondary | ICD-10-CM | POA: Diagnosis not present

## 2017-08-02 DIAGNOSIS — M6281 Muscle weakness (generalized): Secondary | ICD-10-CM | POA: Diagnosis not present

## 2017-08-02 DIAGNOSIS — R1312 Dysphagia, oropharyngeal phase: Secondary | ICD-10-CM | POA: Diagnosis not present

## 2017-08-02 DIAGNOSIS — F064 Anxiety disorder due to known physiological condition: Secondary | ICD-10-CM | POA: Diagnosis not present

## 2017-08-02 DIAGNOSIS — F0390 Unspecified dementia without behavioral disturbance: Secondary | ICD-10-CM | POA: Diagnosis not present

## 2017-08-02 LAB — BASIC METABOLIC PANEL
Anion gap: 9 (ref 5–15)
BUN: 16 mg/dL (ref 6–20)
CALCIUM: 9.2 mg/dL (ref 8.9–10.3)
CO2: 30 mmol/L (ref 22–32)
CREATININE: 0.69 mg/dL (ref 0.44–1.00)
Chloride: 101 mmol/L (ref 101–111)
Glucose, Bld: 107 mg/dL — ABNORMAL HIGH (ref 65–99)
Potassium: 4.4 mmol/L (ref 3.5–5.1)
SODIUM: 140 mmol/L (ref 135–145)

## 2017-08-03 DIAGNOSIS — F0390 Unspecified dementia without behavioral disturbance: Secondary | ICD-10-CM | POA: Diagnosis not present

## 2017-08-03 DIAGNOSIS — R633 Feeding difficulties: Secondary | ICD-10-CM | POA: Diagnosis not present

## 2017-08-03 DIAGNOSIS — R1312 Dysphagia, oropharyngeal phase: Secondary | ICD-10-CM | POA: Diagnosis not present

## 2017-08-03 DIAGNOSIS — F064 Anxiety disorder due to known physiological condition: Secondary | ICD-10-CM | POA: Diagnosis not present

## 2017-08-03 DIAGNOSIS — M6281 Muscle weakness (generalized): Secondary | ICD-10-CM | POA: Diagnosis not present

## 2017-08-04 ENCOUNTER — Non-Acute Institutional Stay (SKILLED_NURSING_FACILITY): Payer: Medicare Other | Admitting: Internal Medicine

## 2017-08-04 ENCOUNTER — Encounter: Payer: Self-pay | Admitting: Internal Medicine

## 2017-08-04 DIAGNOSIS — F0390 Unspecified dementia without behavioral disturbance: Secondary | ICD-10-CM

## 2017-08-04 DIAGNOSIS — R633 Feeding difficulties: Secondary | ICD-10-CM | POA: Diagnosis not present

## 2017-08-04 DIAGNOSIS — F064 Anxiety disorder due to known physiological condition: Secondary | ICD-10-CM | POA: Diagnosis not present

## 2017-08-04 DIAGNOSIS — R1312 Dysphagia, oropharyngeal phase: Secondary | ICD-10-CM | POA: Diagnosis not present

## 2017-08-04 DIAGNOSIS — M6281 Muscle weakness (generalized): Secondary | ICD-10-CM | POA: Diagnosis not present

## 2017-08-04 NOTE — Progress Notes (Signed)
Location:   [facility] Room Number: 154/D Place of Service:  SNF (31) Provider:  Sabino Dick, MD  Patient Care Team: Mahlon Gammon, MD as PCP - General (Internal Medicine) Synetta Shadow as Physician Assistant (Internal Medicine)  Extended Emergency Contact Information Primary Emergency Contact: [facility] Address: [address] [place], [place] [address] [place] of [place] Home Phone: [phone] Mobile Phone: [phone] Relation: Daughter Secondary Emergency Contact: Gena Fray [place] Mobile Phone: [phone] Relation: Son  Code Status:  DNR Goals of care: Advanced Directive information Advanced Directives 08/04/2017  Does Patient Have a Medical Advance Directive? Yes  Type of Advance Directive Out of facility DNR (pink MOST or yellow form)  Does patient want to make changes to medical advance directive? No - Patient declined  Copy of Healthcare Power of Attorney in Chart? No - copy requested  Would patient like information on creating a medical advance directive? No - Patient declined     Chief Complaint  Patient presents with  . Medical Management of Chronic Issues    Routine Visit   For medical management of chronic medical conditions including dementia with failure to thrive-osteoporosis-depression-recent history of thrush-history of femoral  fracture  HPI:  Pt is a 77 y.o. female seen today for medical management of chronic diseases.  As noted above.  Patient does have significant dementia and appears to be declining with poor p.o. intake at times spitting out her food.  She has had some weight loss most recent weight is 112 pounds back in November was 122 pounds.  She is also had some hypernatremia which actually has improved on recent lab Essentially unobtainable secondary to dementia Dr. Chales Abrahams has discussed this with her daughter who has expressed desire for  not anything aggressive-  She continues on low-dose Aricept and Namenda.--And Remeron was recently added for appetite stimulation  She is also on Zoloft for coexistent depression.  She is on a dysphagia diet with supervision and again her sodium appears to have normalized which is encouraging but her cognitive decline does continue and I suspect this will continue to be a challenge.  She ihas also developed some thrush Dr. Chales Abrahams did discontinue her Fosamax and put her on a short course of Diflucan and this appears to have improved.  She does have a history of a femoral neck fracture on the right and again Fosamax has been discontinued because of thrush concerns she is on vitamin D with calcium.            Past Medical History:  Diagnosis Date  . Acute encephalopathy 2012   In context of diarrhea and clinical dehydration  . Dementia    Noncompliant with Aricept as per daughter  . Protein-calorie malnutrition, severe (HCC)   . Underweight   . Vertigo    Past Surgical History:  Procedure Laterality Date  . COMPRESSION HIP SCREW Right 09/21/2015   Procedure: COMPRESSION HIP;  Surgeon: Vickki Hearing, MD;  Location: [facility];  Service: Orthopedics;  Laterality: Right;    Allergies  Allergen Reactions  . Beef-Derived Products     Outpatient Encounter Medications as of 08/04/2017  Medication Sig  . acetaminophen (TYLENOL) 325 MG tablet Take 2 tablets (650 mg total) by mouth every 6 (six) hours as needed for mild pain (or Fever >/= 101).  Marland Kitchen aspirin EC 325 MG EC tablet Take 1 tablet (325  mg total) by mouth daily with breakfast.  . calcium carbonate (OS-CAL - DOSED IN MG OF ELEMENTAL CALCIUM) 1250 (500 Ca) MG tablet Take 1 tablet by mouth 2 (two) times daily with a meal.  . Cholecalciferol 1000 units tablet Take 1,000 Units by mouth daily.  Marland Kitchen. donepezil (ARICEPT) 5 MG tablet Take 5 mg by mouth at bedtime.  . memantine (NAMENDA) 5 MG tablet Take 5 mg by mouth 2 (two) times daily.   . mirtazapine (REMERON) 15 MG tablet Take 15 mg by mouth at bedtime.  . senna-docusate (SENOKOT-S) 8.6-50 MG tablet Take 1 tablet by mouth 2 (two) times daily.  . sertraline (ZOLOFT) 25 MG tablet Take 25 mg by mouth daily.  Marland Kitchen. triamcinolone (KENALOG) 0.025 % cream Apply 1 application topically 2 (two) times daily.  . [DISCONTINUED] alendronate (FOSAMAX) 70 MG tablet Take 70 mg by mouth once a week. Take with a full glass of water on an empty stomach.  . [DISCONTINUED] magic mouthwash SOLN Take 5 mLs by mouth 4 (four) times daily.   No facility-administered encounter medications on file as of 08/04/2017.      Review of Systems    unobtainable secondary to dementia  Immunization History  Administered Date(s) Administered  . Influenza-Unspecified 03/20/2016, 05/03/2017  . PPD Test 06/12/2015  . Pneumococcal Conjugate-13 05/03/2017  . Pneumococcal-Unspecified 04/02/2016  . Tdap 03/04/2017   Pertinent  Health Maintenance Due  Topic Date Due  . INFLUENZA VACCINE  Completed  . DEXA SCAN  Completed  . PNA vac Low Risk Adult  Completed   Fall Risk  03/01/2017  Falls in the past year? No   Functional Status Survey:    Vitals:   08/04/17 1551  BP: 114/68  Pulse: 62  Resp: 17  Temp: 97.8 F (36.6 C)  TempSrc: Oral  Weight: 112 lb (50.8 kg)  Height: 5\' 2"  (1.575 m)   Body mass index is 20.49 kg/m. Physical Exam  In general this is a frail-appearing elderly female in no distress lying comfortably in bed.  Her skin is warm and dry.  Eyes sclera and conjunctive are clear visual acuity appears to be intact she does make eye contact.  Oropharynx appears to be clear mucous membranes moist.  Chest poor respiratory effort but there is no labored breathing could not really appreciate any significant congestion.  Heart is regular rate and rhythm without murmur gallop or rub she does not have significant lower extremity edema.  Abdomen is soft does not appear to be tender there  are positive bowel sounds.  Musculoskeletal does have general frailty but is able to move her extremities it appears at baseline again with significant weakness especially lower extremities.  Neurologic appears to be grossly intact she is alert makes eye contact but really does not speak much.  Psych she is oriented to self does follow verbal commands today but really does not speak much at all   Labs reviewed: Recent Labs    07/25/17 0350 07/27/17 0715 08/02/17 0630  NA 151* 148* 140  K 3.4* 3.6 4.4  CL 113* 106 101  CO2 30 32 30  GLUCOSE 102* 119* 107*  BUN 16 18 16   CREATININE 0.52 0.70 0.69  CALCIUM 8.9 9.2 9.2   Recent Labs    06/19/17 0930 07/24/17 0830 07/25/17 0350  AST 43* 26 24  ALT 48 25 23  ALKPHOS 66 90 78  BILITOT 0.8 0.8 0.5  PROT 7.6 7.1 6.4*  ALBUMIN 3.8 3.9 3.4*  Recent Labs    06/19/17 0930 07/24/17 0830 07/25/17 0350  WBC 5.4 8.1 5.8  NEUTROABS 3.6 6.1 3.5  HGB 13.5 14.9 13.0  HCT 41.5 47.3* 41.2  MCV 95.2 98.7 98.3  PLT 157 174 170   Lab Results  Component Value Date   TSH 2.391 02/19/2017   Lab Results  Component Value Date   HGBA1C 5.4 01/10/2016   No results found for: CHOL, HDL, LDLCALC, LDLDIRECT, TRIG, CHOLHDL  Significant Diagnostic Results in last 30 days:  No results found.  Assessment/Plan  #1-history of dementia with failure to thrive- this continues to progress although recent labs have been encouraging she continues on a dysphagia diet and fluids are being encouraged apparently this is effective for now-family does not desire aggressive interventions as noted above.-She has been started on Remeron  She continues on low-dose Aricept and Namenda.  2.  History of hypernatremia again this has improved which is encouraging but I suspect her p.o. intake will continue to be somewhat of a challenge-restorative feeding  has been pushing fluids.  3.  Osteoporosis with history of femoral fracture-- again Fosamax has been  discontinued secondary to poor p.o. intake and recent thrush-she is on calcium with vitamin D vitamin D level was within normal range in August.  4.-History of thrush this appears to be largely resolved she is finishing a course of Diflucan.  5.  History of depression again she is on Zoloft in combination again with the Aricept and Namenda for depression.   [id number]

## 2017-08-05 DIAGNOSIS — F064 Anxiety disorder due to known physiological condition: Secondary | ICD-10-CM | POA: Diagnosis not present

## 2017-08-05 DIAGNOSIS — M6281 Muscle weakness (generalized): Secondary | ICD-10-CM | POA: Diagnosis not present

## 2017-08-05 DIAGNOSIS — F0390 Unspecified dementia without behavioral disturbance: Secondary | ICD-10-CM | POA: Diagnosis not present

## 2017-08-05 DIAGNOSIS — R633 Feeding difficulties: Secondary | ICD-10-CM | POA: Diagnosis not present

## 2017-08-05 DIAGNOSIS — R1312 Dysphagia, oropharyngeal phase: Secondary | ICD-10-CM | POA: Diagnosis not present

## 2017-08-06 DIAGNOSIS — R633 Feeding difficulties: Secondary | ICD-10-CM | POA: Diagnosis not present

## 2017-08-06 DIAGNOSIS — F0390 Unspecified dementia without behavioral disturbance: Secondary | ICD-10-CM | POA: Diagnosis not present

## 2017-08-06 DIAGNOSIS — M6281 Muscle weakness (generalized): Secondary | ICD-10-CM | POA: Diagnosis not present

## 2017-08-06 DIAGNOSIS — R1312 Dysphagia, oropharyngeal phase: Secondary | ICD-10-CM | POA: Diagnosis not present

## 2017-08-06 DIAGNOSIS — F064 Anxiety disorder due to known physiological condition: Secondary | ICD-10-CM | POA: Diagnosis not present

## 2017-08-10 DIAGNOSIS — F0390 Unspecified dementia without behavioral disturbance: Secondary | ICD-10-CM | POA: Diagnosis not present

## 2017-08-10 DIAGNOSIS — M6281 Muscle weakness (generalized): Secondary | ICD-10-CM | POA: Diagnosis not present

## 2017-08-10 DIAGNOSIS — F064 Anxiety disorder due to known physiological condition: Secondary | ICD-10-CM | POA: Diagnosis not present

## 2017-08-10 DIAGNOSIS — R633 Feeding difficulties: Secondary | ICD-10-CM | POA: Diagnosis not present

## 2017-08-10 DIAGNOSIS — R1312 Dysphagia, oropharyngeal phase: Secondary | ICD-10-CM | POA: Diagnosis not present

## 2017-08-11 DIAGNOSIS — F0390 Unspecified dementia without behavioral disturbance: Secondary | ICD-10-CM | POA: Diagnosis not present

## 2017-08-11 DIAGNOSIS — R1312 Dysphagia, oropharyngeal phase: Secondary | ICD-10-CM | POA: Diagnosis not present

## 2017-08-11 DIAGNOSIS — M6281 Muscle weakness (generalized): Secondary | ICD-10-CM | POA: Diagnosis not present

## 2017-08-11 DIAGNOSIS — R633 Feeding difficulties: Secondary | ICD-10-CM | POA: Diagnosis not present

## 2017-08-11 DIAGNOSIS — F064 Anxiety disorder due to known physiological condition: Secondary | ICD-10-CM | POA: Diagnosis not present

## 2017-08-12 DIAGNOSIS — F064 Anxiety disorder due to known physiological condition: Secondary | ICD-10-CM | POA: Diagnosis not present

## 2017-08-12 DIAGNOSIS — R1312 Dysphagia, oropharyngeal phase: Secondary | ICD-10-CM | POA: Diagnosis not present

## 2017-08-12 DIAGNOSIS — F0390 Unspecified dementia without behavioral disturbance: Secondary | ICD-10-CM | POA: Diagnosis not present

## 2017-08-12 DIAGNOSIS — R633 Feeding difficulties: Secondary | ICD-10-CM | POA: Diagnosis not present

## 2017-08-12 DIAGNOSIS — M6281 Muscle weakness (generalized): Secondary | ICD-10-CM | POA: Diagnosis not present

## 2017-08-13 DIAGNOSIS — R1312 Dysphagia, oropharyngeal phase: Secondary | ICD-10-CM | POA: Diagnosis not present

## 2017-08-13 DIAGNOSIS — F064 Anxiety disorder due to known physiological condition: Secondary | ICD-10-CM | POA: Diagnosis not present

## 2017-08-13 DIAGNOSIS — F0390 Unspecified dementia without behavioral disturbance: Secondary | ICD-10-CM | POA: Diagnosis not present

## 2017-08-13 DIAGNOSIS — R633 Feeding difficulties: Secondary | ICD-10-CM | POA: Diagnosis not present

## 2017-08-13 DIAGNOSIS — M6281 Muscle weakness (generalized): Secondary | ICD-10-CM | POA: Diagnosis not present

## 2017-08-17 DIAGNOSIS — M6281 Muscle weakness (generalized): Secondary | ICD-10-CM | POA: Diagnosis not present

## 2017-08-17 DIAGNOSIS — F0390 Unspecified dementia without behavioral disturbance: Secondary | ICD-10-CM | POA: Diagnosis not present

## 2017-08-17 DIAGNOSIS — R633 Feeding difficulties: Secondary | ICD-10-CM | POA: Diagnosis not present

## 2017-08-17 DIAGNOSIS — F064 Anxiety disorder due to known physiological condition: Secondary | ICD-10-CM | POA: Diagnosis not present

## 2017-08-17 DIAGNOSIS — R1312 Dysphagia, oropharyngeal phase: Secondary | ICD-10-CM | POA: Diagnosis not present

## 2017-08-19 DIAGNOSIS — R633 Feeding difficulties: Secondary | ICD-10-CM | POA: Diagnosis not present

## 2017-08-19 DIAGNOSIS — R1312 Dysphagia, oropharyngeal phase: Secondary | ICD-10-CM | POA: Diagnosis not present

## 2017-08-19 DIAGNOSIS — M6281 Muscle weakness (generalized): Secondary | ICD-10-CM | POA: Diagnosis not present

## 2017-08-19 DIAGNOSIS — F064 Anxiety disorder due to known physiological condition: Secondary | ICD-10-CM | POA: Diagnosis not present

## 2017-08-19 DIAGNOSIS — F0390 Unspecified dementia without behavioral disturbance: Secondary | ICD-10-CM | POA: Diagnosis not present

## 2017-08-20 DIAGNOSIS — R633 Feeding difficulties: Secondary | ICD-10-CM | POA: Diagnosis not present

## 2017-08-20 DIAGNOSIS — M6281 Muscle weakness (generalized): Secondary | ICD-10-CM | POA: Diagnosis not present

## 2017-08-20 DIAGNOSIS — F064 Anxiety disorder due to known physiological condition: Secondary | ICD-10-CM | POA: Diagnosis not present

## 2017-08-20 DIAGNOSIS — F0390 Unspecified dementia without behavioral disturbance: Secondary | ICD-10-CM | POA: Diagnosis not present

## 2017-08-20 DIAGNOSIS — R1312 Dysphagia, oropharyngeal phase: Secondary | ICD-10-CM | POA: Diagnosis not present

## 2017-08-23 DIAGNOSIS — M6281 Muscle weakness (generalized): Secondary | ICD-10-CM | POA: Diagnosis not present

## 2017-08-23 DIAGNOSIS — R633 Feeding difficulties: Secondary | ICD-10-CM | POA: Diagnosis not present

## 2017-08-23 DIAGNOSIS — F064 Anxiety disorder due to known physiological condition: Secondary | ICD-10-CM | POA: Diagnosis not present

## 2017-08-23 DIAGNOSIS — F0390 Unspecified dementia without behavioral disturbance: Secondary | ICD-10-CM | POA: Diagnosis not present

## 2017-08-23 DIAGNOSIS — R1312 Dysphagia, oropharyngeal phase: Secondary | ICD-10-CM | POA: Diagnosis not present

## 2017-08-24 DIAGNOSIS — M6281 Muscle weakness (generalized): Secondary | ICD-10-CM | POA: Diagnosis not present

## 2017-08-24 DIAGNOSIS — F0390 Unspecified dementia without behavioral disturbance: Secondary | ICD-10-CM | POA: Diagnosis not present

## 2017-08-24 DIAGNOSIS — F064 Anxiety disorder due to known physiological condition: Secondary | ICD-10-CM | POA: Diagnosis not present

## 2017-08-24 DIAGNOSIS — R633 Feeding difficulties: Secondary | ICD-10-CM | POA: Diagnosis not present

## 2017-08-24 DIAGNOSIS — R1312 Dysphagia, oropharyngeal phase: Secondary | ICD-10-CM | POA: Diagnosis not present

## 2017-08-25 DIAGNOSIS — R1312 Dysphagia, oropharyngeal phase: Secondary | ICD-10-CM | POA: Diagnosis not present

## 2017-08-25 DIAGNOSIS — M6281 Muscle weakness (generalized): Secondary | ICD-10-CM | POA: Diagnosis not present

## 2017-08-25 DIAGNOSIS — F064 Anxiety disorder due to known physiological condition: Secondary | ICD-10-CM | POA: Diagnosis not present

## 2017-08-25 DIAGNOSIS — F0390 Unspecified dementia without behavioral disturbance: Secondary | ICD-10-CM | POA: Diagnosis not present

## 2017-08-25 DIAGNOSIS — R633 Feeding difficulties: Secondary | ICD-10-CM | POA: Diagnosis not present

## 2017-08-26 DIAGNOSIS — M6281 Muscle weakness (generalized): Secondary | ICD-10-CM | POA: Diagnosis not present

## 2017-08-26 DIAGNOSIS — R633 Feeding difficulties: Secondary | ICD-10-CM | POA: Diagnosis not present

## 2017-08-26 DIAGNOSIS — R1312 Dysphagia, oropharyngeal phase: Secondary | ICD-10-CM | POA: Diagnosis not present

## 2017-08-26 DIAGNOSIS — F064 Anxiety disorder due to known physiological condition: Secondary | ICD-10-CM | POA: Diagnosis not present

## 2017-08-26 DIAGNOSIS — F0390 Unspecified dementia without behavioral disturbance: Secondary | ICD-10-CM | POA: Diagnosis not present

## 2017-08-27 DIAGNOSIS — R633 Feeding difficulties: Secondary | ICD-10-CM | POA: Diagnosis not present

## 2017-08-27 DIAGNOSIS — R1312 Dysphagia, oropharyngeal phase: Secondary | ICD-10-CM | POA: Diagnosis not present

## 2017-08-27 DIAGNOSIS — F064 Anxiety disorder due to known physiological condition: Secondary | ICD-10-CM | POA: Diagnosis not present

## 2017-08-27 DIAGNOSIS — M6281 Muscle weakness (generalized): Secondary | ICD-10-CM | POA: Diagnosis not present

## 2017-08-27 DIAGNOSIS — F0390 Unspecified dementia without behavioral disturbance: Secondary | ICD-10-CM | POA: Diagnosis not present

## 2017-08-31 DIAGNOSIS — R633 Feeding difficulties: Secondary | ICD-10-CM | POA: Diagnosis not present

## 2017-08-31 DIAGNOSIS — M6281 Muscle weakness (generalized): Secondary | ICD-10-CM | POA: Diagnosis not present

## 2017-08-31 DIAGNOSIS — R1312 Dysphagia, oropharyngeal phase: Secondary | ICD-10-CM | POA: Diagnosis not present

## 2017-08-31 DIAGNOSIS — F0390 Unspecified dementia without behavioral disturbance: Secondary | ICD-10-CM | POA: Diagnosis not present

## 2017-08-31 DIAGNOSIS — F064 Anxiety disorder due to known physiological condition: Secondary | ICD-10-CM | POA: Diagnosis not present

## 2017-09-01 DIAGNOSIS — R1312 Dysphagia, oropharyngeal phase: Secondary | ICD-10-CM | POA: Diagnosis not present

## 2017-09-01 DIAGNOSIS — M6281 Muscle weakness (generalized): Secondary | ICD-10-CM | POA: Diagnosis not present

## 2017-09-01 DIAGNOSIS — F0390 Unspecified dementia without behavioral disturbance: Secondary | ICD-10-CM | POA: Diagnosis not present

## 2017-09-01 DIAGNOSIS — F064 Anxiety disorder due to known physiological condition: Secondary | ICD-10-CM | POA: Diagnosis not present

## 2017-09-01 DIAGNOSIS — R633 Feeding difficulties: Secondary | ICD-10-CM | POA: Diagnosis not present

## 2017-09-03 DIAGNOSIS — F321 Major depressive disorder, single episode, moderate: Secondary | ICD-10-CM | POA: Diagnosis not present

## 2017-09-03 DIAGNOSIS — F064 Anxiety disorder due to known physiological condition: Secondary | ICD-10-CM | POA: Diagnosis not present

## 2017-09-03 DIAGNOSIS — R633 Feeding difficulties: Secondary | ICD-10-CM | POA: Diagnosis not present

## 2017-09-03 DIAGNOSIS — F4323 Adjustment disorder with mixed anxiety and depressed mood: Secondary | ICD-10-CM | POA: Diagnosis not present

## 2017-09-03 DIAGNOSIS — R1312 Dysphagia, oropharyngeal phase: Secondary | ICD-10-CM | POA: Diagnosis not present

## 2017-09-03 DIAGNOSIS — M6281 Muscle weakness (generalized): Secondary | ICD-10-CM | POA: Diagnosis not present

## 2017-09-03 DIAGNOSIS — F0390 Unspecified dementia without behavioral disturbance: Secondary | ICD-10-CM | POA: Diagnosis not present

## 2017-09-06 DIAGNOSIS — R633 Feeding difficulties: Secondary | ICD-10-CM | POA: Diagnosis not present

## 2017-09-06 DIAGNOSIS — F064 Anxiety disorder due to known physiological condition: Secondary | ICD-10-CM | POA: Diagnosis not present

## 2017-09-06 DIAGNOSIS — R1312 Dysphagia, oropharyngeal phase: Secondary | ICD-10-CM | POA: Diagnosis not present

## 2017-09-06 DIAGNOSIS — M6281 Muscle weakness (generalized): Secondary | ICD-10-CM | POA: Diagnosis not present

## 2017-09-06 DIAGNOSIS — F0390 Unspecified dementia without behavioral disturbance: Secondary | ICD-10-CM | POA: Diagnosis not present

## 2017-09-09 DIAGNOSIS — R1312 Dysphagia, oropharyngeal phase: Secondary | ICD-10-CM | POA: Diagnosis not present

## 2017-09-09 DIAGNOSIS — R633 Feeding difficulties: Secondary | ICD-10-CM | POA: Diagnosis not present

## 2017-09-09 DIAGNOSIS — M6281 Muscle weakness (generalized): Secondary | ICD-10-CM | POA: Diagnosis not present

## 2017-09-09 DIAGNOSIS — F064 Anxiety disorder due to known physiological condition: Secondary | ICD-10-CM | POA: Diagnosis not present

## 2017-09-09 DIAGNOSIS — F0390 Unspecified dementia without behavioral disturbance: Secondary | ICD-10-CM | POA: Diagnosis not present

## 2017-09-10 DIAGNOSIS — R633 Feeding difficulties: Secondary | ICD-10-CM | POA: Diagnosis not present

## 2017-09-10 DIAGNOSIS — F0390 Unspecified dementia without behavioral disturbance: Secondary | ICD-10-CM | POA: Diagnosis not present

## 2017-09-10 DIAGNOSIS — R1312 Dysphagia, oropharyngeal phase: Secondary | ICD-10-CM | POA: Diagnosis not present

## 2017-09-10 DIAGNOSIS — F064 Anxiety disorder due to known physiological condition: Secondary | ICD-10-CM | POA: Diagnosis not present

## 2017-09-10 DIAGNOSIS — M6281 Muscle weakness (generalized): Secondary | ICD-10-CM | POA: Diagnosis not present

## 2017-09-12 DIAGNOSIS — M6281 Muscle weakness (generalized): Secondary | ICD-10-CM | POA: Diagnosis not present

## 2017-09-12 DIAGNOSIS — F064 Anxiety disorder due to known physiological condition: Secondary | ICD-10-CM | POA: Diagnosis not present

## 2017-09-12 DIAGNOSIS — R1312 Dysphagia, oropharyngeal phase: Secondary | ICD-10-CM | POA: Diagnosis not present

## 2017-09-12 DIAGNOSIS — F0390 Unspecified dementia without behavioral disturbance: Secondary | ICD-10-CM | POA: Diagnosis not present

## 2017-09-12 DIAGNOSIS — R633 Feeding difficulties: Secondary | ICD-10-CM | POA: Diagnosis not present

## 2017-09-14 DIAGNOSIS — R633 Feeding difficulties: Secondary | ICD-10-CM | POA: Diagnosis not present

## 2017-09-14 DIAGNOSIS — F0390 Unspecified dementia without behavioral disturbance: Secondary | ICD-10-CM | POA: Diagnosis not present

## 2017-09-14 DIAGNOSIS — R1312 Dysphagia, oropharyngeal phase: Secondary | ICD-10-CM | POA: Diagnosis not present

## 2017-09-14 DIAGNOSIS — M6281 Muscle weakness (generalized): Secondary | ICD-10-CM | POA: Diagnosis not present

## 2017-09-14 DIAGNOSIS — F064 Anxiety disorder due to known physiological condition: Secondary | ICD-10-CM | POA: Diagnosis not present

## 2017-09-15 DIAGNOSIS — M6281 Muscle weakness (generalized): Secondary | ICD-10-CM | POA: Diagnosis not present

## 2017-09-15 DIAGNOSIS — R633 Feeding difficulties: Secondary | ICD-10-CM | POA: Diagnosis not present

## 2017-09-15 DIAGNOSIS — R1312 Dysphagia, oropharyngeal phase: Secondary | ICD-10-CM | POA: Diagnosis not present

## 2017-09-15 DIAGNOSIS — F0390 Unspecified dementia without behavioral disturbance: Secondary | ICD-10-CM | POA: Diagnosis not present

## 2017-09-15 DIAGNOSIS — F064 Anxiety disorder due to known physiological condition: Secondary | ICD-10-CM | POA: Diagnosis not present

## 2017-09-16 DIAGNOSIS — R633 Feeding difficulties: Secondary | ICD-10-CM | POA: Diagnosis not present

## 2017-09-16 DIAGNOSIS — M6281 Muscle weakness (generalized): Secondary | ICD-10-CM | POA: Diagnosis not present

## 2017-09-16 DIAGNOSIS — F0390 Unspecified dementia without behavioral disturbance: Secondary | ICD-10-CM | POA: Diagnosis not present

## 2017-09-16 DIAGNOSIS — F064 Anxiety disorder due to known physiological condition: Secondary | ICD-10-CM | POA: Diagnosis not present

## 2017-09-16 DIAGNOSIS — R1312 Dysphagia, oropharyngeal phase: Secondary | ICD-10-CM | POA: Diagnosis not present

## 2017-09-20 DIAGNOSIS — R633 Feeding difficulties: Secondary | ICD-10-CM | POA: Diagnosis not present

## 2017-09-20 DIAGNOSIS — F064 Anxiety disorder due to known physiological condition: Secondary | ICD-10-CM | POA: Diagnosis not present

## 2017-09-20 DIAGNOSIS — M6281 Muscle weakness (generalized): Secondary | ICD-10-CM | POA: Diagnosis not present

## 2017-09-20 DIAGNOSIS — F0390 Unspecified dementia without behavioral disturbance: Secondary | ICD-10-CM | POA: Diagnosis not present

## 2017-09-20 DIAGNOSIS — R1312 Dysphagia, oropharyngeal phase: Secondary | ICD-10-CM | POA: Diagnosis not present

## 2017-09-22 DIAGNOSIS — R633 Feeding difficulties: Secondary | ICD-10-CM | POA: Diagnosis not present

## 2017-09-22 DIAGNOSIS — R1312 Dysphagia, oropharyngeal phase: Secondary | ICD-10-CM | POA: Diagnosis not present

## 2017-09-22 DIAGNOSIS — M6281 Muscle weakness (generalized): Secondary | ICD-10-CM | POA: Diagnosis not present

## 2017-09-22 DIAGNOSIS — F064 Anxiety disorder due to known physiological condition: Secondary | ICD-10-CM | POA: Diagnosis not present

## 2017-09-22 DIAGNOSIS — F0390 Unspecified dementia without behavioral disturbance: Secondary | ICD-10-CM | POA: Diagnosis not present

## 2017-09-23 DIAGNOSIS — F064 Anxiety disorder due to known physiological condition: Secondary | ICD-10-CM | POA: Diagnosis not present

## 2017-09-23 DIAGNOSIS — R262 Difficulty in walking, not elsewhere classified: Secondary | ICD-10-CM | POA: Diagnosis not present

## 2017-09-23 DIAGNOSIS — R1312 Dysphagia, oropharyngeal phase: Secondary | ICD-10-CM | POA: Diagnosis not present

## 2017-09-23 DIAGNOSIS — R633 Feeding difficulties: Secondary | ICD-10-CM | POA: Diagnosis not present

## 2017-09-23 DIAGNOSIS — F0390 Unspecified dementia without behavioral disturbance: Secondary | ICD-10-CM | POA: Diagnosis not present

## 2017-09-23 DIAGNOSIS — I739 Peripheral vascular disease, unspecified: Secondary | ICD-10-CM | POA: Diagnosis not present

## 2017-09-23 DIAGNOSIS — M6281 Muscle weakness (generalized): Secondary | ICD-10-CM | POA: Diagnosis not present

## 2017-09-23 DIAGNOSIS — L603 Nail dystrophy: Secondary | ICD-10-CM | POA: Diagnosis not present

## 2017-09-24 DIAGNOSIS — M6281 Muscle weakness (generalized): Secondary | ICD-10-CM | POA: Diagnosis not present

## 2017-09-24 DIAGNOSIS — F0390 Unspecified dementia without behavioral disturbance: Secondary | ICD-10-CM | POA: Diagnosis not present

## 2017-09-24 DIAGNOSIS — F064 Anxiety disorder due to known physiological condition: Secondary | ICD-10-CM | POA: Diagnosis not present

## 2017-09-24 DIAGNOSIS — R1312 Dysphagia, oropharyngeal phase: Secondary | ICD-10-CM | POA: Diagnosis not present

## 2017-09-24 DIAGNOSIS — R633 Feeding difficulties: Secondary | ICD-10-CM | POA: Diagnosis not present

## 2017-09-27 DIAGNOSIS — R633 Feeding difficulties: Secondary | ICD-10-CM | POA: Diagnosis not present

## 2017-09-27 DIAGNOSIS — F064 Anxiety disorder due to known physiological condition: Secondary | ICD-10-CM | POA: Diagnosis not present

## 2017-09-27 DIAGNOSIS — F0390 Unspecified dementia without behavioral disturbance: Secondary | ICD-10-CM | POA: Diagnosis not present

## 2017-09-27 DIAGNOSIS — R1312 Dysphagia, oropharyngeal phase: Secondary | ICD-10-CM | POA: Diagnosis not present

## 2017-09-27 DIAGNOSIS — M6281 Muscle weakness (generalized): Secondary | ICD-10-CM | POA: Diagnosis not present

## 2017-09-28 DIAGNOSIS — M6281 Muscle weakness (generalized): Secondary | ICD-10-CM | POA: Diagnosis not present

## 2017-09-28 DIAGNOSIS — R1312 Dysphagia, oropharyngeal phase: Secondary | ICD-10-CM | POA: Diagnosis not present

## 2017-09-28 DIAGNOSIS — F064 Anxiety disorder due to known physiological condition: Secondary | ICD-10-CM | POA: Diagnosis not present

## 2017-09-28 DIAGNOSIS — F0390 Unspecified dementia without behavioral disturbance: Secondary | ICD-10-CM | POA: Diagnosis not present

## 2017-09-28 DIAGNOSIS — R633 Feeding difficulties: Secondary | ICD-10-CM | POA: Diagnosis not present

## 2017-09-30 DIAGNOSIS — R633 Feeding difficulties: Secondary | ICD-10-CM | POA: Diagnosis not present

## 2017-09-30 DIAGNOSIS — R1312 Dysphagia, oropharyngeal phase: Secondary | ICD-10-CM | POA: Diagnosis not present

## 2017-09-30 DIAGNOSIS — M6281 Muscle weakness (generalized): Secondary | ICD-10-CM | POA: Diagnosis not present

## 2017-09-30 DIAGNOSIS — F0390 Unspecified dementia without behavioral disturbance: Secondary | ICD-10-CM | POA: Diagnosis not present

## 2017-09-30 DIAGNOSIS — F064 Anxiety disorder due to known physiological condition: Secondary | ICD-10-CM | POA: Diagnosis not present

## 2017-10-04 DIAGNOSIS — F064 Anxiety disorder due to known physiological condition: Secondary | ICD-10-CM | POA: Diagnosis not present

## 2017-10-04 DIAGNOSIS — R633 Feeding difficulties: Secondary | ICD-10-CM | POA: Diagnosis not present

## 2017-10-04 DIAGNOSIS — F0390 Unspecified dementia without behavioral disturbance: Secondary | ICD-10-CM | POA: Diagnosis not present

## 2017-10-04 DIAGNOSIS — M6281 Muscle weakness (generalized): Secondary | ICD-10-CM | POA: Diagnosis not present

## 2017-10-04 DIAGNOSIS — R1312 Dysphagia, oropharyngeal phase: Secondary | ICD-10-CM | POA: Diagnosis not present

## 2017-10-05 DIAGNOSIS — R633 Feeding difficulties: Secondary | ICD-10-CM | POA: Diagnosis not present

## 2017-10-05 DIAGNOSIS — F064 Anxiety disorder due to known physiological condition: Secondary | ICD-10-CM | POA: Diagnosis not present

## 2017-10-05 DIAGNOSIS — M6281 Muscle weakness (generalized): Secondary | ICD-10-CM | POA: Diagnosis not present

## 2017-10-05 DIAGNOSIS — F0390 Unspecified dementia without behavioral disturbance: Secondary | ICD-10-CM | POA: Diagnosis not present

## 2017-10-05 DIAGNOSIS — R1312 Dysphagia, oropharyngeal phase: Secondary | ICD-10-CM | POA: Diagnosis not present

## 2017-10-06 DIAGNOSIS — R633 Feeding difficulties: Secondary | ICD-10-CM | POA: Diagnosis not present

## 2017-10-06 DIAGNOSIS — F0390 Unspecified dementia without behavioral disturbance: Secondary | ICD-10-CM | POA: Diagnosis not present

## 2017-10-06 DIAGNOSIS — R1312 Dysphagia, oropharyngeal phase: Secondary | ICD-10-CM | POA: Diagnosis not present

## 2017-10-06 DIAGNOSIS — F064 Anxiety disorder due to known physiological condition: Secondary | ICD-10-CM | POA: Diagnosis not present

## 2017-10-06 DIAGNOSIS — M6281 Muscle weakness (generalized): Secondary | ICD-10-CM | POA: Diagnosis not present

## 2017-10-12 DIAGNOSIS — H2513 Age-related nuclear cataract, bilateral: Secondary | ICD-10-CM | POA: Diagnosis not present

## 2017-10-14 ENCOUNTER — Non-Acute Institutional Stay (SKILLED_NURSING_FACILITY): Payer: Medicare Other | Admitting: Internal Medicine

## 2017-10-14 ENCOUNTER — Encounter: Payer: Self-pay | Admitting: Internal Medicine

## 2017-10-14 DIAGNOSIS — M810 Age-related osteoporosis without current pathological fracture: Secondary | ICD-10-CM | POA: Diagnosis not present

## 2017-10-14 DIAGNOSIS — F339 Major depressive disorder, recurrent, unspecified: Secondary | ICD-10-CM | POA: Diagnosis not present

## 2017-10-14 DIAGNOSIS — E870 Hyperosmolality and hypernatremia: Secondary | ICD-10-CM | POA: Diagnosis not present

## 2017-10-14 DIAGNOSIS — R627 Adult failure to thrive: Secondary | ICD-10-CM | POA: Diagnosis not present

## 2017-10-14 DIAGNOSIS — F0390 Unspecified dementia without behavioral disturbance: Secondary | ICD-10-CM | POA: Diagnosis not present

## 2017-10-14 NOTE — Progress Notes (Signed)
Location:   [facility] Room Number: 154/D Place of Service:  SNF (31) Provider:  Pollyann SavoyAnjali, Tishawn Friedhoff  Mehlani Blankenburg L, MD  Patient Care Team: Mahlon GammonGupta, Shriyan Arakawa L, MD as PCP - General (Internal Medicine) Synetta ShadowLassen, Arlo C, PA-C as Physician Assistant (Internal Medicine)  Extended Emergency Contact Information Primary Emergency Contact: [facility] Address: [address]          [place], [place] [address] [place] Home Phone: [phone] Mobile Phone: [phone] Relation: Daughter Secondary Emergency Contact: Gena Frayurner,Clark  [place] Mobile Phone: [phone] Relation: Son  Code Status:  DNR Goals of care: Advanced Directive information Advanced Directives 10/14/2017  Does Patient Have a Medical Advance Directive? Yes  Type of Advance Directive Out of facility DNR (pink MOST or yellow form)  Does patient want to make changes to medical advance directive? No - Patient declined  Copy of Healthcare Power of Attorney in Chart? No - copy requested  Would patient like information on creating a medical advance directive? No - Patient declined     Chief Complaint  Patient presents with  . Medical Management of Chronic Issues    Routine Visit    HPI:  Pt is a 77 y.o. female seen today for medical management of chronic diseases.   She has h/o Right Femoral Fracture due to fall, Depression and dementia., Osteoporosis and Hypernatremia with Failure to thrive.  Patient is a long-term resident of facility.  She has a history of end-stage dementia and nurses have noticed significant cognition decline over the past year.  She also had problems with hyponatremia and weight loss. Her Fosamax was stopped.  And she was started on Remeron. Patient has done well since then.  Her weight has now stabilized at 112 lbs.  She did not have any new nursing issues.   Past Medical History:  Diagnosis Date  . Acute encephalopathy 2012   In  context of diarrhea and clinical dehydration  . Dementia    Noncompliant with Aricept as per daughter  . Protein-calorie malnutrition, severe (HCC)   . Underweight   . Vertigo    Past Surgical History:  Procedure Laterality Date  . COMPRESSION HIP SCREW Right 09/21/2015   Procedure: COMPRESSION HIP;  Surgeon: Vickki HearingStanley E Harrison, MD;  Location: [facility];  Service: Orthopedics;  Laterality: Right;    Allergies  Allergen Reactions  . Beef-Derived Products     Outpatient Encounter Medications as of 10/14/2017  Medication Sig  . acetaminophen (TYLENOL) 325 MG tablet Take 2 tablets (650 mg total) by mouth every 6 (six) hours as needed for mild pain (or Fever >/= 101).  Marland Kitchen. aspirin EC 325 MG EC tablet Take 1 tablet (325 mg total) by mouth daily with breakfast.  . calcium carbonate (OS-CAL - DOSED IN MG OF ELEMENTAL CALCIUM) 1250 (500 Ca) MG tablet Take 1 tablet by mouth 2 (two) times daily with a meal.  . Cholecalciferol 1000 units tablet Take 1,000 Units by mouth daily.  Marland Kitchen. donepezil (ARICEPT) 5 MG tablet Take 5 mg by mouth at bedtime.  . memantine (NAMENDA) 5 MG tablet Take 5 mg by mouth 2 (two) times daily.  . mirtazapine (REMERON) 15 MG tablet Take 7.5 mg by mouth at bedtime.   . senna-docusate (SENOKOT-S) 8.6-50 MG tablet Take 1 tablet by mouth 2 (two) times daily.  . sertraline (ZOLOFT) 25 MG tablet Take 25 mg by mouth daily.  Marland Kitchen. triamcinolone (KENALOG) 0.025 % cream Apply 1 application topically 2 (  two) times daily.   No facility-administered encounter medications on file as of 10/14/2017.      Review of Systems  Unable to perform ROS: Dementia    Immunization History  Administered Date(s) Administered  . Influenza-Unspecified 03/20/2016, 05/03/2017  . PPD Test 06/12/2015  . Pneumococcal Conjugate-13 05/03/2017  . Pneumococcal-Unspecified 04/02/2016  . Tdap 03/04/2017   Pertinent  Health Maintenance Due  Topic Date Due  . INFLUENZA VACCINE  01/20/2018  . DEXA SCAN  Completed    . PNA vac Low Risk Adult  Completed   Fall Risk  03/01/2017  Falls in the past year? No   Functional Status Survey:    Vitals:   10/14/17 1639  BP: (!) 134/46  Pulse: 90  Resp: 20  Temp: (!) 96.9 F (36.1 C)   There is no height or weight on file to calculate BMI. Physical Exam  Constitutional: She appears well-developed.  HENT:  Head: Normocephalic.  Mouth/Throat: Oropharynx is clear and moist.  Eyes: Pupils are equal, round, and reactive to light.  Neck: Neck supple.  Cardiovascular: Normal rate and regular rhythm.  No murmur heard. Pulmonary/Chest: Effort normal and breath sounds normal. No stridor. No respiratory distress. She has no wheezes.  Musculoskeletal: She exhibits no edema.  Lymphadenopathy:    She has no cervical adenopathy.  Neurological: She is alert.  Is not oriented.  Sometimes follows simple commands  Skin: Skin is warm and dry.  Psychiatric: She has a normal mood and affect. Her behavior is normal.    Labs reviewed: Recent Labs    07/25/17 0350 07/27/17 0715 08/02/17 0630  NA 151* 148* 140  K 3.4* 3.6 4.4  CL 113* 106 101  CO2 30 32 30  GLUCOSE 102* 119* 107*  BUN 16 18 16   CREATININE 0.52 0.70 0.69  CALCIUM 8.9 9.2 9.2   Recent Labs    06/19/17 0930 07/24/17 0830 07/25/17 0350  AST 43* 26 24  ALT 48 25 23  ALKPHOS 66 90 78  BILITOT 0.8 0.8 0.5  PROT 7.6 7.1 6.4*  ALBUMIN 3.8 3.9 3.4*   Recent Labs    06/19/17 0930 07/24/17 0830 07/25/17 0350  WBC 5.4 8.1 5.8  NEUTROABS 3.6 6.1 3.5  HGB 13.5 14.9 13.0  HCT 41.5 47.3* 41.2  MCV 95.2 98.7 98.3  PLT 157 174 170   Lab Results  Component Value Date   TSH 2.391 02/19/2017   Lab Results  Component Value Date   HGBA1C 5.4 01/10/2016   No results found for: CHOL, HDL, LDLCALC, LDLDIRECT, TRIG, CHOLHDL  Significant Diagnostic Results in last 30 days:  No results found.  Assessment/Plan Dementia with failure to thrive Patient has stabilized on Remeron We will  continue supportive care Continue Aricept and Namenda Has discussed with her daughter who wishes no aggressive treatment at this time. Osteoporosis We will continue calcium and vitamin D Patient did not tolerate Fosamax If patient stabilized we can consider Prolia Depression Continue on Zoloft and Remeron Hypernatremia  we will recheck her BMP   Family/ staff Communication:   Labs/tests ordered:    Total time spent in this patient care encounter was 25_ minutes; greater than 50% of the visit spent counseling patient, reviewing records , Labs and coordinating care for problems addressed at this encounter.

## 2017-10-15 ENCOUNTER — Encounter (HOSPITAL_COMMUNITY)
Admission: RE | Admit: 2017-10-15 | Discharge: 2017-10-15 | Disposition: A | Discharge status: Home / Self Care | Payer: Medicare Other | Source: Skilled Nursing Facility | Attending: Internal Medicine | Admitting: Internal Medicine

## 2017-10-15 DIAGNOSIS — R633 Feeding difficulties: Secondary | ICD-10-CM | POA: Diagnosis not present

## 2017-10-15 DIAGNOSIS — E870 Hyperosmolality and hypernatremia: Secondary | ICD-10-CM | POA: Insufficient documentation

## 2017-10-15 DIAGNOSIS — F0390 Unspecified dementia without behavioral disturbance: Secondary | ICD-10-CM | POA: Insufficient documentation

## 2017-10-15 DIAGNOSIS — M810 Age-related osteoporosis without current pathological fracture: Secondary | ICD-10-CM | POA: Insufficient documentation

## 2017-10-15 DIAGNOSIS — R627 Adult failure to thrive: Secondary | ICD-10-CM | POA: Insufficient documentation

## 2017-10-15 DIAGNOSIS — M6281 Muscle weakness (generalized): Secondary | ICD-10-CM | POA: Insufficient documentation

## 2017-10-15 LAB — CBC WITH DIFFERENTIAL/PLATELET
BASOS ABS: 0 10*3/uL (ref 0.0–0.1)
BASOS PCT: 1 %
Eosinophils Absolute: 0.3 10*3/uL (ref 0.0–0.7)
Eosinophils Relative: 5 %
HEMATOCRIT: 42.9 % (ref 36.0–46.0)
Hemoglobin: 13.4 g/dL (ref 12.0–15.0)
Lymphocytes Relative: 33 %
Lymphs Abs: 1.7 10*3/uL (ref 0.7–4.0)
MCH: 30.2 pg (ref 26.0–34.0)
MCHC: 31.2 g/dL (ref 30.0–36.0)
MCV: 96.6 fL (ref 78.0–100.0)
Monocytes Absolute: 0.4 10*3/uL (ref 0.1–1.0)
Monocytes Relative: 8 %
NEUTROS ABS: 2.8 10*3/uL (ref 1.7–7.7)
Neutrophils Relative %: 53 %
PLATELETS: 168 10*3/uL (ref 150–400)
RBC: 4.44 MIL/uL (ref 3.87–5.11)
RDW: 12.5 % (ref 11.5–15.5)
WBC: 5.3 10*3/uL (ref 4.0–10.5)

## 2017-10-15 LAB — BASIC METABOLIC PANEL
ANION GAP: 12 (ref 5–15)
BUN: 14 mg/dL (ref 6–20)
CALCIUM: 9.5 mg/dL (ref 8.9–10.3)
CO2: 29 mmol/L (ref 22–32)
Chloride: 103 mmol/L (ref 101–111)
Creatinine, Ser: 0.67 mg/dL (ref 0.44–1.00)
Glucose, Bld: 118 mg/dL — ABNORMAL HIGH (ref 65–99)
POTASSIUM: 4.1 mmol/L (ref 3.5–5.1)
Sodium: 144 mmol/L (ref 135–145)

## 2017-11-05 DIAGNOSIS — F4323 Adjustment disorder with mixed anxiety and depressed mood: Secondary | ICD-10-CM | POA: Diagnosis not present

## 2017-11-05 DIAGNOSIS — F321 Major depressive disorder, single episode, moderate: Secondary | ICD-10-CM | POA: Diagnosis not present

## 2017-11-05 DIAGNOSIS — F0390 Unspecified dementia without behavioral disturbance: Secondary | ICD-10-CM | POA: Diagnosis not present

## 2017-11-08 ENCOUNTER — Encounter: Payer: Self-pay | Admitting: Internal Medicine

## 2017-11-09 NOTE — Progress Notes (Signed)
This encounter was created in error - please disregard.

## 2017-11-10 ENCOUNTER — Encounter: Payer: Self-pay | Admitting: Internal Medicine

## 2017-11-10 ENCOUNTER — Non-Acute Institutional Stay (SKILLED_NURSING_FACILITY): Payer: Medicare Other | Admitting: Internal Medicine

## 2017-11-10 DIAGNOSIS — R627 Adult failure to thrive: Secondary | ICD-10-CM | POA: Diagnosis not present

## 2017-11-10 DIAGNOSIS — E559 Vitamin D deficiency, unspecified: Secondary | ICD-10-CM | POA: Diagnosis not present

## 2017-11-10 DIAGNOSIS — F0390 Unspecified dementia without behavioral disturbance: Secondary | ICD-10-CM

## 2017-11-10 DIAGNOSIS — E870 Hyperosmolality and hypernatremia: Secondary | ICD-10-CM

## 2017-11-10 DIAGNOSIS — M810 Age-related osteoporosis without current pathological fracture: Secondary | ICD-10-CM

## 2017-11-10 NOTE — Progress Notes (Signed)
Location:   [facility] Room Number: 154/D Place of Service:  SNF (31) Provider:  Candida Peeling, Freddie Breech, MD  Patient Care Team: Mahlon Gammon, MD as PCP - General (Internal Medicine) Roena Malady, PA-C as Physician Assistant (Internal Medicine)  Extended Emergency Contact Information Primary Emergency Contact: [facility] Address: [address] [place], [place] [address] [place] Home Phone: [phone] Mobile Phone: [phone] Relation: Daughter Secondary Emergency Contact: Gena Fray [place] Mobile Phone: [phone] Relation: Son  Code Status:  DNR Goals of care: Advanced Directive information Advanced Directives 11/10/2017  Does Patient Have a Medical Advance Directive? Yes  Type of Advance Directive Out of facility DNR (pink MOST or yellow form)  Does patient want to make changes to medical advance directive? No - Patient declined  Copy of Healthcare Power of Attorney in Chart? No - copy requested  Would patient like information on creating a medical advance directive? No - Patient declined     Chief Complaint  Patient presents with  . Medical Management of Chronic Issues    Patine being seen for Routine Visit for Medical Management  For medical management of chronic medical conditions including dementia with failure to thrive- history of osteoporosis with right femur fracture in the past-depression- hyper nursing staff does not report me any recent acute issues-natremia  HPI:  Pt is a 77 y.o. female seen today for medical management of chronic diseases.   At one point she had significant weight loss about 10 pounds from earlier this year this appears to have stabilized at around 112 pounds although it appears she is lost about 4 pounds since last month  Per nursing staff she has somewhat of his body appetite but it has not precipitously decline from baseline apparently takes her  supplements fairly well and said she ate yogurt this morning and did well.  She does have a history of a right femur fracture with osteoporosis Fosamax was discontinued secondary to breast concerns and I believe some concerns it was leading with somewhat poor appetite.  She continues on low-dose Aricept and Namenda with a history of dementia.  She also had a history of mild hyponatremia in the past which appears to have normalized we will update this.  Her daughter has requested no aggressive interventions.  Today she appears to be stable resting in bed comfortably she does not speak much but makes eye contact and does speak a small amount.  Her vital signs appear to be stable I      Past Medical History:  Diagnosis Date  . Acute encephalopathy 2012   In context of diarrhea and clinical dehydration  . Dementia    Noncompliant with Aricept as per daughter  . Protein-calorie malnutrition, severe (HCC)   . Underweight   . Vertigo    Past Surgical History:  Procedure Laterality Date  . COMPRESSION HIP SCREW Right 09/21/2015   Procedure: COMPRESSION HIP;  Surgeon: Vickki Hearing, MD;  Location: [facility];  Service: Orthopedics;  Laterality: Right;    Allergies  Allergen Reactions  . Beef-Derived Products     Outpatient Encounter Medications as of 11/10/2017  Medication Sig  . acetaminophen (TYLENOL) 325 MG tablet Take 2 tablets (650 mg total) by mouth every 6 (six) hours as needed for mild pain (or Fever >/= 101).  Marland Kitchen aspirin EC 325 MG EC tablet Take 1 tablet (325 mg total) by  mouth daily with breakfast.  . calcium carbonate (OS-CAL - DOSED IN MG OF ELEMENTAL CALCIUM) 1250 (500 Ca) MG tablet Take 1 tablet by mouth 2 (two) times daily with a meal.  . Cholecalciferol 1000 units tablet Take 1,000 Units by mouth daily.  Marland Kitchen donepezil (ARICEPT) 5 MG tablet Take 5 mg by mouth at bedtime.  . memantine (NAMENDA) 5 MG tablet Take 5 mg by mouth 2 (two) times daily.  . mirtazapine (REMERON)  15 MG tablet Take 7.5 mg by mouth at bedtime.   . senna-docusate (SENOKOT-S) 8.6-50 MG tablet Take 1 tablet by mouth 2 (two) times daily.  . sertraline (ZOLOFT) 25 MG tablet Take 25 mg by mouth daily.  Marland Kitchen triamcinolone (KENALOG) 0.025 % cream Apply 1 application topically 2 (two) times daily.   No facility-administered encounter medications on file as of 11/10/2017.      Review of Systems   --- Is unobtainable secondary to dementia    Immunization History  Administered Date(s) Administered  . Influenza-Unspecified 03/20/2016, 05/03/2017  . PPD Test 06/12/2015  . Pneumococcal Conjugate-13 05/03/2017  . Pneumococcal-Unspecified 04/02/2016  . Tdap 03/04/2017   Pertinent  Health Maintenance Due  Topic Date Due  . INFLUENZA VACCINE  01/20/2018  . DEXA SCAN  Completed  . PNA vac Low Risk Adult  Completed   Fall Risk  03/01/2017  Falls in the past year? No   Functional Status Survey:    Vitals:   11/10/17 1143  BP: 138/74  Pulse: 72  Resp: 18  Temp: 98.4 F (36.9 C)  TempSrc: Oral  Weight: 108 lb 12.8 oz (49.4 kg)  Height:  (1.575 m)   Body mass index is 19.9 kg/m. Physical Exam   In general this is a somewhat frail-appearing elderly female in no distress lying comfortably in bed.  Her skin is warm and dry.  Eyes sclera conjunctive are clear visual acuity appears to be intact she does make eye contact.  Oropharynx clear mucous membranes moist.  Chest is clear to auscultation there is no labored breathing.  Heart regular rate and rhythm without murmur gallop rub she has very mild lower extremity edema bilaterally.  Her abdomen soft nontender with positive bowel sounds.  Musculoskeletal appears able to move her extremities at baseline with significant weakness especially of her lower extremities somewhat limited exam since she is in bed.  Neurologic is grossly intact her speech is clear no lateralizing findings.  Psych she is oriented to self does follow  simple verbal commands makes eye contact speaks minimally.    Labs reviewed: Recent Labs    07/27/17 0715 08/02/17 0630 10/15/17 0737  NA 148* 140 144  K 3.6 4.4 4.1  CL 106 101 103  CO2 32 30 29  GLUCOSE 119* 107* 118*  BUN CREATININE 0.70 0.69 0.67  CALCIUM 9.2 9.2 9.5   Recent Labs    06/19/17 0930 07/24/17 0830 07/25/17 0350  AST 43* 26 24  ALT 48 25 23  ALKPHOS 66 90 78  BILITOT 0.8 0.8 0.5  PROT 7.6 7.1 6.4*  ALBUMIN 3.8 3.9 3.4*   Recent Labs    07/24/17 0830 07/25/17 0350 10/15/17 0737  WBC 8.1 5.8 5.3  NEUTROABS 6.1 3.5 2.8  HGB 14.9 13.0 13.4  HCT 47.3* 41.2 42.9  MCV 98.7 98.3 96.6  PLT 174 170 168   Lab Results  Component Value Date   TSH 2.391 02/19/2017   Lab Results  Component Value Date  HGBA1C 5.4 01/10/2016   No results found for: CHOL, HDL, LDLCALC, LDLDIRECT, TRIG, CHOLHDL  Significant Diagnostic Results in last 30 days:  No results found.  Assessment/Plan  #1 history of dementia with failure to thrive-she has lost a small amount of additional weight although unclear if this is a true trendr per nursing her appetite p.o. intake appears at baseline.  At this point will continue to monitor also will update a metabolic panel with her history of hyper #2 natremia in the past:   She is on low-dose Aricept and Namenda   #2 History of depression she is on Zoloft in addition to the Aricept and Namenda which she is on with her history of dementia   -#3   History of osteoporosis with previous history of right femur fracture- she is no longer on Fosamax as noted above she is on vitamin D with calcium at this point will monitor  --#4 history of vitamin D deficiency.  She is on supplementation vitamin D level was 42.3 back in August will update this as well as a metabolic panel.  Again will continue supportive care will await laboratory results continue to monitor weights and clinical status  closely.  [id number]--[id number]

## 2017-11-11 ENCOUNTER — Encounter (HOSPITAL_COMMUNITY)
Admission: RE | Admit: 2017-11-11 | Discharge: 2017-11-11 | Disposition: A | Discharge status: Home / Self Care | Payer: Medicare Other | Source: Skilled Nursing Facility | Attending: Internal Medicine | Admitting: Internal Medicine

## 2017-11-11 DIAGNOSIS — M810 Age-related osteoporosis without current pathological fracture: Secondary | ICD-10-CM | POA: Insufficient documentation

## 2017-11-11 DIAGNOSIS — R627 Adult failure to thrive: Secondary | ICD-10-CM | POA: Diagnosis not present

## 2017-11-11 DIAGNOSIS — E559 Vitamin D deficiency, unspecified: Secondary | ICD-10-CM | POA: Diagnosis not present

## 2017-11-11 DIAGNOSIS — E870 Hyperosmolality and hypernatremia: Secondary | ICD-10-CM | POA: Insufficient documentation

## 2017-11-11 DIAGNOSIS — F0390 Unspecified dementia without behavioral disturbance: Secondary | ICD-10-CM | POA: Insufficient documentation

## 2017-11-11 DIAGNOSIS — R633 Feeding difficulties: Secondary | ICD-10-CM | POA: Insufficient documentation

## 2017-11-11 DIAGNOSIS — M6281 Muscle weakness (generalized): Secondary | ICD-10-CM | POA: Diagnosis not present

## 2017-11-11 LAB — COMPREHENSIVE METABOLIC PANEL
ALT: 29 U/L (ref 14–54)
AST: 27 U/L (ref 15–41)
Albumin: 4 g/dL (ref 3.5–5.0)
Alkaline Phosphatase: 61 U/L (ref 38–126)
Anion gap: 4 — ABNORMAL LOW (ref 5–15)
BUN: 17 mg/dL (ref 6–20)
CHLORIDE: 103 mmol/L (ref 101–111)
CO2: 33 mmol/L — ABNORMAL HIGH (ref 22–32)
Calcium: 9.2 mg/dL (ref 8.9–10.3)
Creatinine, Ser: 0.62 mg/dL (ref 0.44–1.00)
Glucose, Bld: 99 mg/dL (ref 65–99)
POTASSIUM: 4 mmol/L (ref 3.5–5.1)
Sodium: 140 mmol/L (ref 135–145)
Total Bilirubin: 0.7 mg/dL (ref 0.3–1.2)
Total Protein: 6.9 g/dL (ref 6.5–8.1)

## 2017-11-12 LAB — VITAMIN D 25 HYDROXY (VIT D DEFICIENCY, FRACTURES): Vit D, 25-Hydroxy: 21.9 ng/mL — ABNORMAL LOW (ref 30.0–100.0)

## 2017-12-20 DIAGNOSIS — R262 Difficulty in walking, not elsewhere classified: Secondary | ICD-10-CM | POA: Diagnosis not present

## 2017-12-20 DIAGNOSIS — B351 Tinea unguium: Secondary | ICD-10-CM | POA: Diagnosis not present

## 2017-12-20 DIAGNOSIS — I739 Peripheral vascular disease, unspecified: Secondary | ICD-10-CM | POA: Diagnosis not present

## 2017-12-22 ENCOUNTER — Non-Acute Institutional Stay (SKILLED_NURSING_FACILITY): Payer: Medicare Other | Admitting: Internal Medicine

## 2017-12-22 ENCOUNTER — Encounter: Payer: Self-pay | Admitting: Internal Medicine

## 2017-12-22 DIAGNOSIS — R0989 Other specified symptoms and signs involving the circulatory and respiratory systems: Secondary | ICD-10-CM | POA: Diagnosis not present

## 2017-12-22 DIAGNOSIS — R627 Adult failure to thrive: Secondary | ICD-10-CM

## 2017-12-22 DIAGNOSIS — R05 Cough: Secondary | ICD-10-CM

## 2017-12-22 DIAGNOSIS — F0390 Unspecified dementia without behavioral disturbance: Secondary | ICD-10-CM

## 2017-12-22 DIAGNOSIS — R059 Cough, unspecified: Secondary | ICD-10-CM

## 2017-12-22 NOTE — Progress Notes (Signed)
Location:   [facility] Room Number: 154/D Place of Service:  SNF [phone](31) Provider:  Sabino DickArlo Greycen Felter  Gupta, Anjali L, MD  Patient Care Team: Mahlon GammonGupta, Anjali L, MD as PCP - General (Internal Medicine) Synetta ShadowLassen, Mckaylee Dimalanta C, PA-C as Physician Assistant (Internal Medicine)  Extended Emergency Contact Information [facility]: [facility] Address: [address]          [place], [place] [address] [place] Home Phone: [phone] Mobile Phone: [phone] Relation: Daughter Secondary Emergency Contact: Gena Frayurner,Clark  [place] Mobile Phone: [phone] Relation: Son  Code Status:  DNR Goals of care: Advanced Directive information Advanced Directives 12/22/2017  Does Patient Have a Medical Advance Directive? Yes  Type of Advance Directive Out of facility DNR (pink MOST or yellow form)  Does patient want to make changes to medical advance directive? No - Patient declined  Copy of Healthcare Power of Attorney in Chart? No - copy requested  Would patient like information on creating a medical advance directive? No - Patient declined     Chief Complaint  Patient presents with  . Acute Visit    Patient is being seen for Cold symptoms, Cough with yellowish white mucus    HPI:  Pt is a 77 y.o. female seen today for an acute visit for nursing staff noting a cough.  Patient is a long-term resident of facility with a history of failure to thrive with dementia- as well as history of osteoporosis with right femur fracture in the past-in addition to hyponatremia depression.  Nursing has noted a cough cold-like symptoms and I am following up on this.  I do note she was treated for a left lung pneumonia back in December and responded well to Mucinex and also was on Levaquin for shortperiod    As well as Atrovent nebulizers as needed  Regards to failure to thrive she has lost some weight in the past couple months it looks about 6  pounds --- continues to have somewhat of a spotty appetite- she continues on Remeron for appetite stimulation as well as supplements     Past Medical History:  Diagnosis Date  . Acute encephalopathy 2012   In context of diarrhea and clinical dehydration  . Dementia    Noncompliant with Aricept as per daughter  . Protein-calorie malnutrition, severe (HCC)   . Underweight   . Vertigo    Past Surgical History:  Procedure Laterality Date  . COMPRESSION HIP SCREW Right 09/21/2015   Procedure: COMPRESSION HIP;  Surgeon: Vickki HearingStanley E Harrison, MD;  Location: [facility];  Service: Orthopedics;  Laterality: Right;    Allergies  Allergen Reactions  . Beef-Derived Products     Outpatient Encounter Medications as of 12/22/2017  Medication Sig  . acetaminophen (TYLENOL) 325 MG tablet Take 2 tablets (650 mg total) by mouth every 6 (six) hours as needed for mild pain (or Fever >/= 101).  Marland Kitchen. aspirin EC 325 MG EC tablet Take 1 tablet (325 mg total) by mouth daily with breakfast.  . calcium carbonate (OS-CAL - DOSED IN MG OF ELEMENTAL CALCIUM) 1250 (500 Ca) MG tablet Take 1 tablet by mouth 2 (two) times daily with a meal.  . Cholecalciferol 1000 units tablet Take 1,000 Units by mouth daily.  Marland Kitchen. donepezil (ARICEPT) 5 MG tablet Take 5 mg by mouth at bedtime.  Marland Kitchen. guaiFENesin (MUCINEX) 600 MG 12 hr tablet Take 600 mg by mouth 2 (two) times daily.  Marland Kitchen. ipratropium-albuterol (DUONEB) 0.5-2.5 (3) MG/3ML  SOLN Take 3 mLs by nebulization every 6 (six) hours as needed.  . memantine (NAMENDA) 5 MG tablet Take 5 mg by mouth 2 (two) times daily.  . mirtazapine (REMERON) 15 MG tablet Take 7.5 mg by mouth at bedtime.   . senna-docusate (SENOKOT-S) 8.6-50 MG tablet Take 1 tablet by mouth 2 (two) times daily.  . sertraline (ZOLOFT) 25 MG tablet Take 25 mg by mouth daily.  Marland Kitchen triamcinolone (KENALOG) 0.025 % cream Apply 1 application topically 2 (two) times daily.   No facility-administered encounter medications on file as of  12/22/2017.     Review of Systems   Is essentially unobtainable secondary to dementia please see HPI nursing staff does not report any distress-elevated temperature --or  acute  shortness of breath  Immunization History  Administered Date(s) Administered  . Influenza-Unspecified 03/20/2016, 05/03/2017  . PPD Test 06/12/2015  . Pneumococcal Conjugate-13 05/03/2017  . Pneumococcal-Unspecified 04/02/2016  . Tdap 03/04/2017   Pertinent  Health Maintenance Due  Topic Date Due  . INFLUENZA VACCINE  01/20/2018  . DEXA SCAN  Completed  . PNA vac Low Risk Adult  Completed   Fall Risk  03/01/2017  Falls in the past year? No   Functional Status Survey:    Vitals:   12/22/17 1147  BP: 107/69  Pulse: 91  Resp: 18  Temp: 98.4 F (36.9 C)  TempSrc: Oral    Physical Exam General this is a frail-appearing elderly female in no distress sitting comfortably in her wheelchair.  Her skin is warm and dry.  Eyes sclera and conjunctive are clear visual acuity appears to be intact she has prescription lenses.  I do not note any drainage.  Oropharynx she does have a small amount of food residue mucous membranes appear moist.  Chest poor respiratory effort slight congestion diffuse noted there is no labored breathing.  Heart is regular rate and rhythm without murmur gallop rub she has trace lower extremity edema.  Abdomen is soft nontender with positive bowel sounds.  Musculoskeletal has general frailty but appears able to move her extremities x4 at baseline with lower extremity weakness at baseline.  Neurologic is grossly intact she is alert makes eye, contact--speaks minimally.    Psych as noted above she does follow simple verbal commands with some prompting pleasant--she is pleasant and tries to cooperate Labs reviewed: Recent Labs    08/02/17 0630 10/15/17 0737 11/11/17 0708  NA 140 144 140  K 4.4 4.1 4.0  CL 101 103 103  CO2 30 29 33*  GLUCOSE 107* 118* 99  BUN 16 14 17    CREATININE 0.69 0.67 0.62  CALCIUM 9.2 9.5 9.2   Recent Labs    07/24/17 0830 07/25/17 0350 11/11/17 0708  AST 26 24 27   ALT 25 23 29   ALKPHOS 90 78 61  BILITOT 0.8 0.5 0.7  PROT 7.1 6.4* 6.9  ALBUMIN 3.9 3.4* 4.0   Recent Labs    07/24/17 0830 07/25/17 0350 10/15/17 0737  WBC 8.1 5.8 5.3  NEUTROABS 6.1 3.5 2.8  HGB 14.9 13.0 13.4  HCT 47.3* 41.2 42.9  MCV 98.7 98.3 96.6  PLT 174 170 168   Lab Results  Component Value Date   TSH 2.391 02/19/2017   Lab Results  Component Value Date   HGBA1C 5.4 01/10/2016   No results found for: CHOL, HDL, LDLCALC, LDLDIRECT, TRIG, CHOLHDL  Significant Diagnostic Results in last 30 days:  No results found.  Assessment/Plan  #1-history of cough- will start Mucinex 600  mg twice daily for 7 days and restart her Atrovent nebulizers every 6 hours as needed for 5 days- nursing staff says she may have questionable compliance with this secondary to some agitation- but at least will have it available.  Also will obtain a two-view chest x-ray her vital signs pulse ox monitor vital signs and pulse ox every shift for 72 hours  I did speak with the nursing tech who knows her quite well   --she feels her cough is quite significant as well as congestion- per what she has seen earlier this morning-we will start Levaquin empirically secondary to concerns of a URI   #2 failure to thrive with dementia- family does not desire aggressive interventions she does have a most form- we will order lab work CBC and metabolic panel-and monitor continue to encourage p.o. intake continues on Remeron She is on low-dose Aricept  [phone]

## 2017-12-24 ENCOUNTER — Encounter (HOSPITAL_COMMUNITY)
Admission: RE | Admit: 2017-12-24 | Discharge: 2017-12-24 | Disposition: A | Discharge status: Home / Self Care | Payer: Medicare Other | Source: Skilled Nursing Facility | Attending: Internal Medicine | Admitting: Internal Medicine

## 2017-12-24 DIAGNOSIS — E870 Hyperosmolality and hypernatremia: Secondary | ICD-10-CM | POA: Diagnosis not present

## 2017-12-24 LAB — CBC WITH DIFFERENTIAL/PLATELET
BASOS ABS: 0 10*3/uL (ref 0.0–0.1)
BASOS PCT: 0 %
EOS ABS: 0.3 10*3/uL (ref 0.0–0.7)
Eosinophils Relative: 5 %
HEMATOCRIT: 37.3 % (ref 36.0–46.0)
HEMOGLOBIN: 12 g/dL (ref 12.0–15.0)
Lymphocytes Relative: 51 %
Lymphs Abs: 2.6 10*3/uL (ref 0.7–4.0)
MCH: 31.2 pg (ref 26.0–34.0)
MCHC: 32.2 g/dL (ref 30.0–36.0)
MCV: 96.9 fL (ref 78.0–100.0)
MONOS PCT: 9 %
Monocytes Absolute: 0.5 10*3/uL (ref 0.1–1.0)
NEUTROS ABS: 1.8 10*3/uL (ref 1.7–7.7)
NEUTROS PCT: 35 %
Platelets: 178 10*3/uL (ref 150–400)
RBC: 3.85 MIL/uL — AB (ref 3.87–5.11)
RDW: 12.2 % (ref 11.5–15.5)
WBC: 5.1 10*3/uL (ref 4.0–10.5)

## 2017-12-24 LAB — COMPREHENSIVE METABOLIC PANEL
ALBUMIN: 3.3 g/dL — AB (ref 3.5–5.0)
ALK PHOS: 53 U/L (ref 38–126)
ALT: 26 U/L (ref 0–44)
ANION GAP: 5 (ref 5–15)
AST: 28 U/L (ref 15–41)
BILIRUBIN TOTAL: 0.4 mg/dL (ref 0.3–1.2)
BUN: 13 mg/dL (ref 8–23)
CALCIUM: 8.9 mg/dL (ref 8.9–10.3)
CO2: 30 mmol/L (ref 22–32)
Chloride: 108 mmol/L (ref 98–111)
Creatinine, Ser: 0.65 mg/dL (ref 0.44–1.00)
GFR calc Af Amer: >60 mL/min (ref >60)
GFR calc non Af Amer: >60 mL/min (ref >60)
GLUCOSE: 86 mg/dL (ref 70–99)
Potassium: 3.5 mmol/L (ref 3.5–5.1)
SODIUM: 143 mmol/L (ref 135–145)
Total Protein: 5.9 g/dL — ABNORMAL LOW (ref 6.5–8.1)

## 2018-01-11 ENCOUNTER — Encounter: Payer: Self-pay | Admitting: Internal Medicine

## 2018-01-11 ENCOUNTER — Non-Acute Institutional Stay (SKILLED_NURSING_FACILITY): Payer: Medicare Other | Admitting: Internal Medicine

## 2018-01-11 DIAGNOSIS — M810 Age-related osteoporosis without current pathological fracture: Secondary | ICD-10-CM

## 2018-01-11 DIAGNOSIS — R627 Adult failure to thrive: Secondary | ICD-10-CM

## 2018-01-11 DIAGNOSIS — E559 Vitamin D deficiency, unspecified: Secondary | ICD-10-CM | POA: Diagnosis not present

## 2018-01-11 DIAGNOSIS — R296 Repeated falls: Secondary | ICD-10-CM

## 2018-01-11 DIAGNOSIS — F0390 Unspecified dementia without behavioral disturbance: Secondary | ICD-10-CM

## 2018-01-11 DIAGNOSIS — F339 Major depressive disorder, recurrent, unspecified: Secondary | ICD-10-CM | POA: Diagnosis not present

## 2018-01-11 NOTE — Progress Notes (Signed)
Location:   [facility] Room Number: 154/D Place of Service:  SNF [phone](31) Provider:  Einar CrowAnjali Gupta MD  Mahlon GammonGupta, Anjali L, MD  Patient Care Team: Mahlon GammonGupta, Anjali L, MD as PCP - General (Internal Medicine) Synetta ShadowLassen, Arlo C, PA-C as Physician Assistant (Internal Medicine)  Extended Emergency Contact Information Primary Emergency Contact: [facility] Address: [address]          [place], [place] [address] [place] Home Phone: [phone] Mobile Phone: [phone] Relation: Daughter Secondary Emergency Contact: Gena Frayurner,Clark  [place] Mobile Phone: [phone] Relation: Son  Code Status:  DNR Goals of care: Advanced Directive information Advanced Directives 01/11/2018  Does Patient Have a Medical Advance Directive? Yes  Type of Advance Directive Out of facility DNR (pink MOST or yellow form)  Does patient want to make changes to medical advance directive? No - Patient declined  Copy of Healthcare Power of Attorney in Chart? No - copy requested  Would patient like information on creating a medical advance directive? No - Patient declined     Chief Complaint  Patient presents with  . Medical Management of Chronic Issues    Patient is being seen for Routine Visit of Medical Management   . Acute Visit    Patient c/o Fall no injury    HPI:  Pt is a 77 y.o. female seen today for medical management of chronic diseases.   She has h/o Right Femoral Fracture due to fall, Depression and dementia., Osteoporosis and Hypernatremia with Failure to thrive. Patient is a long-term resident of facility.  She has a history of end-stage dementia.  Her weight had stabilized but now it is down again.  She has gone down from 112 to 106 pounds.  She continues to have poor appetite.  She is on low-dose of Remeron. Patient also had a fall this morning but did not sustain any injury. No new nursing issues Patient unable to give me any history due  to her dementia   Past Medical History:  Diagnosis Date  . Acute encephalopathy 2012   In context of diarrhea and clinical dehydration  . Dementia    Noncompliant with Aricept as per daughter  . Protein-calorie malnutrition, severe (HCC)   . Underweight   . Vertigo    Past Surgical History:  Procedure Laterality Date  . COMPRESSION HIP SCREW Right 09/21/2015   Procedure: COMPRESSION HIP;  Surgeon: Vickki HearingStanley E Harrison, MD;  Location: [facility];  Service: Orthopedics;  Laterality: Right;    Allergies  Allergen Reactions  . Beef-Derived Products     Outpatient Encounter Medications as of 01/11/2018  Medication Sig  . acetaminophen (TYLENOL) 325 MG tablet Take 2 tablets (650 mg total) by mouth every 6 (six) hours as needed for mild pain (or Fever >/= 101).  Marland Kitchen. aspirin EC 325 MG EC tablet Take 1 tablet (325 mg total) by mouth daily with breakfast.  . calcium carbonate (OS-CAL - DOSED IN MG OF ELEMENTAL CALCIUM) 1250 (500 Ca) MG tablet Take 1 tablet by mouth 2 (two) times daily with a meal.  . Cholecalciferol 1000 units tablet Take 1,000 Units by mouth daily.  Marland Kitchen. donepezil (ARICEPT) 5 MG tablet Take 5 mg by mouth at bedtime.  . memantine (NAMENDA) 5 MG tablet Take 5 mg by mouth 2 (two) times daily.  . mirtazapine (REMERON) 15 MG tablet Take 7.5 mg by mouth at bedtime.   . senna-docusate (SENOKOT-S) 8.6-50 MG tablet Take 1 tablet by mouth 2 (  two) times daily.  . sertraline (ZOLOFT) 25 MG tablet Take 25 mg by mouth daily.  Marland Kitchen triamcinolone (KENALOG) 0.025 % cream Apply 1 application topically 2 (two) times daily.  . [DISCONTINUED] guaiFENesin (MUCINEX) 600 MG 12 hr tablet Take 600 mg by mouth 2 (two) times daily.  . [DISCONTINUED] ipratropium-albuterol (DUONEB) 0.5-2.5 (3) MG/3ML SOLN Take 3 mLs by nebulization every 6 (six) hours as needed.   No facility-administered encounter medications on file as of 01/11/2018.      Review of Systems  Unable to perform ROS: Dementia    Immunization  History  Administered Date(s) Administered  . Influenza-Unspecified 03/20/2016, 05/03/2017  . PPD Test 06/12/2015  . Pneumococcal Conjugate-13 05/03/2017  . Pneumococcal-Unspecified 04/02/2016  . Tdap 03/04/2017   Pertinent  Health Maintenance Due  Topic Date Due  . INFLUENZA VACCINE  01/20/2018  . DEXA SCAN  Completed  . PNA vac Low Risk Adult  Completed   Fall Risk  03/01/2017  Falls in the past year? No   Functional Status Survey:    Vitals:   01/11/18 1418  BP: (!) 148/95  Pulse: 83  Resp: 20  Temp: 97.9 F (36.6 C)  TempSrc: Oral   There is no height or weight on file to calculate BMI. Physical Exam  Constitutional: She appears well-developed.  HENT:  Head: Normocephalic.  Mouth/Throat: Oropharynx is clear and moist.  Eyes: Pupils are equal, round, and reactive to light.  Neck: Neck supple.  Cardiovascular: Normal rate and regular rhythm.  No murmur heard. Pulmonary/Chest: Effort normal and breath sounds normal. No stridor. No respiratory distress. She has no wheezes.  Musculoskeletal: She exhibits edema.  Mild edema  Lymphadenopathy:    She has no cervical adenopathy.  Neurological: She is alert.  Is not oriented.  Sometimes follows simple commands  Skin: Skin is warm and dry.  Psychiatric: She has a normal mood and affect. Her behavior is normal.    Labs reviewed: Recent Labs    10/15/17 0737 11/11/17 0708 12/24/17 0415  NA 144 140 143  K 4.1 4.0 3.5  CL 103 103 108  CO2 29 33* 30  GLUCOSE 118* 99 86  BUN 14 17 13   CREATININE 0.67 0.62 0.65  CALCIUM 9.5 9.2 8.9   Recent Labs    07/25/17 0350 11/11/17 0708 12/24/17 0415  AST 24 27 28   ALT 23 29 26   ALKPHOS 78 61 53  BILITOT 0.5 0.7 0.4  PROT 6.4* 6.9 5.9*  ALBUMIN 3.4* 4.0 3.3*   Recent Labs    07/25/17 0350 10/15/17 0737 12/24/17 0415  WBC 5.8 5.3 5.1  NEUTROABS 3.5 2.8 1.8  HGB 13.0 13.4 12.0  HCT 41.2 42.9 37.3  MCV 98.3 96.6 96.9  PLT 170 168 178   Lab Results    Component Value Date   TSH 2.391 02/19/2017   Lab Results  Component Value Date   HGBA1C 5.4 01/10/2016   No results found for: CHOL, HDL, LDLCALC, LDLDIRECT, TRIG, CHOLHDL  Significant Diagnostic Results in last 30 days:  No results found.  Assessment/Plan  Dementia with failure to thrive Patient had stabilized on Remeron but now continues to lose weight Will increase the dose of Remeron to 15mg  QHS Continue on Aricept and Namenda Have discussed with her daughter who wishes no aggressive treatment at this time. Osteoporosis We will continue calcium and vitamin D Her Vit D level Was low will increase it to 2000 Units QD Patient did not tolerate Fosamax If patient stabilized we  can consider Prolia Depression Continue on Zoloft and Remeron  Family/ staff Communication:   Labs/tests ordered:   Total time spent in this patient care encounter was 25_ minutes; greater than 50% of the visit spent counseling patient, reviewing records , Labs and coordinating care for problems addressed at this encounter.

## 2018-01-19 DIAGNOSIS — F321 Major depressive disorder, single episode, moderate: Secondary | ICD-10-CM | POA: Diagnosis not present

## 2018-01-19 DIAGNOSIS — F0390 Unspecified dementia without behavioral disturbance: Secondary | ICD-10-CM | POA: Diagnosis not present

## 2018-01-19 DIAGNOSIS — F4323 Adjustment disorder with mixed anxiety and depressed mood: Secondary | ICD-10-CM | POA: Diagnosis not present

## 2018-02-01 ENCOUNTER — Non-Acute Institutional Stay (SKILLED_NURSING_FACILITY): Payer: Medicare Other | Admitting: Internal Medicine

## 2018-02-01 ENCOUNTER — Encounter: Payer: Self-pay | Admitting: Internal Medicine

## 2018-02-01 DIAGNOSIS — M810 Age-related osteoporosis without current pathological fracture: Secondary | ICD-10-CM

## 2018-02-01 DIAGNOSIS — I10 Essential (primary) hypertension: Secondary | ICD-10-CM

## 2018-02-01 DIAGNOSIS — F0390 Unspecified dementia without behavioral disturbance: Secondary | ICD-10-CM

## 2018-02-01 DIAGNOSIS — R627 Adult failure to thrive: Secondary | ICD-10-CM

## 2018-02-01 DIAGNOSIS — E559 Vitamin D deficiency, unspecified: Secondary | ICD-10-CM

## 2018-02-01 NOTE — Progress Notes (Signed)
Location:    [facility] Room Number: 154/D Place of Service:  SNF [phone](31) Provider: Edmon CrapeArlo Lassen PA-C  Mahlon GammonGupta, Anjali L, MD  Patient Care Team: Mahlon GammonGupta, Anjali L, MD as PCP - General (Internal Medicine) Synetta ShadowLassen, Arlo C, PA-C as Physician Assistant (Internal Medicine)  Extended Emergency Contact Information [facility] Emergency Contact: [facility] Address: [address]          [place], [place] [address] [place] Home Phone: [phone] Mobile Phone: [phone] Relation: Daughter Secondary Emergency Contact: Gena Frayurner,Clark  [place] Mobile Phone: [phone] Relation: Son  Code Status:  DNR Goals of care: Advanced Directive information Advanced Directives 02/01/2018  Does Patient Have a Medical Advance Directive? Yes  Type of Advance Directive Out of facility DNR (pink MOST or yellow form)  Does patient want to make changes to medical advance directive? No - Patient declined  Copy of Healthcare Power of Attorney in Chart? No - copy requested  Would patient like information on creating a medical advance directive? No - Patient declined     Chief Complaint  Patient presents with  . Medical Management of Chronic Issues    Patient is being seen for routine visit of medical management   Medical management of chronic medical issues including end-stage dementia- osteoporosis with history of right femoral fracture in the past after a fall- failure to thrive with hypernatremia-depression-constipation-vitamin D deficiency  HPI:  Pt is a 77 y.o. female seen today for medical management of chronic diseases.  As noted above.  Nursing staff has not reported any recent acute issues.  When she was seen last month for routine visit she had lost weight but this appears to have stabilized now at around 107 pounds- her Remeron was increased at that time.  She does have a history of severe dementia-family did not desire aggressive  interventions- she has had hyponatremia in the past but this appears to have stabilized last month her sodium was 143- we will update this.  She also has a history of osteoporosis with a right femoral fracture in the past she is on calcium with vitamin D recent vitamin D level was low at 21.9 and this being supplemented.  Vital signs appear to be stable blood pressure is somewhat elevated at around 150 today will monitor this I see previous reading 137/67   Past Medical History:  Diagnosis Date  . Acute encephalopathy 2012   In context of diarrhea and clinical dehydration  . Dementia    Noncompliant with Aricept as per daughter  . Protein-calorie malnutrition, severe (HCC)   . Underweight   . Vertigo    Past Surgical History:  Procedure Laterality Date  . COMPRESSION HIP SCREW Right 09/21/2015   Procedure: COMPRESSION HIP;  Surgeon: Vickki HearingStanley E Harrison, MD;  Location: [facility];  Service: Orthopedics;  Laterality: Right;    Allergies  Allergen Reactions  . Beef-Derived Products     Outpatient Encounter Medications as of 02/01/2018  Medication Sig  . acetaminophen (TYLENOL) 325 MG tablet Take 2 tablets (650 mg total) by mouth every 6 (six) hours as needed for mild pain (or Fever >/= 101).  Marland Kitchen. aspirin EC 325 MG EC tablet Take 1 tablet (325 mg total) by mouth daily with breakfast.  . calcium carbonate (OS-CAL - DOSED IN MG OF ELEMENTAL CALCIUM) 1250 (500 Ca) MG tablet Take 1 tablet by mouth 2 (two) times daily with a meal.  . Cholecalciferol 1000 units tablet Take 2,000 Units by mouth  daily.   . donepezil (ARICEPT) 5 MG tablet Take 5 mg by mouth at bedtime.  . memantine (NAMENDA) 5 MG tablet Take 5 mg by mouth 2 (two) times daily.  . mirtazapine (REMERON) 15 MG tablet Take 7.5 mg by mouth at bedtime.   . senna-docusate (SENOKOT-S) 8.6-50 MG tablet Take 1 tablet by mouth 2 (two) times daily.  . sertraline (ZOLOFT) 25 MG tablet Take 25 mg by mouth daily.  Marland Kitchen triamcinolone (KENALOG) 0.025 %  cream Apply 1 application topically 2 (two) times daily.   No facility-administered encounter medications on file as of 02/01/2018.      Review of Systems   Is unobtainable secondary to dementia  Immunization History  Administered Date(s) Administered  . Influenza-Unspecified 03/20/2016, 05/03/2017  . PPD Test 06/12/2015  . Pneumococcal Conjugate-13 05/03/2017  . Pneumococcal-Unspecified 04/02/2016  . Tdap 03/04/2017   Pertinent  Health Maintenance Due  Topic Date Due  . INFLUENZA VACCINE  03/04/2018 (Originally 01/20/2018)  . DEXA SCAN  Completed  . PNA vac Low Risk Adult  Completed   Fall Risk  03/01/2017  Falls in the past year? No   Functional Status Survey:    Vitals:   02/01/18 1155  BP: 137/67  Pulse: 72  Resp: 18  Temp: 97.9 F (36.6 C)  TempSrc: Oral  Weight: 107 lb 9.6 oz (48.8 kg)  Height: 5\' 2"  (1.575 m)   Body mass index is 19.68 kg/m. Physical Exam   In general this is a somewhat frail elderly female who appears to be at her baseline she is bright alert makes eye contact and does speak at times.  Her skin is warm and dry.  Eyes sclera and conjunctive are clear visual acuity appears to be intact she does make eye contact.  Oropharynx is clear mucous membranes moist.  Chest slight coarse breath sounds in the anterior fields but cleared with cough.  There is no labored breathing.  Heart is regular rate and rhythm without murmur gallop or rub she has mild lower extremity edema this appears relatively baseline with previous exams.  Abdomen is soft nontender with positive bowel sounds.  Musculoskeletal Limited exam since she is in bed but appears able to move all extremities x4 is able to grip bedrails and help turn herself-does have some stiffness of her lower extremities.  Neurologic she is alert makes eye contact speaks at times  Psych she is oriented to self does follow simple verbal commands but at times takes a while  Labs reviewed: Recent  Labs    10/15/17 0737 11/11/17 0708 12/24/17 0415  NA 144 140 143  K 4.1 4.0 3.5  CL 103 103 108  CO2 29 33* 30  GLUCOSE 118* 99 86  BUN 14 17 13   CREATININE 0.67 0.62 0.65  CALCIUM 9.5 9.2 8.9   Recent Labs    07/25/17 0350 11/11/17 0708 12/24/17 0415  AST 24 27 28   ALT 23 29 26   ALKPHOS 78 61 53  BILITOT 0.5 0.7 0.4  PROT 6.4* 6.9 5.9*  ALBUMIN 3.4* 4.0 3.3*   Recent Labs    07/25/17 0350 10/15/17 0737 12/24/17 0415  WBC 5.8 5.3 5.1  NEUTROABS 3.5 2.8 1.8  HGB 13.0 13.4 12.0  HCT 41.2 42.9 37.3  MCV 98.3 96.6 96.9  PLT 170 168 178   Lab Results  Component Value Date   TSH 2.391 02/19/2017   Lab Results  Component Value Date   HGBA1C 5.4 01/10/2016   No results found  for: CHOL, HDL, LDLCALC, LDLDIRECT, TRIG, CHOLHDL  Significant Diagnostic Results in last 30 days:  No results found.  Assessment/Plan  #1 dementia with failure to thrive-her weight appears to have stabilized somewhat she is on an increased dose of Remeron.   continues on Aricept and Namenda  Family again does not desire aggressive interventions- continue supportive care.  2.  History of hyponatremia with failure to thrive will update labs-again her stable weight is reassuring although again with her level of dementia I suspect this will be a challenge in the future.  3.  History of osteoporosis with previous history of femur fracture- she is on calcium with vitamin D-vitamin D is being supplemented with vitamin D level of 21.9 on recent lab- will update vitamin D level.  4.-  History of constipation continues on Senokot.  5 history depression she does continue on Zoloft.  As well as Remeron again Remeron also for appetite stimulation  6.  Hypertension?  Systolic is somewhat elevated today at 150 will order blood pressure checks twice daily with a log for provider review  Again with her condition would like to minimize medications but if blood pressure consistently elevated will  consider.  [id number]

## 2018-02-02 ENCOUNTER — Emergency Department (HOSPITAL_COMMUNITY): Payer: Medicare Other

## 2018-02-02 ENCOUNTER — Other Ambulatory Visit: Payer: Self-pay

## 2018-02-02 ENCOUNTER — Encounter (HOSPITAL_COMMUNITY): Payer: Self-pay | Admitting: Emergency Medicine

## 2018-02-02 ENCOUNTER — Encounter (HOSPITAL_COMMUNITY)
Admission: RE | Admit: 2018-02-02 | Discharge: 2018-02-02 | Disposition: A | Discharge status: Home / Self Care | Payer: Medicare Other | Source: Skilled Nursing Facility | Attending: Internal Medicine | Admitting: Internal Medicine

## 2018-02-02 ENCOUNTER — Inpatient Hospital Stay (HOSPITAL_COMMUNITY)
Admission: EM | Admit: 2018-02-02 | Discharge: 2018-02-04 | DRG: 057 | Disposition: A | Discharge status: Skilled Nursing Facility | Payer: Medicare Other | Attending: Neurology | Admitting: Neurology

## 2018-02-02 DIAGNOSIS — F0390 Unspecified dementia without behavioral disturbance: Secondary | ICD-10-CM | POA: Diagnosis not present

## 2018-02-02 DIAGNOSIS — I639 Cerebral infarction, unspecified: Secondary | ICD-10-CM | POA: Diagnosis not present

## 2018-02-02 DIAGNOSIS — R5381 Other malaise: Secondary | ICD-10-CM | POA: Diagnosis present

## 2018-02-02 DIAGNOSIS — R4701 Aphasia: Secondary | ICD-10-CM | POA: Diagnosis not present

## 2018-02-02 DIAGNOSIS — R627 Adult failure to thrive: Secondary | ICD-10-CM | POA: Diagnosis not present

## 2018-02-02 DIAGNOSIS — R29810 Facial weakness: Secondary | ICD-10-CM | POA: Diagnosis present

## 2018-02-02 DIAGNOSIS — R633 Feeding difficulties: Secondary | ICD-10-CM | POA: Insufficient documentation

## 2018-02-02 DIAGNOSIS — Z818 Family history of other mental and behavioral disorders: Secondary | ICD-10-CM

## 2018-02-02 DIAGNOSIS — E43 Unspecified severe protein-calorie malnutrition: Secondary | ICD-10-CM | POA: Diagnosis not present

## 2018-02-02 DIAGNOSIS — Z681 Body mass index (BMI) 19 or less, adult: Secondary | ICD-10-CM | POA: Diagnosis not present

## 2018-02-02 DIAGNOSIS — I10 Essential (primary) hypertension: Secondary | ICD-10-CM | POA: Diagnosis present

## 2018-02-02 DIAGNOSIS — Z7982 Long term (current) use of aspirin: Secondary | ICD-10-CM

## 2018-02-02 DIAGNOSIS — R64 Cachexia: Secondary | ICD-10-CM | POA: Diagnosis present

## 2018-02-02 DIAGNOSIS — R29708 NIHSS score 8: Secondary | ICD-10-CM | POA: Diagnosis present

## 2018-02-02 DIAGNOSIS — F0280 Dementia in other diseases classified elsewhere without behavioral disturbance: Secondary | ICD-10-CM | POA: Diagnosis present

## 2018-02-02 DIAGNOSIS — Z808 Family history of malignant neoplasm of other organs or systems: Secondary | ICD-10-CM

## 2018-02-02 DIAGNOSIS — G3101 Pick's disease: Secondary | ICD-10-CM | POA: Diagnosis present

## 2018-02-02 DIAGNOSIS — E870 Hyperosmolality and hypernatremia: Secondary | ICD-10-CM | POA: Insufficient documentation

## 2018-02-02 DIAGNOSIS — E440 Moderate protein-calorie malnutrition: Secondary | ICD-10-CM | POA: Diagnosis present

## 2018-02-02 DIAGNOSIS — M2550 Pain in unspecified joint: Secondary | ICD-10-CM | POA: Diagnosis not present

## 2018-02-02 DIAGNOSIS — R4781 Slurred speech: Secondary | ICD-10-CM | POA: Diagnosis not present

## 2018-02-02 DIAGNOSIS — I5030 Unspecified diastolic (congestive) heart failure: Secondary | ICD-10-CM | POA: Diagnosis not present

## 2018-02-02 DIAGNOSIS — I959 Hypotension, unspecified: Secondary | ICD-10-CM | POA: Diagnosis not present

## 2018-02-02 DIAGNOSIS — F0150 Vascular dementia without behavioral disturbance: Secondary | ICD-10-CM | POA: Diagnosis not present

## 2018-02-02 DIAGNOSIS — R29705 NIHSS score 5: Secondary | ICD-10-CM | POA: Diagnosis present

## 2018-02-02 DIAGNOSIS — Z79899 Other long term (current) drug therapy: Secondary | ICD-10-CM | POA: Diagnosis not present

## 2018-02-02 DIAGNOSIS — Z7401 Bed confinement status: Secondary | ICD-10-CM | POA: Diagnosis not present

## 2018-02-02 DIAGNOSIS — M810 Age-related osteoporosis without current pathological fracture: Secondary | ICD-10-CM | POA: Insufficient documentation

## 2018-02-02 DIAGNOSIS — M6281 Muscle weakness (generalized): Secondary | ICD-10-CM | POA: Insufficient documentation

## 2018-02-02 DIAGNOSIS — I69354 Hemiplegia and hemiparesis following cerebral infarction affecting left non-dominant side: Secondary | ICD-10-CM | POA: Diagnosis not present

## 2018-02-02 DIAGNOSIS — R479 Unspecified speech disturbances: Secondary | ICD-10-CM | POA: Diagnosis not present

## 2018-02-02 DIAGNOSIS — E785 Hyperlipidemia, unspecified: Secondary | ICD-10-CM | POA: Diagnosis present

## 2018-02-02 DIAGNOSIS — R404 Transient alteration of awareness: Secondary | ICD-10-CM | POA: Diagnosis not present

## 2018-02-02 DIAGNOSIS — R54 Age-related physical debility: Secondary | ICD-10-CM | POA: Diagnosis not present

## 2018-02-02 DIAGNOSIS — I6789 Other cerebrovascular disease: Secondary | ICD-10-CM | POA: Diagnosis not present

## 2018-02-02 DIAGNOSIS — Z8673 Personal history of transient ischemic attack (TIA), and cerebral infarction without residual deficits: Secondary | ICD-10-CM | POA: Diagnosis not present

## 2018-02-02 HISTORY — DX: Cerebral infarction, unspecified: I63.9

## 2018-02-02 LAB — CBC WITH DIFFERENTIAL/PLATELET
BASOS PCT: 0 %
Basophils Absolute: 0 10*3/uL (ref 0.0–0.1)
EOS ABS: 0.1 10*3/uL (ref 0.0–0.7)
Eosinophils Relative: 3 %
HEMATOCRIT: 41.6 % (ref 36.0–46.0)
Hemoglobin: 13.6 g/dL (ref 12.0–15.0)
Lymphocytes Relative: 29 %
Lymphs Abs: 1.6 10*3/uL (ref 0.7–4.0)
MCH: 31.8 pg (ref 26.0–34.0)
MCHC: 32.7 g/dL (ref 30.0–36.0)
MCV: 97.2 fL (ref 78.0–100.0)
MONO ABS: 0.3 10*3/uL (ref 0.1–1.0)
Monocytes Relative: 6 %
NEUTROS ABS: 3.3 10*3/uL (ref 1.7–7.7)
Neutrophils Relative %: 62 %
PLATELETS: 164 10*3/uL (ref 150–400)
RBC: 4.28 MIL/uL (ref 3.87–5.11)
RDW: 12.5 % (ref 11.5–15.5)
WBC: 5.3 10*3/uL (ref 4.0–10.5)

## 2018-02-02 LAB — CBC
HCT: 40.9 % (ref 36.0–46.0)
HEMOGLOBIN: 13.2 g/dL (ref 12.0–15.0)
MCH: 31.1 pg (ref 26.0–34.0)
MCHC: 32.3 g/dL (ref 30.0–36.0)
MCV: 96.5 fL (ref 78.0–100.0)
PLATELETS: 164 10*3/uL (ref 150–400)
RBC: 4.24 MIL/uL (ref 3.87–5.11)
RDW: 12.5 % (ref 11.5–15.5)
WBC: 5.8 10*3/uL (ref 4.0–10.5)

## 2018-02-02 LAB — BASIC METABOLIC PANEL
ANION GAP: 8 (ref 5–15)
BUN: 17 mg/dL (ref 8–23)
CALCIUM: 9 mg/dL (ref 8.9–10.3)
CO2: 29 mmol/L (ref 22–32)
CREATININE: 0.72 mg/dL (ref 0.44–1.00)
Chloride: 105 mmol/L (ref 98–111)
GFR calc Af Amer: >60 mL/min (ref >60)
GFR calc non Af Amer: >60 mL/min (ref >60)
Glucose, Bld: 125 mg/dL — ABNORMAL HIGH (ref 70–99)
Potassium: 3.7 mmol/L (ref 3.5–5.1)
Sodium: 142 mmol/L (ref 135–145)

## 2018-02-02 LAB — COMPREHENSIVE METABOLIC PANEL
ALBUMIN: 3.9 g/dL (ref 3.5–5.0)
ALK PHOS: 60 U/L (ref 38–126)
ALT: 35 U/L (ref 0–44)
ANION GAP: 5 (ref 5–15)
AST: 42 U/L — AB (ref 15–41)
BILIRUBIN TOTAL: 0.7 mg/dL (ref 0.3–1.2)
BUN: 21 mg/dL (ref 8–23)
CALCIUM: 9.4 mg/dL (ref 8.9–10.3)
CO2: 32 mmol/L (ref 22–32)
Chloride: 106 mmol/L (ref 98–111)
Creatinine, Ser: 0.72 mg/dL (ref 0.44–1.00)
GFR calc Af Amer: >60 mL/min (ref >60)
GFR calc non Af Amer: >60 mL/min (ref >60)
GLUCOSE: 112 mg/dL — AB (ref 70–99)
Potassium: 3.5 mmol/L (ref 3.5–5.1)
SODIUM: 143 mmol/L (ref 135–145)
TOTAL PROTEIN: 6.8 g/dL (ref 6.5–8.1)

## 2018-02-02 LAB — DIFFERENTIAL
BASOS ABS: 0 10*3/uL (ref 0.0–0.1)
Basophils Relative: 0 %
Eosinophils Absolute: 0.1 10*3/uL (ref 0.0–0.7)
Eosinophils Relative: 2 %
LYMPHS ABS: 1.9 10*3/uL (ref 0.7–4.0)
LYMPHS PCT: 32 %
Monocytes Absolute: 0.6 10*3/uL (ref 0.1–1.0)
Monocytes Relative: 10 %
NEUTROS ABS: 3.2 10*3/uL (ref 1.7–7.7)
NEUTROS PCT: 56 %

## 2018-02-02 LAB — POCT I-STAT, CHEM 8
BUN: 21 mg/dL (ref 8–23)
CREATININE: 0.8 mg/dL (ref 0.44–1.00)
Calcium, Ion: 1.25 mmol/L (ref 1.15–1.40)
Chloride: 102 mmol/L (ref 98–111)
GLUCOSE: 106 mg/dL — AB (ref 70–99)
HEMATOCRIT: 40 % (ref 36.0–46.0)
HEMOGLOBIN: 13.6 g/dL (ref 12.0–15.0)
POTASSIUM: 3.6 mmol/L (ref 3.5–5.1)
Sodium: 144 mmol/L (ref 135–145)
TCO2: 30 mmol/L (ref 22–32)

## 2018-02-02 LAB — GLUCOSE, CAPILLARY: Glucose-Capillary: 99 mg/dL (ref 70–99)

## 2018-02-02 LAB — PROTIME-INR
INR: 0.97
PROTHROMBIN TIME: 12.7 s (ref 11.4–15.2)

## 2018-02-02 LAB — ETHANOL: Alcohol, Ethyl (B): <10 mg/dL (ref <10)

## 2018-02-02 LAB — APTT: APTT: 25 s (ref 24–36)

## 2018-02-02 MED ORDER — SODIUM CHLORIDE 0.9 % IV SOLN
50.0000 mL | Freq: Once | INTRAVENOUS | Status: AC
  Administered 2018-02-02: 50 mL via INTRAVENOUS

## 2018-02-02 MED ORDER — LABETALOL HCL 5 MG/ML IV SOLN: 5.0000 mg | Freq: Once | INTRAVENOUS | Status: DC

## 2018-02-02 MED ORDER — ALTEPLASE (STROKE) FULL DOSE INFUSION
0.9000 mg/kg | Freq: Once | INTRAVENOUS | Status: AC
  Administered 2018-02-02: 45.3 mg via INTRAVENOUS
  Filled 2018-02-02: qty 100

## 2018-02-02 MED ORDER — IOPAMIDOL (ISOVUE-370) INJECTION 76%
150.0000 mL | Freq: Once | INTRAVENOUS | Status: AC | PRN
  Administered 2018-02-02: 150 mL via INTRAVENOUS

## 2018-02-02 MED ORDER — ALTEPLASE 100 MG IV SOLR
INTRAVENOUS | Status: AC
  Filled 2018-02-02: qty 100

## 2018-02-02 NOTE — ED Triage Notes (Signed)
Pt last normal from roommate at nursing home was around 2045. Pt is aphasic with left sided weakness and facial droop.

## 2018-02-02 NOTE — Consult Note (Addendum)
Date: 02/02/2018  [facility] Tele[facility]urology Consult Services  Impression: Stroke - L MCA stroke  Alteplase is indicated. No absolute contraindications are present.   ----*Presentation is suggestive of Large Vessel Occlusive Disease. Advanced imaging recommended.  CTA head/neck/P reviewed (radiologist's read still pending).  No LVO or perfusion abnormality seen per my read.  Therefore, if radiologist agrees, no thrombectomy recommended    Differential Diagnosis:  1. Cardioembolic stroke  2. Small vessel disease/lacune  3. Thromboembolic, artery-to-artery mechanism  4. Hypercoagulable state-related infarct  5. Transient ischemic attack  6. Thrombotic mechanism, large artery disease   Metrics:   Door time: 2117 [facility] contacted: 2122 [facility] at bedside: 2129 Last known well (LKW): 2045 NIHSS assessment time (start of exam): 2133 Verbal tpa order: 2152 Blood pressure prior to bolus:146/100 Blood Glucose:  99 Needle time: 2207  Verbal Consent to tPA:  I have explained to the patient/family/guardian the nature of the patient's condition, the use of tPA fibrinolytic agent, and the benefits to be reasonably expected compared with alternative approaches. I have discussed the likelihood of major risks or complications of this procedure including (if applicable) but not limited to loss of limb function, brain damage, paralysis, hemorrhage, infection, complications from transfusion of blood components, drug reactions, blood clots and loss of life. I have also indicated that with any procedure there is always the possibility of an unexpected complication. I have explained the risks which include:    1. Death, Stroke or permanent neurologic injury (paralysis, coma, etc)   2. Worsening of stroke symptoms from swelling or bleeding in the brain   3. Bleeding in other parts of the body   4. Need for blood transfusions to replace blood or clotting factors   5. Allergic reaction  to medications   6. Other unexpected complications    All questions were answered and the patient/family/guardian express understanding of the treatment plan and consent to the procedure.  Our recommendations are outlined below.   We will be seeing the patient back in follow up as noted.    Recommendations:   IV tPA - dose =  4.5 mg bolus, 40.8 mg infusion Routine post tPA monitoring including neuro checks and blood pressure control during/after treatment Monitor blood pressure Check blood pressure and NIHSS every 15 min for 2 h, then every 30 min for 6 h, and finally every hour for 16 h  Systolic greater than 180 OR diastolic greater than 105: Option 1: Labetalol 10 mg IV for 1 - 2 min May repeat or double labetalol every 10 min to maximum dose of 300 mg, or give initial labetalol dose, then start labetalol drip at 2 - 8 mg/min. Option 2: Nicardipine 5 mg/h IV infusion as initial dose and titrate to desired effect by increasing 2.5 mg/h every 5 min to maximum of 15 mg/h;  If blood pressure is not controlled by labetolol or nicardipine, consider sodium nitroprusside.  Admission to ICU CT brain 24 hours post tPA NPO until swallowing screen performed and passed No antiplatelet agents or anticoagulants (including heparin for DVT prophylaxis) in first 24 hours No Foley catheter, nasogastric tube, arterial catheter or central venous catheter for 24 hr, unless absolutely necessary Telemetry  Inpatient Neurology Consultation Stroke evaluation as per inpatient neurology recommendations Discussed with ED MD   ------------------------------------------------------------------------------  CC Difficulty speaking  History of Present Illness  Patient is a 77 year old woman with history of stroke (with residual left sided weakness but no language difficulties) presents with difficulty speaking.  According  to nursing some staff, her symptoms began at 8:45 PM and the onset was witnessed by her  roommate.  Her daughter states that her language difficulties fluctuate.  Some days she is fine and other days she had difficulty speaking like how she is presenting now.  Diagnostics CT head reviewed: no acute intracranial abnormality  Exam  Patient is in no apparent distress. Patient appears as stated age. No obvious acute respiratory or cardiac distress. Patient is well groomed and well-nourished.  1a- LOC: Keenly responsive - 0  1a- LOC: Keenly responsive - 0  1b- LOC questions: Answers both questions correctly - 2 1c- LOC commands- Performs both tasks correctly- 0  2- Gaze: Normal; no gaze paresis or gaze deviation - 0  3- Visual Fields: 0  4- Facial movements: 0  5- Upper limb motor - 0  6- Lower limb motor - 0  7- Limb Coordination: absent ataxia - 0  8- Sensory: 1 (decreased LLE) 9- Language - 1 (can name and read some sentences but cannot describe what is going on in the picture of the people) 10- Speech - 1 11- Neglect - none found - 0  NIHSS score: 5  Medical Decision Making:  - Extensive number of diagnosis or management options are considered above.   - Extensive amount of complex data reviewed.   - High risk of complication and/or morbidity or mortality are associated with differential diagnostic considerations above.  - There may be Uncertain outcome and increased probability of prolonged functional impairment or high probability of severe prolonged functional impairment associated with some of these differential diagnosis.  Medical Data Reviewed:  1.Data reviewed include clinical labs, radiology,  Medical Tests;   2.Tests results discussed w/performing or interpreting physician;   3.Obtaining/reviewing old medical records;  4.Obtaining case history from another source;  5.Independent review of image, tracing or specimen.    Patient was informed the Neurology Consult would happen via TeleHealth consult by way of interactive audio and video telecommunications and  consented to receiving care in this manner.

## 2018-02-02 NOTE — ED Notes (Signed)
TPA infusion started at 2208 hrs and finished at 2308 hrs.

## 2018-02-02 NOTE — ED Notes (Signed)
SPOK paged @ 2008

## 2018-02-03 ENCOUNTER — Inpatient Hospital Stay (HOSPITAL_COMMUNITY): Payer: Medicare Other

## 2018-02-03 DIAGNOSIS — E785 Hyperlipidemia, unspecified: Secondary | ICD-10-CM

## 2018-02-03 DIAGNOSIS — E43 Unspecified severe protein-calorie malnutrition: Secondary | ICD-10-CM

## 2018-02-03 DIAGNOSIS — I5030 Unspecified diastolic (congestive) heart failure: Secondary | ICD-10-CM

## 2018-02-03 DIAGNOSIS — I10 Essential (primary) hypertension: Secondary | ICD-10-CM

## 2018-02-03 LAB — LIPID PANEL
Cholesterol: 155 mg/dL (ref 0–200)
HDL: 61 mg/dL (ref >40)
LDL CALC: 89 mg/dL (ref 0–99)
Total CHOL/HDL Ratio: 2.5 RATIO
Triglycerides: 27 mg/dL (ref <150)
VLDL: 5 mg/dL (ref 0–40)

## 2018-02-03 LAB — MRSA PCR SCREENING: MRSA by PCR: NEGATIVE

## 2018-02-03 LAB — HEMOGLOBIN A1C
HEMOGLOBIN A1C: 5.2 % (ref 4.8–5.6)
Mean Plasma Glucose: 102.54 mg/dL

## 2018-02-03 LAB — ECHOCARDIOGRAM COMPLETE
HEIGHTINCHES: 62 in
WEIGHTICAEL: 1689.6054 [oz_av]

## 2018-02-03 LAB — GLUCOSE, CAPILLARY
GLUCOSE-CAPILLARY: 134 mg/dL — AB (ref 70–99)
Glucose-Capillary: 104 mg/dL — ABNORMAL HIGH (ref 70–99)
Glucose-Capillary: 82 mg/dL (ref 70–99)
Glucose-Capillary: 88 mg/dL (ref 70–99)

## 2018-02-03 LAB — VITAMIN D 25 HYDROXY (VIT D DEFICIENCY, FRACTURES): VIT D 25 HYDROXY: 24.2 ng/mL — AB (ref 30.0–100.0)

## 2018-02-03 MED ORDER — SODIUM CHLORIDE 0.9 % IV SOLN
INTRAVENOUS | Status: DC
  Administered 2018-02-03: 03:00:00 via INTRAVENOUS

## 2018-02-03 MED ORDER — VITAMIN D3 25 MCG (1000 UNIT) PO TABS
2000.0000 [IU] | ORAL_TABLET | Freq: Every morning | ORAL | Status: DC
  Administered 2018-02-04: 2000 [IU] via ORAL
  Filled 2018-02-03 (×2): qty 2

## 2018-02-03 MED ORDER — SERTRALINE HCL 50 MG PO TABS
25.0000 mg | ORAL_TABLET | Freq: Every morning | ORAL | Status: DC
  Administered 2018-02-04: 25 mg via ORAL
  Filled 2018-02-03: qty 1

## 2018-02-03 MED ORDER — CALCIUM CARBONATE 1250 (500 CA) MG PO TABS
1.0000 | ORAL_TABLET | Freq: Two times a day (BID) | ORAL | Status: DC
  Administered 2018-02-03 – 2018-02-04 (×2): 500 mg via ORAL
  Filled 2018-02-03 (×3): qty 1

## 2018-02-03 MED ORDER — DONEPEZIL HCL 5 MG PO TABS
5.0000 mg | ORAL_TABLET | Freq: Every day | ORAL | Status: DC
  Administered 2018-02-03: 5 mg via ORAL
  Filled 2018-02-03: qty 1

## 2018-02-03 MED ORDER — ORAL CARE MOUTH RINSE
15.0000 mL | Freq: Two times a day (BID) | OROMUCOSAL | Status: DC
  Administered 2018-02-03: 15 mL via OROMUCOSAL

## 2018-02-03 MED ORDER — ORAL CARE MOUTH RINSE: 15.0000 mL | Freq: Two times a day (BID) | OROMUCOSAL | Status: DC

## 2018-02-03 MED ORDER — ACETAMINOPHEN 650 MG RE SUPP: 650.0000 mg | RECTAL | Status: DC | PRN

## 2018-02-03 MED ORDER — STROKE: EARLY STAGES OF RECOVERY BOOK
Freq: Once | Status: AC
  Administered 2018-02-03: 03:00:00
  Filled 2018-02-03: qty 1

## 2018-02-03 MED ORDER — SENNOSIDES-DOCUSATE SODIUM 8.6-50 MG PO TABS: 1.0000 | ORAL_TABLET | Freq: Every evening | ORAL | Status: DC | PRN

## 2018-02-03 MED ORDER — MEMANTINE HCL 5 MG PO TABS
5.0000 mg | ORAL_TABLET | Freq: Two times a day (BID) | ORAL | Status: DC
  Administered 2018-02-03: 5 mg via ORAL
  Filled 2018-02-03 (×3): qty 1

## 2018-02-03 MED ORDER — SENNOSIDES-DOCUSATE SODIUM 8.6-50 MG PO TABS: 1.0000 | ORAL_TABLET | Freq: Two times a day (BID) | ORAL | Status: DC

## 2018-02-03 MED ORDER — MIRTAZAPINE 15 MG PO TABS
7.5000 mg | ORAL_TABLET | Freq: Every day | ORAL | Status: DC
  Administered 2018-02-03: 7.5 mg via ORAL
  Filled 2018-02-03: qty 1

## 2018-02-03 MED ORDER — ASPIRIN 81 MG PO CHEW
81.0000 mg | CHEWABLE_TABLET | Freq: Every day | ORAL | Status: DC
  Administered 2018-02-03 – 2018-02-04 (×2): 81 mg via ORAL
  Filled 2018-02-03 (×2): qty 1

## 2018-02-03 MED ORDER — ACETAMINOPHEN 160 MG/5ML PO SOLN: 650.0000 mg | ORAL | Status: DC | PRN

## 2018-02-03 MED ORDER — CHLORHEXIDINE GLUCONATE 0.12 % MT SOLN
15.0000 mL | Freq: Two times a day (BID) | OROMUCOSAL | Status: DC
  Administered 2018-02-03 (×2): 15 mL via OROMUCOSAL

## 2018-02-03 MED ORDER — ENSURE ENLIVE PO LIQD
237.0000 mL | Freq: Three times a day (TID) | ORAL | Status: DC
  Administered 2018-02-03 – 2018-02-04 (×3): 237 mL via ORAL

## 2018-02-03 MED ORDER — ACETAMINOPHEN 325 MG PO TABS: 650.0000 mg | ORAL_TABLET | ORAL | Status: DC | PRN

## 2018-02-03 NOTE — Procedures (Signed)
ELECTROENCEPHALOGRAM REPORT   Patient: Evelyn HutchingMildred Kemppainen V.       Room #: 4N23C EEG No. ID: [id number] Age: 77 y.o.        Sex: female Referring Physician: Roda ShuttersXu Report Date:  02/03/2018        Interpreting Physician: Thana FarrEYNOLDS, Alysa Duca  History: Evelyn HutchingMildred Proudfoot V. is an 77 y.o. female with suspected stroke s/p tPA  Medications:  Zoloft, Remeron, Labetalol, Aricept, Vitamin D, Os-cal, Namenda  Conditions of Recording:  This is a 21 channel routine scalp EEG performed with bipolar and monopolar montages arranged in accordance to the international 10/20 system of electrode placement. One channel was dedicated to EKG recording.  The patient is in the awake state.  Description:  The waking background activity consists of a low voltage, symmetrical, fairly well organized, 10 Hz alpha activity, seen from the parieto-occipital and posterior temporal regions.  Low voltage fast activity, poorly organized, is seen anteriorly and is at times superimposed on more posterior regions.  A mixture of theta and alpha rhythms are seen from the central and temporal regions. The patient does not drowse or sleep. No epileptiform activity is noted.    Hyperventilation and intermittent photic stimulation were not performed.  IMPRESSION: This is a normal awake electroencephalogram. There are no focal lateralizing or epileptiform features.   Comment:  An EEG with the patient sleep deprived to elicit drowse and light sleep may be desirable to further elicit a possible seizure disorder.     Thana FarrLeslie Jordie Schreur, MD Neurology [phone] 02/03/2018, 7:58 PM

## 2018-02-03 NOTE — Progress Notes (Signed)
EEG Completed; Results Pending  

## 2018-02-03 NOTE — Progress Notes (Signed)
PT Cancellation Note  Patient Details Name: Evelyn HutchingMildred Lonon V. MRN: [id number] DOB: 10/07/1940   Cancelled Treatment:    Reason Eval/Treat Not Completed: Patient not medically ready;Active bedrest order.  02/03/2018  [place] BingKen [name], PT [phone]  (pager)   Eliseo GumKenneth V Malya Cirillo 02/03/2018, 10:19 AM

## 2018-02-03 NOTE — Plan of Care (Signed)
Patient admitted 02/03/18 for CVA. Received TPA at 2208. Patient with improvement in left side weakness, but still aphasic and with slurred speech. Will continue to monitor.

## 2018-02-03 NOTE — Evaluation (Signed)
Clinical/Bedside Swallow Evaluation Patient Details  Name: Evelyn Flowers. MRN: [id number] Date of Birth: [birth year]  Today's Date: 02/03/2018 Time: SLP Start Time (ACUTE ONLY): 1015 SLP Stop Time (ACUTE ONLY): 1045 SLP Time Calculation (min) (ACUTE ONLY): 30 min  Past Medical History:  Past Medical History:  Diagnosis Date  . Acute encephalopathy 2012   In context of diarrhea and clinical dehydration  . Dementia    Noncompliant with Aricept as per daughter  . Protein-calorie malnutrition, severe (HCC)   . Stroke (HCC)   . Underweight   . Vertigo    Past Surgical History:  Past Surgical History:  Procedure Laterality Date  . COMPRESSION HIP SCREW Right 09/21/2015   Procedure: COMPRESSION HIP;  Surgeon: Vickki HearingStanley E Harrison, MD;  Location: [facility];  Service: Orthopedics;  Laterality: Right;   HPI:  77 year old female admitted 02/02/18 with aphasia and left sided weakness. PMH: dysarthria, acute encephalopathy, lightheadedness, malnutrition, dehydration, diarrhea. Daughter reports gradual decline in speech and language skills, raising suspicion for Primary Progressive Aphasia.    Assessment / Plan / Recommendation Clinical Impression  Pt seen at bedside to assess swallow function and identify least restrictive diet. Oral care was completed with suction. Pt has adequate dentition, but is missing some teeth. Daughter, present, reports pt has been on a puree diet and nectar thick liquids at her SNF, but appears to tolerate soft solids and thin liquids that she provides to her mother. Pt was given trials of ice chips, thin liquid, puree, and solid consistencies. Extended oral prep of dry solid, with slight oral residue after the swallow. Liquid wash or puree effeectively cleared oral residue. Soft solid was tolerated better, with no oral residue noted. No overt s/s aspiration observed on any consistency. Will begin dys 2 diet and thin liquids with 1:1 supervision and crushed meds recommended.  SLP will follow for diet tolerance and education. Recommend follow up with SNF at next level of care for education. Daughter, RN, and MD were informed of results and recommendations. [facility]     SLP Visit Diagnosis: Dysphagia, unspecified (R13.10)    Aspiration Risk  Mild aspiration risk;Moderate aspiration risk    Diet Recommendation Dysphagia 2 (Fine chop);Thin liquid   Liquid Administration via: Straw Medication Administration: Crushed with puree Supervision: Full supervision/cueing for compensatory strategies;Staff to assist with self feeding Compensations: Minimize environmental distractions;Slow rate;Small sips/bites;Lingual sweep for clearance of pocketing Postural Changes: Seated upright at 90 degrees;Remain upright for at least 30 minutes after po intake    Other  Recommendations Oral Care Recommendations: Oral care QID Other Recommendations: Have oral suction available   Follow up Recommendations Skilled Nursing facility      Frequency and Duration min 2x/week  2 weeks       Prognosis Prognosis for Safe Diet Advancement: Fair Barriers to Reach Goals: Cognitive deficits      Swallow Study   General Date of Onset: 02/02/18 HPI: 77 year old female admitted 02/02/18 with aphasia and left sided weakness. PMH: dysarthria, acute encephalopathy, lightheadedness, malnutrition, dehydration, diarrhea. Daughter reports gradual decline in speech and language skills, raising suspicion for Primary Progressive Aphasia.  Type of Study: Bedside Swallow Evaluation Previous Swallow Assessment: BSE April 2017 - recommended Dys3/thin Diet Prior to this Study: NPO Temperature Spikes Noted: No Respiratory Status: Room air History of Recent Intubation: No Behavior/Cognition: Alert;Confused;Requires cueing;Pleasant mood;Cooperative Oral Cavity Assessment: Within Functional Limits Oral Care Completed by SLP: Yes Oral Cavity - Dentition: Missing  dentition Vision:  Functional for self-feeding Self-Feeding Abilities: Total assist Patient Positioning: Upright in bed Baseline Vocal Quality: Low vocal intensity Volitional Cough: Weak Volitional Swallow: Able to elicit    Oral/Motor/Sensory Function Overall Oral Motor/Sensory Function: Severe impairment(generalized weakness and discoordination)   Ice Chips Ice chips: Within functional limits Presentation: Spoon   Thin Liquid Thin Liquid: Within functional limits Presentation: Straw Pharyngeal  Phase Impairments: Suspected delayed Swallow    Nectar Thick Nectar Thick Liquid: Not tested   Honey Thick Honey Thick Liquid: Not tested   Puree Puree: Within functional limits Presentation: Spoon   Solid     Solid: Impaired Oral Phase Impairments: Impaired mastication Oral Phase Functional Implications: Impaired mastication Pharyngeal Phase Impairments: Suspected delayed Swallow Other Comments: improved oral prep with soft solid.     Shawntia Mangal B. Murvin NatalBueche, [facility] IncMSP, CCC-SLP Speech Language Pathologist [phone]  [name], Tanette Chauca Brown 02/03/2018,12:09 PM

## 2018-02-03 NOTE — Progress Notes (Signed)
  Echocardiogram 2D Echocardiogram has been performed.  Evelyn Flowers 02/03/2018, 4:48 PM

## 2018-02-03 NOTE — Progress Notes (Signed)
Code stroke  Call Time  I called after beeper.  [facility] in route Beeper   2104 Exam started  2115 Exam Finished 2122 [facility]   2122 Complete in epic 2124 Rad called  2124

## 2018-02-03 NOTE — Progress Notes (Signed)
OT Cancellation Note  Patient Details Name: Evelyn HutchingMildred Pesqueira V. MRN: [id number] DOB: [birth year]   Cancelled Treatment:    Reason Eval/Treat Not Completed: Active bedrest order(TPA within 24 hour window)  Evern BioLaura J Taniya Dasher 02/03/2018, 10:21 AM  Sherryl MangesLaura Waverly Chavarria OTR/L [phone]

## 2018-02-03 NOTE — ED Provider Notes (Signed)
[facility] EMERGENCY DEPARTMENT Provider Note   CSN: [id number] Arrival date & time: 02/02/18  2117   An emergency department physician performed an initial assessment on this suspected stroke patient at 2116.  History   Chief Complaint Chief Complaint  Patient presents with  . Code Stroke    HPI Evelyn Flowers. is a 77 y.o. female.  Level 5 caveat for initiation of code stroke.  Hx obtained from EMS notes.  Nursing home reports increased slurred speech, aphasia, left-sided weakness, left facial droop, confusion at 2045.  On arrival to the emergency department, a code stroke was initiated.     Past Medical History:  Diagnosis Date  . Acute encephalopathy 2012   In context of diarrhea and clinical dehydration  . Dementia    Noncompliant with Aricept as per daughter  . Protein-calorie malnutrition, severe (HCC)   . Stroke (HCC)   . Underweight   . Vertigo     Patient Active Problem List   Diagnosis Date Noted  . CVA (cerebral vascular accident) (HCC) 02/02/2018  . Failure to thrive in adult 10/14/2017  . Hypernatremia 07/28/2017  . Recurrent falls 07/28/2017  . Apnea spell 06/16/2017  . DNR (do not resuscitate) 06/16/2017  . Osteoporosis 02/18/2017  . Depression, recurrent (HCC) 10/04/2016  . Vitamin D deficiency 08/24/2016  . Dementia 09/26/2015  . Closed intertrochanteric fracture of right femur (HCC) 09/19/2015  . UTI (lower urinary tract infection) 09/19/2015  . Acute encephalopathy 06/09/2015    Past Surgical History:  Procedure Laterality Date  . COMPRESSION HIP SCREW Right 09/21/2015   Procedure: COMPRESSION HIP;  Surgeon: Vickki Hearing, MD;  Location: [facility];  Service: Orthopedics;  Laterality: Right;     OB History   None      Home Medications    Prior to Admission medications   Medication Sig Start Date End Date Taking? Authorizing Provider  acetaminophen (TYLENOL) 325 MG tablet Take 2 tablets (650 mg total) by mouth every 6 (six)  hours as needed for mild pain (or Fever >/= 101). 06/11/15  Yes Alison Murray, MD  aspirin EC 325 MG EC tablet Take 1 tablet (325 mg total) by mouth daily with breakfast. 09/24/15  Yes Elliot Cousin, MD  calcium carbonate (OS-CAL - DOSED IN MG OF ELEMENTAL CALCIUM) 1250 (500 Ca) MG tablet Take 1 tablet by mouth 2 (two) times daily with a meal.   Yes [provider]  Cholecalciferol 1000 units tablet Take 2,000 Units by mouth every morning.    Yes [provider]  donepezil (ARICEPT) 5 MG tablet Take 5 mg by mouth at bedtime.   Yes [provider]  memantine (NAMENDA) 5 MG tablet Take 5 mg by mouth 2 (two) times daily.   Yes [provider]  mirtazapine (REMERON) 15 MG tablet Take 7.5 mg by mouth at bedtime.    Yes [provider]  senna-docusate (SENOKOT-S) 8.6-50 MG tablet Take 1 tablet by mouth 2 (two) times daily. 09/24/15  Yes Elliot Cousin, MD  sertraline (ZOLOFT) 25 MG tablet Take 25 mg by mouth every morning.    Yes [provider]  triamcinolone (KENALOG) 0.025 % cream Apply 1 application topically 2 (two) times daily as needed (for itching/rash).    Yes [provider]    Family History Family History  Problem Relation Age of Onset  . Dementia Mother   . Cancer Paternal Grandmother        of upper extremity  . Heart disease  Neg Hx   . Diabetes Neg Hx   . Stroke Neg Hx     Social History Social History   Tobacco Use  . Smoking status: Never Smoker  . Smokeless tobacco: Never Used  Substance Use Topics  . Alcohol use: Yes    Comment: occasionally  . Drug use: No     Allergies   Beef-derived products   Review of Systems Review of Systems  Unable to perform ROS: Acuity of condition     Physical Exam Updated Vital Signs BP (!) 146/82   Pulse 68   Temp 98.2 F (36.8 C) (Oral)   Resp 19   Wt 50.3 kg   SpO2 97%   BMI 20.28 kg/m   Physical Exam  Constitutional:  Left facial droop, unable to  follow commands  HENT:  Head: Normocephalic and atraumatic.  Eyes: Conjunctivae are normal.  Neck: Neck supple.  Cardiovascular: Normal rate and regular rhythm.  Pulmonary/Chest: Effort normal and breath sounds normal.  Abdominal: Soft. Bowel sounds are normal.  Musculoskeletal: Normal range of motion.  Neurological:  Aphasic  Skin: Skin is warm and dry.  Psychiatric:  unable  Nursing note and vitals reviewed.    ED Treatments / Results  Labs (all labs ordered are listed, but only abnormal results are displayed) Labs Reviewed  COMPREHENSIVE METABOLIC PANEL - Abnormal; Notable for the following components:      Result Value   Glucose, Bld 112 (*)    AST 42 (*)    All other components within normal limits  POCT I-STAT, CHEM 8 - Abnormal; Notable for the following components:   Glucose, Bld 106 (*)    All other components within normal limits  ETHANOL  PROTIME-INR  APTT  CBC  DIFFERENTIAL  GLUCOSE, CAPILLARY  RAPID URINE DRUG SCREEN, HOSP PERFORMED  URINALYSIS, ROUTINE W REFLEX MICROSCOPIC  I-STAT CHEM 8, ED  I-STAT TROPONIN, ED    EKG None  Radiology Ct Angio Head W Or Wo Contrast  Result Date: 02/02/2018 CLINICAL DATA:  Aphasic, LEFT sided weakness and facial droop. History of stroke, dementia and vertigo. EXAM: CT ANGIOGRAPHY HEAD AND NECK CT PERFUSION BRAIN TECHNIQUE: Multidetector CT imaging of the head and neck was performed using the standard protocol during bolus administration of intravenous contrast. Multiplanar CT image reconstructions and MIPs were obtained to evaluate the vascular anatomy. Carotid stenosis measurements (when applicable) are obtained utilizing NASCET criteria, using the distal internal carotid diameter as the denominator. Multiphase CT imaging of the brain was performed following IV bolus contrast injection. Subsequent parametric perfusion maps were calculated using RAPID software. CONTRAST:  150mL ISOVUE-370 IOPAMIDOL (ISOVUE-370) INJECTION  76% COMPARISON:  CT HEAD February 02, 2018 and MRI head June 09, 2015 FINDINGS: CTA NECK FINDINGS: AORTIC ARCH: Normal appearance of the thoracic arch, normal branch pattern. Mild calcific atherosclerosis. The origins of the innominate, left Common carotid artery and subclavian artery are widely patent. RIGHT CAROTID SYSTEM: Common carotid artery is patent. Normal appearance of the carotid bifurcation without hemodynamically significant stenosis by NASCET criteria. Normal appearance of the internal carotid artery. LEFT CAROTID SYSTEM: Common carotid artery is patent. Normal appearance of the carotid bifurcation without hemodynamically significant stenosis by NASCET criteria. Normal appearance of the internal carotid artery. VERTEBRAL ARTERIES:Left vertebral artery is dominant. Normal appearance of the vertebral arteries, widely patent. SKELETON: No acute osseous process though bone windows have not been submitted. Mild old upper thoracic compression fractures. Osteopenia. OTHER NECK: Soft tissues of the neck are nonacute though, not  tailored for evaluation. Minimal intravenous subcutaneous gas consistent with recent intravascular access UPPER CHEST: Included lung apices are clear. No superior mediastinal lymphadenopathy. CTA HEAD FINDINGS: ANTERIOR CIRCULATION: Patent cervical internal carotid arteries, petrous, cavernous and supra clinoid internal carotid arteries. Patent anterior communicating artery. Patent anterior and middle cerebral arteries. No large vessel occlusion, significant stenosis, contrast extravasation or aneurysm. POSTERIOR CIRCULATION: Patent vertebral arteries, vertebrobasilar junction and basilar artery, as well as main branch vessels. Blister aneurysm LEFT V4 segment. Patent posterior cerebral arteries. Small bilateral posterior communicating arteries present. No large vessel occlusion, significant stenosis, contrast extravasation or aneurysm. VENOUS SINUSES: Major dural venous sinuses are  patent though not tailored for evaluation on this angiographic examination. ANATOMIC VARIANTS: Hypoplastic RIGHT A1 segment. DELAYED PHASE: Not performed. Review of the MIP images confirms the above findings CT Brain Perfusion Findings (mild motion degraded examination): CBF (<30%) Volume: 0mL Perfusion (Tmax>6.0s) volume: 0mL Mismatch Volume: 0mL Infarction Location:None. IMPRESSION: CTA NECK: 1. No hemodynamically significant stenosis ICA's. No acute vascular process. 2. Patent vertebral arteries. CTA HEAD: 1. No emergent large vessel occlusion or flow-limiting stenosis. Complete circle-of-Willis. CT PERFUSION: 1. No perfusion abnormality on this mildly motion degraded examination. Aortic Atherosclerosis (ICD10-I70.0). Electronically Signed   By: Awilda Metro M.D.   On: 02/02/2018 23:02   Ct Angio Neck W Or Wo Contrast  Result Date: 02/02/2018 CLINICAL DATA:  Aphasic, LEFT sided weakness and facial droop. History of stroke, dementia and vertigo. EXAM: CT ANGIOGRAPHY HEAD AND NECK CT PERFUSION BRAIN TECHNIQUE: Multidetector CT imaging of the head and neck was performed using the standard protocol during bolus administration of intravenous contrast. Multiplanar CT image reconstructions and MIPs were obtained to evaluate the vascular anatomy. Carotid stenosis measurements (when applicable) are obtained utilizing NASCET criteria, using the distal internal carotid diameter as the denominator. Multiphase CT imaging of the brain was performed following IV bolus contrast injection. Subsequent parametric perfusion maps were calculated using RAPID software. CONTRAST:  ISOVUE-370 IOPAMIDOL (ISOVUE-370) INJECTION 76% COMPARISON:  CT HEAD February 02, 2018 and MRI head June 09, 2015 FINDINGS: CTA NECK FINDINGS: AORTIC ARCH: Normal appearance of the thoracic arch, normal branch pattern. Mild calcific atherosclerosis. The origins of the innominate, left Common carotid artery and subclavian artery are widely  patent. RIGHT CAROTID SYSTEM: Common carotid artery is patent. Normal appearance of the carotid bifurcation without hemodynamically significant stenosis by NASCET criteria. Normal appearance of the internal carotid artery. LEFT CAROTID SYSTEM: Common carotid artery is patent. Normal appearance of the carotid bifurcation without hemodynamically significant stenosis by NASCET criteria. Normal appearance of the internal carotid artery. VERTEBRAL ARTERIES:Left vertebral artery is dominant. Normal appearance of the vertebral arteries, widely patent. SKELETON: No acute osseous process though bone windows have not been submitted. Mild old upper thoracic compression fractures. Osteopenia. OTHER NECK: Soft tissues of the neck are nonacute though, not tailored for evaluation. Minimal intravenous subcutaneous gas consistent with recent intravascular access UPPER CHEST: Included lung apices are clear. No superior mediastinal lymphadenopathy. CTA HEAD FINDINGS: ANTERIOR CIRCULATION: Patent cervical internal carotid arteries, petrous, cavernous and supra clinoid internal carotid arteries. Patent anterior communicating artery. Patent anterior and middle cerebral arteries. No large vessel occlusion, significant stenosis, contrast extravasation or aneurysm. POSTERIOR CIRCULATION: Patent vertebral arteries, vertebrobasilar junction and basilar artery, as well as main branch vessels. Blister aneurysm LEFT V4 segment. Patent posterior cerebral arteries. Small bilateral posterior communicating arteries present. No large vessel occlusion, significant stenosis, contrast extravasation or aneurysm. VENOUS SINUSES: Major dural venous sinuses are patent though not tailored  for evaluation on this angiographic examination. ANATOMIC VARIANTS: Hypoplastic RIGHT A1 segment. DELAYED PHASE: Not performed. Review of the MIP images confirms the above findings CT Brain Perfusion Findings (mild motion degraded examination): CBF (<30%) Volume: 0mL  Perfusion (Tmax>6.0s) volume: 0mL Mismatch Volume: 0mL Infarction Location:None. IMPRESSION: CTA NECK: 1. No hemodynamically significant stenosis ICA's. No acute vascular process. 2. Patent vertebral arteries. CTA HEAD: 1. No emergent large vessel occlusion or flow-limiting stenosis. Complete circle-of-Willis. CT PERFUSION: 1. No perfusion abnormality on this mildly motion degraded examination. Aortic Atherosclerosis (ICD10-I70.0). Electronically Signed   By: Awilda Metroourtnay  Bloomer M.D.   On: 02/02/2018 23:02   Ct Cerebral Perfusion W Contrast  Result Date: 02/02/2018 CLINICAL DATA:  Aphasic, LEFT sided weakness and facial droop. History of stroke, dementia and vertigo. EXAM: CT ANGIOGRAPHY HEAD AND NECK CT PERFUSION BRAIN TECHNIQUE: Multidetector CT imaging of the head and neck was performed using the standard protocol during bolus administration of intravenous contrast. Multiplanar CT image reconstructions and MIPs were obtained to evaluate the vascular anatomy. Carotid stenosis measurements (when applicable) are obtained utilizing NASCET criteria, using the distal internal carotid diameter as the denominator. Multiphase CT imaging of the brain was performed following IV bolus contrast injection. Subsequent parametric perfusion maps were calculated using RAPID software. CONTRAST:  150mL ISOVUE-370 IOPAMIDOL (ISOVUE-370) INJECTION 76% COMPARISON:  CT HEAD February 02, 2018 and MRI head June 09, 2015 FINDINGS: CTA NECK FINDINGS: AORTIC ARCH: Normal appearance of the thoracic arch, normal branch pattern. Mild calcific atherosclerosis. The origins of the innominate, left Common carotid artery and subclavian artery are widely patent. RIGHT CAROTID SYSTEM: Common carotid artery is patent. Normal appearance of the carotid bifurcation without hemodynamically significant stenosis by NASCET criteria. Normal appearance of the internal carotid artery. LEFT CAROTID SYSTEM: Common carotid artery is patent. Normal appearance of  the carotid bifurcation without hemodynamically significant stenosis by NASCET criteria. Normal appearance of the internal carotid artery. VERTEBRAL ARTERIES:Left vertebral artery is dominant. Normal appearance of the vertebral arteries, widely patent. SKELETON: No acute osseous process though bone windows have not been submitted. Mild old upper thoracic compression fractures. Osteopenia. OTHER NECK: Soft tissues of the neck are nonacute though, not tailored for evaluation. Minimal intravenous subcutaneous gas consistent with recent intravascular access UPPER CHEST: Included lung apices are clear. No superior mediastinal lymphadenopathy. CTA HEAD FINDINGS: ANTERIOR CIRCULATION: Patent cervical internal carotid arteries, petrous, cavernous and supra clinoid internal carotid arteries. Patent anterior communicating artery. Patent anterior and middle cerebral arteries. No large vessel occlusion, significant stenosis, contrast extravasation or aneurysm. POSTERIOR CIRCULATION: Patent vertebral arteries, vertebrobasilar junction and basilar artery, as well as main branch vessels. Blister aneurysm LEFT V4 segment. Patent posterior cerebral arteries. Small bilateral posterior communicating arteries present. No large vessel occlusion, significant stenosis, contrast extravasation or aneurysm. VENOUS SINUSES: Major dural venous sinuses are patent though not tailored for evaluation on this angiographic examination. ANATOMIC VARIANTS: Hypoplastic RIGHT A1 segment. DELAYED PHASE: Not performed. Review of the MIP images confirms the above findings CT Brain Perfusion Findings (mild motion degraded examination): CBF (<30%) Volume: 0mL Perfusion (Tmax>6.0s) volume: 0mL Mismatch Volume: 0mL Infarction Location:None. IMPRESSION: CTA NECK: 1. No hemodynamically significant stenosis ICA's. No acute vascular process. 2. Patent vertebral arteries. CTA HEAD: 1. No emergent large vessel occlusion or flow-limiting stenosis. Complete  circle-of-Willis. CT PERFUSION: 1. No perfusion abnormality on this mildly motion degraded examination. Aortic Atherosclerosis (ICD10-I70.0). Electronically Signed   By: Awilda Metroourtnay  Bloomer M.D.   On: 02/02/2018 23:02   Ct Head Code Stroke Wo Contrast  Result Date: 02/02/2018 CLINICAL DATA:  Code stroke. Initial evaluation for acute aphasia, left-sided weakness and facial droop. EXAM: CT HEAD WITHOUT CONTRAST TECHNIQUE: Contiguous axial images were obtained from the base of the skull through the vertex without intravenous contrast. COMPARISON:  Prior MRI from 06/09/2015. FINDINGS: Brain: Advanced age-related cerebral atrophy. Moderate chronic small vessel ischemic disease. No acute intracranial hemorrhage. No acute large vessel territory infarct. No mass lesion, midline shift or mass effect. No hydrocephalus. No extra-axial fluid collection. Vascular: No hyperdense vessel. Calcified atherosclerosis at the skull base. Skull: Scalp soft tissues and calvarium demonstrate no acute abnormality. Sinuses/Orbits: Globes and orbital soft tissues demonstrate no acute abnormality. Paranasal sinuses and mastoid air cells are clear. Other: None. ASPECTS [facility] Stroke Program Early CT Score) - Ganglionic level infarction (caudate, lentiform nuclei, internal capsule, insula, M1-M3 cortex): 7 - Supraganglionic infarction (M4-M6 cortex): 3 Total score (0-10 with 10 being normal): 10 IMPRESSION: 1. No acute intracranial infarct or other abnormality identified. 2. ASPECTS is 10. 3. Advanced age-related cerebral atrophy with moderate chronic small vessel ischemic disease. Critical Value/emergent results were called by telephone at the time of interpretation on 02/02/2018 at 9:36 pm to Dr. Donnetta Hutching , who verbally acknowledged these results. Electronically Signed   By: Rise Mu M.D.   On: 02/02/2018 21:37    Procedures Procedures (including critical care time)  Medications Ordered in ED Medications  labetalol  (NORMODYNE,TRANDATE) injection 5 mg (has no administration in time range)  alteplase (ACTIVASE) 1 mg/mL infusion 45.3 mg (0 mg Intravenous Stopped 02/02/18 2208)    Followed by  0.9 %  sodium chloride infusion (0 mLs Intravenous Stopped 02/02/18 2305)  iopamidol (ISOVUE-370) 76 % injection 150 mL (150 mLs Intravenous Contrast Given 02/02/18 2218)     Initial Impression / Assessment and Plan / ED Course  I have reviewed the triage vital signs and the nursing notes.  Pertinent labs & imaging results that were available during my care of the patient were reviewed by me and considered in my medical decision making (see chart for details).     Code stroke initiated.  Tele-neurologist recommended TPA.  This was administered at 2208 with the consent of the daughter.  On further examination at approximately 2300, the patient was able to follow direct commands by moving all 4 extremities.  She still had difficulty speaking.  Discussed with tele-neurologist at [facility].  Will transfer.  CRITICAL CARE Performed by: Donnetta Hutching Total critical care time: 40 minutes Critical care time was exclusive of separately billable procedures and treating other patients. Critical care was necessary to treat or prevent imminent or life-threatening deterioration. Critical care was time spent personally by me on the following activities: development of treatment plan with patient and/or surrogate as well as nursing, discussions with consultants, evaluation of patient's response to treatment, examination of patient, obtaining history from patient or surrogate, ordering and performing treatments and interventions, ordering and review of laboratory studies, ordering and review of radiographic studies, pulse oximetry and re-evaluation of patient's condition.   Final Clinical Impressions(s) / ED Diagnoses   Final diagnoses:  Cerebrovascular accident (CVA), unspecified mechanism [facility])    ED Discharge Orders    None         Donnetta Hutching, MD 02/03/18 [phone]

## 2018-02-03 NOTE — H&P (Signed)
Chief Complaint:  Aphasia  History obtained from: Patient and Chart    HPI:                                                                                                                                       Evelyn HutchingMildred Rash V. is an 77 y.o. female past medical history of dementia, malnutrition, prior stroke who [facility] sudden onset aph[facility].  Obtained by chart review, patient is aphasic and unable to get family over the phone.  Last known normal appears to be 8:45 PM when her roommate noticed patient had difficulty with getting words out. She was evaluated by tele-neurology who scored her NIH stroke scale of 5 and administer TPA after negative head CT. CT perfusion scan was performed which showed no perfusion abnormality and CTA showed no LVO/significant stenosis.   Date last known well: 8.45 pm Time last known well: 8.14.19 tPA Given: yes NIHSS: 8 Baseline MRS 3-4    Past Medical History:  Diagnosis Date  . Acute encephalopathy 2012   In context of diarrhea and clinical dehydration  . Dementia    Noncompliant with Aricept as per daughter  . Protein-calorie malnutrition, severe (HCC)   . Stroke (HCC)   . Underweight   . Vertigo     Past Surgical History:  Procedure Laterality Date  . COMPRESSION HIP SCREW Right 09/21/2015   Procedure: COMPRESSION HIP;  Surgeon: Vickki HearingStanley E Harrison, MD;  Location: [facility];  Service: Orthopedics;  Laterality: Right;    Family History  Problem Relation Age of Onset  . Dementia Mother   . Cancer Paternal Grandmother        of upper extremity  . Heart disease Neg Hx   . Diabetes Neg Hx   . Stroke Neg Hx    Social History:  reports that she has never smoked. She has never used smokeless tobacco. She reports that she drinks alcohol. She reports that she does not use drugs.  Allergies:  Allergies  Allergen Reactions  . Beef-Derived Products     Unknown reaction-Listed on MAR    Medications:                                                                                                                         I reviewed home medications   ROS:  14 systems reviewed and negative except above    Examination:                                                                                                      General: Appears well-developed and well-nourished.  Psych: Affect appropriate to situation Eyes: No scleral injection HENT: No OP obstrucion Head: Normocephalic.  Cardiovascular: Normal rate and regular rhythm.  Respiratory: Effort normal and breath sounds normal to anterior ascultation GI: Soft.  No distension. There is no tenderness.  Skin: WDI    Neurological Examination Mental Status: Alert, mostly nonverbal but answers some questions with yes/no.  Stated left or right assessing sensation.  S Able to follow simple commands. Cranial Nerves: II: Visual fields grossly normal) ,  III,IV, VI: ptosis not present, extra-ocular motions intact bilaterally, pupils equal, round, reactive to light and accommodation V,VII: smile symmetric, facial light touch sensation normal bilaterally VIII: hearing normal bilaterally IX,X: uvula rises symmetrically XI: bilateral shoulder shrug XII: midline tongue extension Motor: Right : Upper extremity   5/5    Left:     Upper extremity   5/5  Lower extremity   5/5     Lower extremity   5/5 Tone and bulk:normal tone throughout; no atrophy noted Sensory: Pinprick and light touch intact throughout, bilaterally Deep Tendon Reflexes: 2+ and symmetric throughout Plantars: Right: downgoing   Left: downgoing Cerebellar: normal finger-to-nose, normal rapid alternating movements and normal heel-to-shin test Gait: normal gait and station     Lab Results: Basic Metabolic Panel: Recent Labs  Lab 02/02/18 0700 02/02/18 10/31/29  02/02/18 Nov 01, 2134  NA 142 143 144  K 3.7 3.5 3.6  CL 105 106 102  CO2 29 32  --   GLUCOSE 125* 112* 106*  BUN 17 21 21   CREATININE 0.72 0.72 0.80  CALCIUM 9.0 9.4  --     CBC: Recent Labs  Lab 02/02/18 0700 02/02/18 10-31-2129 02/02/18 01-Nov-2134  WBC 5.3 5.8  --   NEUTROABS 3.3 3.2  --   HGB 13.6 13.2 13.6  HCT 41.6 40.9 40.0  MCV 97.2 96.5  --   PLT 164 164  --     Coagulation Studies: Recent Labs    02/02/18 10/31/2129  LABPROT 12.7  INR 0.97    Imaging: Ct Angio Head W Or Wo Contrast  Result Date: 02/02/2018 CLINICAL DATA:  Aphasic, LEFT sided weakness and facial droop. History of stroke, dementia and vertigo. EXAM: CT ANGIOGRAPHY HEAD AND NECK CT PERFUSION BRAIN TECHNIQUE: Multidetector CT imaging of the head and neck was performed using the standard protocol during bolus administration of intravenous contrast. Multiplanar CT image reconstructions and MIPs were obtained to evaluate the vascular anatomy. Carotid stenosis measurements (when applicable) are obtained utilizing NASCET criteria, using the distal internal carotid diameter as the denominator. Multiphase CT imaging of the brain was performed following IV bolus contrast injection. Subsequent parametric perfusion maps were calculated using RAPID software. CONTRAST:  ISOVUE-370 IOPAMIDOL (ISOVUE-370) INJECTION 76% COMPARISON:  CT HEAD February 02, 2018 and MRI head June 09, 2015 FINDINGS: CTA NECK FINDINGS:  AORTIC ARCH: Normal appearance of the thoracic arch, normal branch pattern. Mild calcific atherosclerosis. The origins of the innominate, left Common carotid artery and subclavian artery are widely patent. RIGHT CAROTID SYSTEM: Common carotid artery is patent. Normal appearance of the carotid bifurcation without hemodynamically significant stenosis by NASCET criteria. Normal appearance of the internal carotid artery. LEFT CAROTID SYSTEM: Common carotid artery is patent. Normal appearance of the carotid bifurcation without  hemodynamically significant stenosis by NASCET criteria. Normal appearance of the internal carotid artery. VERTEBRAL ARTERIES:Left vertebral artery is dominant. Normal appearance of the vertebral arteries, widely patent. SKELETON: No acute osseous process though bone windows have not been submitted. Mild old upper thoracic compression fractures. Osteopenia. OTHER NECK: Soft tissues of the neck are nonacute though, not tailored for evaluation. Minimal intravenous subcutaneous gas consistent with recent intravascular access UPPER CHEST: Included lung apices are clear. No superior mediastinal lymphadenopathy. CTA HEAD FINDINGS: ANTERIOR CIRCULATION: Patent cervical internal carotid arteries, petrous, cavernous and supra clinoid internal carotid arteries. Patent anterior communicating artery. Patent anterior and middle cerebral arteries. No large vessel occlusion, significant stenosis, contrast extravasation or aneurysm. POSTERIOR CIRCULATION: Patent vertebral arteries, vertebrobasilar junction and basilar artery, as well as main branch vessels. Blister aneurysm LEFT V4 segment. Patent posterior cerebral arteries. Small bilateral posterior communicating arteries present. No large vessel occlusion, significant stenosis, contrast extravasation or aneurysm. VENOUS SINUSES: Major dural venous sinuses are patent though not tailored for evaluation on this angiographic examination. ANATOMIC VARIANTS: Hypoplastic RIGHT A1 segment. DELAYED PHASE: Not performed. Review of the MIP images confirms the above findings CT Brain Perfusion Findings (mild motion degraded examination): CBF (<30%) Volume: 0mL Perfusion (Tmax>6.0s) volume: 0mL Mismatch Volume: 0mL Infarction Location:None. IMPRESSION: CTA NECK: 1. No hemodynamically significant stenosis ICA's. No acute vascular process. 2. Patent vertebral arteries. CTA HEAD: 1. No emergent large vessel occlusion or flow-limiting stenosis. Complete circle-of-Willis. CT PERFUSION: 1. No  perfusion abnormality on this mildly motion degraded examination. Aortic Atherosclerosis (ICD10-I70.0). Electronically Signed   By: Awilda Metro M.D.   On: 02/02/2018 23:02   Ct Angio Neck W Or Wo Contrast  Result Date: 02/02/2018 CLINICAL DATA:  Aphasic, LEFT sided weakness and facial droop. History of stroke, dementia and vertigo. EXAM: CT ANGIOGRAPHY HEAD AND NECK CT PERFUSION BRAIN TECHNIQUE: Multidetector CT imaging of the head and neck was performed using the standard protocol during bolus administration of intravenous contrast. Multiplanar CT image reconstructions and MIPs were obtained to evaluate the vascular anatomy. Carotid stenosis measurements (when applicable) are obtained utilizing NASCET criteria, using the distal internal carotid diameter as the denominator. Multiphase CT imaging of the brain was performed following IV bolus contrast injection. Subsequent parametric perfusion maps were calculated using RAPID software. CONTRAST:  ISOVUE-370 IOPAMIDOL (ISOVUE-370) INJECTION 76% COMPARISON:  CT HEAD February 02, 2018 and MRI head June 09, 2015 FINDINGS: CTA NECK FINDINGS: AORTIC ARCH: Normal appearance of the thoracic arch, normal branch pattern. Mild calcific atherosclerosis. The origins of the innominate, left Common carotid artery and subclavian artery are widely patent. RIGHT CAROTID SYSTEM: Common carotid artery is patent. Normal appearance of the carotid bifurcation without hemodynamically significant stenosis by NASCET criteria. Normal appearance of the internal carotid artery. LEFT CAROTID SYSTEM: Common carotid artery is patent. Normal appearance of the carotid bifurcation without hemodynamically significant stenosis by NASCET criteria. Normal appearance of the internal carotid artery. VERTEBRAL ARTERIES:Left vertebral artery is dominant. Normal appearance of the vertebral arteries, widely patent. SKELETON: No acute osseous process though bone windows have not been submitted.  Mild  old upper thoracic compression fractures. Osteopenia. OTHER NECK: Soft tissues of the neck are nonacute though, not tailored for evaluation. Minimal intravenous subcutaneous gas consistent with recent intravascular access UPPER CHEST: Included lung apices are clear. No superior mediastinal lymphadenopathy. CTA HEAD FINDINGS: ANTERIOR CIRCULATION: Patent cervical internal carotid arteries, petrous, cavernous and supra clinoid internal carotid arteries. Patent anterior communicating artery. Patent anterior and middle cerebral arteries. No large vessel occlusion, significant stenosis, contrast extravasation or aneurysm. POSTERIOR CIRCULATION: Patent vertebral arteries, vertebrobasilar junction and basilar artery, as well as main branch vessels. Blister aneurysm LEFT V4 segment. Patent posterior cerebral arteries. Small bilateral posterior communicating arteries present. No large vessel occlusion, significant stenosis, contrast extravasation or aneurysm. VENOUS SINUSES: Major dural venous sinuses are patent though not tailored for evaluation on this angiographic examination. ANATOMIC VARIANTS: Hypoplastic RIGHT A1 segment. DELAYED PHASE: Not performed. Review of the MIP images confirms the above findings CT Brain Perfusion Findings (mild motion degraded examination): CBF (<30%) Volume: 0mL Perfusion (Tmax>6.0s) volume: 0mL Mismatch Volume: 0mL Infarction Location:None. IMPRESSION: CTA NECK: 1. No hemodynamically significant stenosis ICA's. No acute vascular process. 2. Patent vertebral arteries. CTA HEAD: 1. No emergent large vessel occlusion or flow-limiting stenosis. Complete circle-of-Willis. CT PERFUSION: 1. No perfusion abnormality on this mildly motion degraded examination. Aortic Atherosclerosis (ICD10-I70.0). Electronically Signed   By: Awilda Metroourtnay  Bloomer M.D.   On: 02/02/2018 23:02   Ct Cerebral Perfusion W Contrast  Result Date: 02/02/2018 CLINICAL DATA:  Aphasic, LEFT sided weakness and facial droop.  History of stroke, dementia and vertigo. EXAM: CT ANGIOGRAPHY HEAD AND NECK CT PERFUSION BRAIN TECHNIQUE: Multidetector CT imaging of the head and neck was performed using the standard protocol during bolus administration of intravenous contrast. Multiplanar CT image reconstructions and MIPs were obtained to evaluate the vascular anatomy. Carotid stenosis measurements (when applicable) are obtained utilizing NASCET criteria, using the distal internal carotid diameter as the denominator. Multiphase CT imaging of the brain was performed following IV bolus contrast injection. Subsequent parametric perfusion maps were calculated using RAPID software. CONTRAST:  150mL ISOVUE-370 IOPAMIDOL (ISOVUE-370) INJECTION 76% COMPARISON:  CT HEAD February 02, 2018 and MRI head June 09, 2015 FINDINGS: CTA NECK FINDINGS: AORTIC ARCH: Normal appearance of the thoracic arch, normal branch pattern. Mild calcific atherosclerosis. The origins of the innominate, left Common carotid artery and subclavian artery are widely patent. RIGHT CAROTID SYSTEM: Common carotid artery is patent. Normal appearance of the carotid bifurcation without hemodynamically significant stenosis by NASCET criteria. Normal appearance of the internal carotid artery. LEFT CAROTID SYSTEM: Common carotid artery is patent. Normal appearance of the carotid bifurcation without hemodynamically significant stenosis by NASCET criteria. Normal appearance of the internal carotid artery. VERTEBRAL ARTERIES:Left vertebral artery is dominant. Normal appearance of the vertebral arteries, widely patent. SKELETON: No acute osseous process though bone windows have not been submitted. Mild old upper thoracic compression fractures. Osteopenia. OTHER NECK: Soft tissues of the neck are nonacute though, not tailored for evaluation. Minimal intravenous subcutaneous gas consistent with recent intravascular access UPPER CHEST: Included lung apices are clear. No superior mediastinal  lymphadenopathy. CTA HEAD FINDINGS: ANTERIOR CIRCULATION: Patent cervical internal carotid arteries, petrous, cavernous and supra clinoid internal carotid arteries. Patent anterior communicating artery. Patent anterior and middle cerebral arteries. No large vessel occlusion, significant stenosis, contrast extravasation or aneurysm. POSTERIOR CIRCULATION: Patent vertebral arteries, vertebrobasilar junction and basilar artery, as well as main branch vessels. Blister aneurysm LEFT V4 segment. Patent posterior cerebral arteries. Small bilateral posterior communicating arteries present. No large vessel occlusion, significant stenosis,  contrast extravasation or aneurysm. VENOUS SINUSES: Major dural venous sinuses are patent though not tailored for evaluation on this angiographic examination. ANATOMIC VARIANTS: Hypoplastic RIGHT A1 segment. DELAYED PHASE: Not performed. Review of the MIP images confirms the above findings CT Brain Perfusion Findings (mild motion degraded examination): CBF (<30%) Volume: 0mL Perfusion (Tmax>6.0s) volume: 0mL Mismatch Volume: 0mL Infarction Location:None. IMPRESSION: CTA NECK: 1. No hemodynamically significant stenosis ICA's. No acute vascular process. 2. Patent vertebral arteries. CTA HEAD: 1. No emergent large vessel occlusion or flow-limiting stenosis. Complete circle-of-Willis. CT PERFUSION: 1. No perfusion abnormality on this mildly motion degraded examination. Aortic Atherosclerosis (ICD10-I70.0). Electronically Signed   By: Awilda Metro M.D.   On: 02/02/2018 23:02   Ct Head Code Stroke Wo Contrast  Result Date: 02/02/2018 CLINICAL DATA:  Code stroke. Initial evaluation for acute aphasia, left-sided weakness and facial droop. EXAM: CT HEAD WITHOUT CONTRAST TECHNIQUE: Contiguous axial images were obtained from the base of the skull through the vertex without intravenous contrast. COMPARISON:  Prior MRI from 06/09/2015. FINDINGS: Brain: Advanced age-related cerebral atrophy.  Moderate chronic small vessel ischemic disease. No acute intracranial hemorrhage. No acute large vessel territory infarct. No mass lesion, midline shift or mass effect. No hydrocephalus. No extra-axial fluid collection. Vascular: No hyperdense vessel. Calcified atherosclerosis at the skull base. Skull: Scalp soft tissues and calvarium demonstrate no acute abnormality. Sinuses/Orbits: Globes and orbital soft tissues demonstrate no acute abnormality. Paranasal sinuses and mastoid air cells are clear. Other: None. ASPECTS [facility] Stroke Program Early CT Score) - Ganglionic level infarction (caudate, lentiform nuclei, internal capsule, insula, M1-M3 cortex): 7 - Supraganglionic infarction (M4-M6 cortex): 3 Total score (0-10 with 10 being normal): 10 IMPRESSION: 1. No acute intracranial infarct or other abnormality identified. 2. ASPECTS is 10. 3. Advanced age-related cerebral atrophy with moderate chronic small vessel ischemic disease. Critical Value/emergent results were called by telephone at the time of interpretation on 02/02/2018 at 9:36 pm to Dr. Donnetta Flowers , who verbally acknowledged these results. Electronically Signed   By: Rise Mu M.D.   On: 02/02/2018 21:37     ASSESSMENT AND PLAN   Possible Left MCA stroke - other possibilities include infection ( UA not collected, however CBC normal), Seizure.    # MRI of the brain without contrast #CTA Head and neck performed  #Transthoracic Echo  # No AP/AC #Start or continue Atorvastatin 40 mg/other high intensity statin when passes swallow # BP goal: permissive 180/105 mmHG # HBAIC and Lipid profile # Telemetry monitoring # Frequent neuro checks # stroke swallow screen #PT/OT eval after 24hrs   Dementia - will hold home medications for now  Prior CVA - hold Asa for now - unclear etiology  Please page stroke NP  Or  PA  Or MD from 8am -4 pm  as this patient from this time will be  followed by the stroke.   You can look them up  on [email or web address]  Password [facility]    Catherine Oak [facility] Pager Number [phone]

## 2018-02-03 NOTE — ED Notes (Signed)
[facility]

## 2018-02-03 NOTE — Plan of Care (Signed)
Pt ate 100% of meal tray. Dysphagia 2 diet, thin liquids. Nutrition consult.

## 2018-02-03 NOTE — Evaluation (Signed)
Speech Language Pathology Evaluation Patient Details Name: Evelyn HutchingMildred Nakamura V. MRN: [id number] DOB: Feb 24, 1941 Today's Date: 02/03/2018 Time: [id number] SLP Time Calculation (min) (ACUTE ONLY): 30 min  Problem List:  Patient Active Problem List   Diagnosis Date Noted  . CVA (cerebral vascular accident) (HCC) 02/02/2018  . Failure to thrive in adult 10/14/2017  . Hypernatremia 07/28/2017  . Recurrent falls 07/28/2017  . Apnea spell 06/16/2017  . DNR (do not resuscitate) 06/16/2017  . Osteoporosis 02/18/2017  . Depression, recurrent (HCC) 10/04/2016  . Vitamin D deficiency 08/24/2016  . Dementia 09/26/2015  . Closed intertrochanteric fracture of right femur (HCC) 09/19/2015  . UTI (lower urinary tract infection) 09/19/2015  . Acute encephalopathy 06/09/2015   Past Medical History:  Past Medical History:  Diagnosis Date  . Acute encephalopathy 2012   In context of diarrhea and clinical dehydration  . Dementia    Noncompliant with Aricept as per daughter  . Protein-calorie malnutrition, severe (HCC)   . Stroke (HCC)   . Underweight   . Vertigo    Past Surgical History:  Past Surgical History:  Procedure Laterality Date  . COMPRESSION HIP SCREW Right 09/21/2015   Procedure: COMPRESSION HIP;  Surgeon: Vickki HearingStanley E Harrison, MD;  Location: [facility];  Service: Orthopedics;  Laterality: Right;   HPI:  77 year old female admitted 02/02/18 with aphasia and left sided weakness. PMH: dementia, stroke dysarthria, acute encephalopathy, lightheadedness, malnutrition, dehydration, diarrhea. Daughter reports gradual decline in speech and language skills, raising suspicion for Primary Progressive Aphasia.    Assessment / Plan / Recommendation Clinical Impression  SLE completed at bedside. Per daughter, pt is currently at baseline in terms of speech and language, with progressive decline in communicative effectiveness over the past couple of years. Pt speech is very difficult to understand, due to  low vocal intensity, oral tremors and oral motor weakness. Language skills are also impaired, expressive more than receptive. Pt is able to follow simple one step verbal commands, but has difficulty answering simple yes/no questions accurately and reliably. Expressively, pt has difficulty with automatic sequences and naming, however, initiation is intact. Unable to assess cognition, due to severity of speech and language deficits. Primary progressive aphasia is suspected, given progressive nature of pt deficits. MRI is pending. SLP will follow up after MRI results are available for continued education with family regarding swallow safety. RN and MD informed of results and recommendations.     SLP Assessment  SLP Recommendation/Assessment: Patient does not need any further Speech Language Pathology Services - at baseline for speech/language.  SLP Visit Diagnosis: Cognitive communication deficit (R41.841)    Follow Up Recommendations  Skilled Nursing facility;24 hour supervision/assistance       SLP Evaluation Cognition  Overall Cognitive Status: Difficult to assess Arousal/Alertness: Awake/alert Orientation Level: Oriented to person       Comprehension  Auditory Comprehension Overall Auditory Comprehension: Impaired Yes/No Questions: Impaired Basic Immediate Environment Questions: 75-100% accurate Complex Questions: 50-74% accurate Commands: Impaired One Step Basic Commands: 75-100% accurate Two Step Basic Commands: 50-74% accurate Conversation: Simple EffectiveTechniques: Repetition;Extra processing time Visual Recognition/Discrimination Discrimination: Not tested Reading Comprehension Reading Status: Not tested    Expression Expression Primary Mode of Expression: Verbal Verbal Expression Overall Verbal Expression: Impaired Initiation: Impaired Automatic Speech: Name Level of Generative/Spontaneous Verbalization: Word Repetition: Impaired Level of Impairment: Word  level Naming: Impairment Responsive: 0-25% accurate Confrontation: Impaired Convergent: 0-24% accurate Divergent: 0-24% accurate Pragmatics: Impairment Impairments: Abnormal affect;Eye contact;Monotone Written Expression Dominant Hand: Right   Oral / Motor  Oral Motor/Sensory Function Overall Oral Motor/Sensory Function: Severe impairment(generalized weakness and discoordination) Motor Speech Overall Motor Speech: Impaired Respiration: Within functional limits Resonance: Within functional limits Articulation: Impaired Level of Impairment: Word Intelligibility: Intelligibility reduced Word: 0-24% accurate Phrase: 0-24% accurate Sentence: 0-24% accurate Conversation: 0-24% accurate   GO                   Evelyn Flowers NatalBue[place], [facility] FacilityMSP, CCC-SLP Speech Language Pathologist [phone]  Evelyn Flowers, Evelyn Flowers 02/03/2018, 12:58 PM

## 2018-02-03 NOTE — Progress Notes (Signed)
Initial Nutrition Assessment  DOCUMENTATION CODES:   Non-severe (moderate) malnutrition in context of chronic illness  INTERVENTION:   - Ensure Enlive po TID, each supplement provides 350 kcal and 20 grams of protein  NUTRITION DIAGNOSIS:   Moderate Malnutrition related to chronic illness (dementia) as evidenced by mild fat depletion, mild muscle depletion, moderate muscle depletion, severe muscle depletion.  GOAL:   Patient will meet greater than or equal to 90% of their needs  MONITOR:   PO intake, Supplement acceptance, Labs, Weight trends  REASON FOR ASSESSMENT:   Rounds    ASSESSMENT:   77 year old female who presented to the ED from SNF with suspected stroke. PMH significant for dementia and severe protein-calorie malnutrition. TPA was administered in the ED.  Discussed pt with RN and during ICU rounds. Pt was identified during rounds by RN who alerted RN to history of severe protein-calorie malnutrition.  SLP saw pt today and recommended dysphagia 2 with thin liquids.  Spoke with pt and daughter at bedside. Pt's daughter provided much of pt's history. Of note, pt mobilizes using wheelchair which may explain severe muscle depletions in BLE.  Pt's daughter reports that pt "eats plenty." Pt is provided with 3 meals daily at the SNF where she lives. Per pt's daughter, pt has been on "pureed foods and thickened liquids" and that pt does not eat as much as she used to because the food is not as appetizing and may not be her taste preference. Pt's daughter states pt does have an appetite and will eat food like milkshakes and bananas because she enjoys them.  Per pt's daughter, pt is looking forward to eating chicken salad and cottage cheese at lunch. Pt with chocolate Ensure Enlive at bedside. Pt and pt's daughter agreeable with pt continuing to receive Ensure Enlive during admission. Per pt, she drinks one Ensure daily at the SNF.  Pt's daughter endorses weight loss, stating  that pt weighed 97 lbs "a few years ago" and that this was her lowest weight. Pt's daughter reports that pt's UBW recently is 117-120 lbs.  Per weight history in chart, pt has experienced a 14.8% weight loss over the past 10 months which is not significant for timeframe.  Medications reviewed.  Labs reviewed. CBG's: 134, 99  NUTRITION - FOCUSED PHYSICAL EXAM:    Most Recent Value  Orbital Region  Mild depletion  Upper Arm Region  Mild depletion  Thoracic and Lumbar Region  Mild depletion  Buccal Region  Mild depletion  Temple Region  Mild depletion  Clavicle Bone Region  Moderate depletion  Clavicle and Acromion Bone Region  Moderate depletion  Scapular Bone Region  Mild depletion  Dorsal Hand  Mild depletion  Patellar Region  Moderate depletion  Anterior Thigh Region  Severe depletion  Posterior Calf Region  Severe depletion  Edema (RD Assessment)  None  Hair  Reviewed  Eyes  Reviewed  Mouth  Reviewed  Skin  Reviewed  Nails  Reviewed       Diet Order:   Diet Order            DIET DYS 2 Room service appropriate? Yes; Fluid consistency: Thin  Diet effective now              EDUCATION NEEDS:   No education needs have been identified at this time  Skin:  Skin Assessment: Reviewed RN Assessment  Last BM:  02/03/18 - medium type 5  Height:   Ht Readings from Last 1 Encounters:  02/03/18  5\' 2"  (1.575 m)    Weight:   Wt Readings from Last 1 Encounters:  02/03/18 47.9 kg    Ideal Body Weight:  50 kg  BMI:  Body mass index is 19.31 kg/m.  Estimated Nutritional Needs:   Kcal:  1400-1600  Protein:  65-80 grams  Fluid:  1.4-1.6 L    Earma ReadingKate Jablonski Treyton Slimp, MS, RD, LDN Pager: [phone] Weekend/After Hours: [phone]

## 2018-02-03 NOTE — Progress Notes (Signed)
STROKE TEAM PROGRESS NOTE   SUBJECTIVE (INTERVAL HISTORY) Her daughter is at the bedside.  Obtained more information from daughter.  She stated that since 2013 patient started to have slurred speech, heavy tongue, drunk like speech, however able to comprehend.  In the same time patient started to have intermittent upper extremity tremor bilaterally.  Over the years the speech difficulty progressively worsened.  For the last 1.5 to 2 years, the deficit more noticeable, and she is now in memory care unit in a nursing home.  She had no neurologist, but she has been followed with her PCP and provider in the nursing facility.  Her speech difficulty fluctuate in nursing facility, sometimes better than others.  Last night, nursing staff felt that her speech difficulty getting worse and called code stroke.  However, daughter at bedside at that time felt that could be worsening part of the fluctuation.  However, given the benefit of doubt, telemetry neurologist recommend TPA which daughter was in agreement.  Patient received TPA and transferred to [facility].  CTA head and neck negative CT perfusion negative.  MRI pending.   OBJECTIVE Temp:  [97.8 F (36.6 C)-99 F (37.2 C)] 97.9 F (36.6 C) (08/15 0730) Pulse Rate:  [46-84] 63 (08/15 1000) Cardiac Rhythm: Sinus bradycardia (08/15 0800) Resp:  [10-28] 16 (08/15 1000) BP: (93-164)/(52-111) 148/74 (08/15 1000) SpO2:  [92 %-100 %] 100 % (08/15 1000) Weight:  [47.9 kg-50.3 kg] 47.9 kg (08/15 0318)  Recent Labs  Lab 02/02/18 2129  GLUCAP 99   Recent Labs  Lab 02/02/18 0700 02/02/18 2131 02/02/18 2136  NA 142 143 144  K 3.7 3.5 3.6  CL 105 106 102  CO2 29 32  --   GLUCOSE 125* 112* 106*  BUN 17 21 21   CREATININE 0.72 0.72 0.80  CALCIUM 9.0 9.4  --    Recent Labs  Lab 02/02/18 2131  AST 42*  ALT 35  ALKPHOS 60  BILITOT 0.7  PROT 6.8  ALBUMIN 3.9   Recent Labs  Lab 02/02/18 0700 02/02/18 2131 02/02/18 2136  WBC 5.3 5.8  --    NEUTROABS 3.3 3.2  --   HGB 13.6 13.2 13.6  HCT 41.6 40.9 40.0  MCV 97.2 96.5  --   PLT 164 164  --    No results for input(s): CKTOTAL, CKMB, CKMBINDEX, TROPONINI in the last 168 hours. Recent Labs    02/02/18 2131  LABPROT 12.7  INR 0.97   No results for input(s): COLORURINE, LABSPEC, PHURINE, GLUCOSEU, HGBUR, BILIRUBINUR, KETONESUR, PROTEINUR, UROBILINOGEN, NITRITE, LEUKOCYTESUR in the last 72 hours.  Invalid input(s): APPERANCEUR     Component Value Date/Time   CHOL 155 02/03/2018 0300   TRIG 27 02/03/2018 0300   HDL 61 02/03/2018 0300   CHOLHDL 2.5 02/03/2018 0300   VLDL 5 02/03/2018 0300   LDLCALC 89 02/03/2018 0300   Lab Results  Component Value Date   HGBA1C 5.2 02/03/2018   No results found for: LABOPIA, COCAINSCRNUR, LABBENZ, AMPHETMU, THCU, LABBARB  Recent Labs  Lab 02/02/18 2131  ETH <10    I have personally reviewed the radiological images below and agree with the radiology interpretations.  Ct Angio Head W Or Wo Contrast  Result Date: 02/02/2018 CLINICAL DATA:  Aphasic, LEFT sided weakness and facial droop. History of stroke, dementia and vertigo. EXAM: CT ANGIOGRAPHY HEAD AND NECK CT PERFUSION BRAIN TECHNIQUE: Multidetector CT imaging of the head and neck was performed using the standard protocol during bolus administration of intravenous  contrast. Multiplanar CT image reconstructions and MIPs were obtained to evaluate the vascular anatomy. Carotid stenosis measurements (when applicable) are obtained utilizing NASCET criteria, using the distal internal carotid diameter as the denominator. Multiphase CT imaging of the brain was performed following IV bolus contrast injection. Subsequent parametric perfusion maps were calculated using RAPID software. CONTRAST:  150mL ISOVUE-370 IOPAMIDOL (ISOVUE-370) INJECTION 76% COMPARISON:  CT HEAD February 02, 2018 and MRI head June 09, 2015 FINDINGS: CTA NECK FINDINGS: AORTIC ARCH: Normal appearance of the thoracic arch,  normal branch pattern. Mild calcific atherosclerosis. The origins of the innominate, left Common carotid artery and subclavian artery are widely patent. RIGHT CAROTID SYSTEM: Common carotid artery is patent. Normal appearance of the carotid bifurcation without hemodynamically significant stenosis by NASCET criteria. Normal appearance of the internal carotid artery. LEFT CAROTID SYSTEM: Common carotid artery is patent. Normal appearance of the carotid bifurcation without hemodynamically significant stenosis by NASCET criteria. Normal appearance of the internal carotid artery. VERTEBRAL ARTERIES:Left vertebral artery is dominant. Normal appearance of the vertebral arteries, widely patent. SKELETON: No acute osseous process though bone windows have not been submitted. Mild old upper thoracic compression fractures. Osteopenia. OTHER NECK: Soft tissues of the neck are nonacute though, not tailored for evaluation. Minimal intravenous subcutaneous gas consistent with recent intravascular access UPPER CHEST: Included lung apices are clear. No superior mediastinal lymphadenopathy. CTA HEAD FINDINGS: ANTERIOR CIRCULATION: Patent cervical internal carotid arteries, petrous, cavernous and supra clinoid internal carotid arteries. Patent anterior communicating artery. Patent anterior and middle cerebral arteries. No large vessel occlusion, significant stenosis, contrast extravasation or aneurysm. POSTERIOR CIRCULATION: Patent vertebral arteries, vertebrobasilar junction and basilar artery, as well as main branch vessels. Blister aneurysm LEFT V4 segment. Patent posterior cerebral arteries. Small bilateral posterior communicating arteries present. No large vessel occlusion, significant stenosis, contrast extravasation or aneurysm. VENOUS SINUSES: Major dural venous sinuses are patent though not tailored for evaluation on this angiographic examination. ANATOMIC VARIANTS: Hypoplastic RIGHT A1 segment. DELAYED PHASE: Not performed.  Review of the MIP images confirms the above findings CT Brain Perfusion Findings (mild motion degraded examination): CBF (<30%) Volume: 0mL Perfusion (Tmax>6.0s) volume: 0mL Mismatch Volume: 0mL Infarction Location:None. IMPRESSION: CTA NECK: 1. No hemodynamically significant stenosis ICA's. No acute vascular process. 2. Patent vertebral arteries. CTA HEAD: 1. No emergent large vessel occlusion or flow-limiting stenosis. Complete circle-of-Willis. CT PERFUSION: 1. No perfusion abnormality on this mildly motion degraded examination. Aortic Atherosclerosis (ICD10-I70.0). Electronically Signed   By: Awilda Metroourtnay  Bloomer M.D.   On: 02/02/2018 23:02   Ct Angio Neck W Or Wo Contrast  Result Date: 02/02/2018 CLINICAL DATA:  Aphasic, LEFT sided weakness and facial droop. History of stroke, dementia and vertigo. EXAM: CT ANGIOGRAPHY HEAD AND NECK CT PERFUSION BRAIN TECHNIQUE: Multidetector CT imaging of the head and neck was performed using the standard protocol during bolus administration of intravenous contrast. Multiplanar CT image reconstructions and MIPs were obtained to evaluate the vascular anatomy. Carotid stenosis measurements (when applicable) are obtained utilizing NASCET criteria, using the distal internal carotid diameter as the denominator. Multiphase CT imaging of the brain was performed following IV bolus contrast injection. Subsequent parametric perfusion maps were calculated using RAPID software. CONTRAST:  150mL ISOVUE-370 IOPAMIDOL (ISOVUE-370) INJECTION 76% COMPARISON:  CT HEAD February 02, 2018 and MRI head June 09, 2015 FINDINGS: CTA NECK FINDINGS: AORTIC ARCH: Normal appearance of the thoracic arch, normal branch pattern. Mild calcific atherosclerosis. The origins of the innominate, left Common carotid artery and subclavian artery are widely patent. RIGHT CAROTID SYSTEM: Common  carotid artery is patent. Normal appearance of the carotid bifurcation without hemodynamically significant stenosis by  NASCET criteria. Normal appearance of the internal carotid artery. LEFT CAROTID SYSTEM: Common carotid artery is patent. Normal appearance of the carotid bifurcation without hemodynamically significant stenosis by NASCET criteria. Normal appearance of the internal carotid artery. VERTEBRAL ARTERIES:Left vertebral artery is dominant. Normal appearance of the vertebral arteries, widely patent. SKELETON: No acute osseous process though bone windows have not been submitted. Mild old upper thoracic compression fractures. Osteopenia. OTHER NECK: Soft tissues of the neck are nonacute though, not tailored for evaluation. Minimal intravenous subcutaneous gas consistent with recent intravascular access UPPER CHEST: Included lung apices are clear. No superior mediastinal lymphadenopathy. CTA HEAD FINDINGS: ANTERIOR CIRCULATION: Patent cervical internal carotid arteries, petrous, cavernous and supra clinoid internal carotid arteries. Patent anterior communicating artery. Patent anterior and middle cerebral arteries. No large vessel occlusion, significant stenosis, contrast extravasation or aneurysm. POSTERIOR CIRCULATION: Patent vertebral arteries, vertebrobasilar junction and basilar artery, as well as main branch vessels. Blister aneurysm LEFT V4 segment. Patent posterior cerebral arteries. Small bilateral posterior communicating arteries present. No large vessel occlusion, significant stenosis, contrast extravasation or aneurysm. VENOUS SINUSES: Major dural venous sinuses are patent though not tailored for evaluation on this angiographic examination. ANATOMIC VARIANTS: Hypoplastic RIGHT A1 segment. DELAYED PHASE: Not performed. Review of the MIP images confirms the above findings CT Brain Perfusion Findings (mild motion degraded examination): CBF (<30%) Volume: 0mL Perfusion (Tmax>6.0s) volume: 0mL Mismatch Volume: 0mL Infarction Location:None. IMPRESSION: CTA NECK: 1. No hemodynamically significant stenosis ICA's. No acute  vascular process. 2. Patent vertebral arteries. CTA HEAD: 1. No emergent large vessel occlusion or flow-limiting stenosis. Complete circle-of-Willis. CT PERFUSION: 1. No perfusion abnormality on this mildly motion degraded examination. Aortic Atherosclerosis (ICD10-I70.0). Electronically Signed   By: Awilda Metro M.D.   On: 02/02/2018 23:02   Ct Cerebral Perfusion W Contrast  Result Date: 02/02/2018 CLINICAL DATA:  Aphasic, LEFT sided weakness and facial droop. History of stroke, dementia and vertigo. EXAM: CT ANGIOGRAPHY HEAD AND NECK CT PERFUSION BRAIN TECHNIQUE: Multidetector CT imaging of the head and neck was performed using the standard protocol during bolus administration of intravenous contrast. Multiplanar CT image reconstructions and MIPs were obtained to evaluate the vascular anatomy. Carotid stenosis measurements (when applicable) are obtained utilizing NASCET criteria, using the distal internal carotid diameter as the denominator. Multiphase CT imaging of the brain was performed following IV bolus contrast injection. Subsequent parametric perfusion maps were calculated using RAPID software. CONTRAST:  ISOVUE-370 IOPAMIDOL (ISOVUE-370) INJECTION 76% COMPARISON:  CT HEAD February 02, 2018 and MRI head June 09, 2015 FINDINGS: CTA NECK FINDINGS: AORTIC ARCH: Normal appearance of the thoracic arch, normal branch pattern. Mild calcific atherosclerosis. The origins of the innominate, left Common carotid artery and subclavian artery are widely patent. RIGHT CAROTID SYSTEM: Common carotid artery is patent. Normal appearance of the carotid bifurcation without hemodynamically significant stenosis by NASCET criteria. Normal appearance of the internal carotid artery. LEFT CAROTID SYSTEM: Common carotid artery is patent. Normal appearance of the carotid bifurcation without hemodynamically significant stenosis by NASCET criteria. Normal appearance of the internal carotid artery. VERTEBRAL  ARTERIES:Left vertebral artery is dominant. Normal appearance of the vertebral arteries, widely patent. SKELETON: No acute osseous process though bone windows have not been submitted. Mild old upper thoracic compression fractures. Osteopenia. OTHER NECK: Soft tissues of the neck are nonacute though, not tailored for evaluation. Minimal intravenous subcutaneous gas consistent with recent intravascular access UPPER CHEST: Included lung apices are  clear. No superior mediastinal lymphadenopathy. CTA HEAD FINDINGS: ANTERIOR CIRCULATION: Patent cervical internal carotid arteries, petrous, cavernous and supra clinoid internal carotid arteries. Patent anterior communicating artery. Patent anterior and middle cerebral arteries. No large vessel occlusion, significant stenosis, contrast extravasation or aneurysm. POSTERIOR CIRCULATION: Patent vertebral arteries, vertebrobasilar junction and basilar artery, as well as main branch vessels. Blister aneurysm LEFT V4 segment. Patent posterior cerebral arteries. Small bilateral posterior communicating arteries present. No large vessel occlusion, significant stenosis, contrast extravasation or aneurysm. VENOUS SINUSES: Major dural venous sinuses are patent though not tailored for evaluation on this angiographic examination. ANATOMIC VARIANTS: Hypoplastic RIGHT A1 segment. DELAYED PHASE: Not performed. Review of the MIP images confirms the above findings CT Brain Perfusion Findings (mild motion degraded examination): CBF (<30%) Volume: 0mL Perfusion (Tmax>6.0s) volume: 0mL Mismatch Volume: 0mL Infarction Location:None. IMPRESSION: CTA NECK: 1. No hemodynamically significant stenosis ICA's. No acute vascular process. 2. Patent vertebral arteries. CTA HEAD: 1. No emergent large vessel occlusion or flow-limiting stenosis. Complete circle-of-Willis. CT PERFUSION: 1. No perfusion abnormality on this mildly motion degraded examination. Aortic Atherosclerosis (ICD10-I70.0). Electronically  Signed   By: Awilda Metro M.D.   On: 02/02/2018 23:02   Ct Head Code Stroke Wo Contrast  Result Date: 02/02/2018 CLINICAL DATA:  Code stroke. Initial evaluation for acute aphasia, left-sided weakness and facial droop. EXAM: CT HEAD WITHOUT CONTRAST TECHNIQUE: Contiguous axial images were obtained from the base of the skull through the vertex without intravenous contrast. COMPARISON:  Prior MRI from 06/09/2015. FINDINGS: Brain: Advanced age-related cerebral atrophy. Moderate chronic small vessel ischemic disease. No acute intracranial hemorrhage. No acute large vessel territory infarct. No mass lesion, midline shift or mass effect. No hydrocephalus. No extra-axial fluid collection. Vascular: No hyperdense vessel. Calcified atherosclerosis at the skull base. Skull: Scalp soft tissues and calvarium demonstrate no acute abnormality. Sinuses/Orbits: Globes and orbital soft tissues demonstrate no acute abnormality. Paranasal sinuses and mastoid air cells are clear. Other: None. ASPECTS [facility] Stroke Program Early CT Score) - Ganglionic level infarction (caudate, lentiform nuclei, internal capsule, insula, M1-M3 cortex): 7 - Supraganglionic infarction (M4-M6 cortex): 3 Total score (0-10 with 10 being normal): 10 IMPRESSION: 1. No acute intracranial infarct or other abnormality identified. 2. ASPECTS is 10. 3. Advanced age-related cerebral atrophy with moderate chronic small vessel ischemic disease. Critical Value/emergent results were called by telephone at the time of interpretation on 02/02/2018 at 9:36 pm to Dr. Donnetta Hutching , who verbally acknowledged these results. Electronically Signed   By: Rise Mu M.D.   On: 02/02/2018 21:37   MRI pending  TTE pending   PHYSICAL EXAM  Temp:  [97.8 F (36.6 C)-99 F (37.2 C)] 97.9 F (36.6 C) (08/15 0730) Pulse Rate:  [46-84] 63 (08/15 1000) Resp:  [10-28] 16 (08/15 1000) BP: (93-164)/(52-111) 148/74 (08/15 1000) SpO2:  [92 %-100 %] 100 % (08/15  1000) Weight:  [47.9 kg-50.3 kg] 47.9 kg (08/15 0318)  General -cachectic, well developed, in no apparent distress.  Ophthalmologic - fundi not visualized due to noncooperation.  Cardiovascular - Regular rate and rhythm.  Neuro -awake, alert, following some commands, reacting appropriately to questions, however, not able to pronounce the word but able to articulate the initial sound of the word.  Not able to name or repeat, but again able to articulate the initial sound of the word.  PERRL, EOMI, blinking to visual threat bilaterally, tracking object in both directions.  No significant facial droop, tongue mid line.  Facial sensory symmetrical.  Moving bilateral upper extremities symmetrically,  3/5.  Bilateral lower extremity symmetrical, proximal 0/5, distal 2-3/5.  DTR 1+, no Babinski.  Sensation symmetrical, coordination not cooperative, gait not tested.   ASSESSMENT/PLAN Ms. Doy HutchingMildred Rohwer V. is a 77 y.o. female with history of dementia, mulnutrition, vertigo and stroke admitted for aphasia. tPA given.    Likely primary progressive aphasia, less likely stroke given near baseline presentation  Resultant expressive aphasia  CT head no acute finding  CTA head and neck unremarkable, CTP negative  MRI  pending  2D Echo  pending  EEG pending  LDL 89  HgbA1c 5.2  SCDs for VTE prophylaxis  Diet dysphagia 2 and thin liquid diet  aspirin 325 mg daily prior to admission, now on No antithrombotic within 24h of tPA  Ongoing aggressive stroke risk factor management  Therapy recommendations:  Pending   Disposition:  Pending   Dementia  Likely primary progressive aphasia  Symptoms started 2013, gradually getting worse  Now resides in memory unit  Daughter felt patient is at her baseline  Continue Aricept and Namenda  Hypertension Stable Permissive hypertension (OK if <180/105) for 24-48 hours post stroke and then gradually normalized within 5-7 days.  Long term BP  goal normotensive  Hyperlipidemia  Home meds:  none   LDL 89, goal < 70  Consider statin once passed swallow  Other Stroke Risk Factors  Advanced age  Other Active Problems  malnutrition   Hospital day # 1  This patient is critically ill due to stroke s/p tPA, hx of stroke and at significant risk of neurological worsening, death form recurrent stroke, hemorrhagic conversion, seizure. This patient's care requires constant monitoring of vital signs, hemodynamics, respiratory and cardiac monitoring, review of multiple databases, neurological assessment, discussion with family, other specialists and medical decision making of high complexity. I spent 40 minutes of neurocritical care time in the care of this patient.   Marvel PlanJindong Merle Cirelli, MD PhD Stroke Neurology 02/03/2018 10:47 AM    To contact Stroke Continuity provider, please refer to [email or web address]. After hours, contact General Neurology

## 2018-02-04 ENCOUNTER — Encounter (HOSPITAL_COMMUNITY): Payer: Self-pay | Admitting: General Practice

## 2018-02-04 ENCOUNTER — Inpatient Hospital Stay
Admission: RE | Admit: 2018-02-04 | Discharge: 2018-11-21 | Disposition: E | Payer: Medicare Other | Source: Ambulatory Visit | Attending: Internal Medicine | Admitting: Internal Medicine

## 2018-02-04 ENCOUNTER — Non-Acute Institutional Stay (SKILLED_NURSING_FACILITY): Payer: Medicare Other | Admitting: Internal Medicine

## 2018-02-04 ENCOUNTER — Encounter: Payer: Self-pay | Admitting: Internal Medicine

## 2018-02-04 DIAGNOSIS — F0390 Unspecified dementia without behavioral disturbance: Secondary | ICD-10-CM | POA: Diagnosis not present

## 2018-02-04 DIAGNOSIS — R479 Unspecified speech disturbances: Secondary | ICD-10-CM

## 2018-02-04 DIAGNOSIS — G301 Alzheimer's disease with late onset: Secondary | ICD-10-CM

## 2018-02-04 DIAGNOSIS — F0150 Vascular dementia without behavioral disturbance: Secondary | ICD-10-CM

## 2018-02-04 DIAGNOSIS — F0280 Dementia in other diseases classified elsewhere without behavioral disturbance: Secondary | ICD-10-CM

## 2018-02-04 DIAGNOSIS — R627 Adult failure to thrive: Secondary | ICD-10-CM | POA: Diagnosis not present

## 2018-02-04 DIAGNOSIS — R5381 Other malaise: Secondary | ICD-10-CM

## 2018-02-04 DIAGNOSIS — R54 Age-related physical debility: Secondary | ICD-10-CM

## 2018-02-04 DIAGNOSIS — Z8673 Personal history of transient ischemic attack (TIA), and cerebral infarction without residual deficits: Secondary | ICD-10-CM

## 2018-02-04 DIAGNOSIS — E440 Moderate protein-calorie malnutrition: Secondary | ICD-10-CM

## 2018-02-04 DIAGNOSIS — I639 Cerebral infarction, unspecified: Secondary | ICD-10-CM

## 2018-02-04 DIAGNOSIS — G3101 Pick's disease: Principal | ICD-10-CM

## 2018-02-04 LAB — GLUCOSE, CAPILLARY: GLUCOSE-CAPILLARY: 92 mg/dL (ref 70–99)

## 2018-02-04 NOTE — [place] FL2 (Addendum)
[place] MEDICAID FL2 LEVEL OF CARE SCREENING TOOL     IDENTIFICATION  Patient Name: Evelyn HutchingMildred Flowers V. Birthdate: [birth year] Sex: female Admission Date (Current Location): 02/02/2018  [facility] [place]  [place]elyn [place]   Facility and Address:  [facility], [address], [place], [place] [address]      Provider Number: [id number]  Attending Physician Name and Address:  Marvel PlanXu, Jindong, MD  Relative Name and Phone Number:       Current Level of Care: Hospital Recommended Level of Care: Skilled Nursing Facility Prior Approval Number:    Date Approved/Denied:   PASRR Number:    Discharge Plan: SNF    Current Diagnoses: Patient Active Problem List   Diagnosis Date Noted  . Malnutrition of moderate degree July 01, 2017  . CVA (cerebral vascular accident) (HCC) 02/02/2018  . Failure to thrive in adult 10/14/2017  . Hypernatremia 07/28/2017  . Recurrent falls 07/28/2017  . Apnea spell 06/16/2017  . DNR (do not resuscitate) 06/16/2017  . Osteoporosis 02/18/2017  . Depression, recurrent (HCC) 10/04/2016  . Vitamin D deficiency 08/24/2016  . Dementia 09/26/2015  . Closed intertrochanteric fracture of right femur (HCC) 09/19/2015  . UTI (lower urinary tract infection) 09/19/2015  . Acute encephalopathy 06/09/2015    Orientation RESPIRATION BLADDER Height & Weight     Self  O2([place] 2L) Incontinent Weight: 105 lb 9.6 oz (47.9 kg) Height:  5\' 2"  (157.5 cm)  BEHAVIORAL SYMPTOMS/MOOD NEUROLOGICAL BOWEL NUTRITION STATUS      Incontinent Diet(fine chop)  AMBULATORY STATUS COMMUNICATION OF NEEDS Skin   Extensive Assist Verbally Normal                       Personal Care Assistance Level of Assistance  Bathing, Feeding, Dressing Bathing Assistance: Maximum assistance Feeding assistance: Limited assistance Dressing Assistance: Maximum assistance     Functional Limitations Info  Sight, Hearing, Speech Sight Info: Adequate Hearing Info:  Adequate Speech Info: Adequate    SPECIAL CARE FACTORS FREQUENCY  PT (By licensed PT), OT (By licensed OT); Speech therapy     PT Frequency: 5x/wk OT Frequency: 5x/wk    SLP: 5x/wk        Contractures Contractures Info: Not present    Additional Factors Info  Code Status, Allergies, Psychotropic Code Status Info: DNR Allergies Info: Beef-derived Products Psychotropic Info: Aricept 5mg  daily at bed; Remeron 7.5mg  daily at bed; Zoloft 25mg  daily at 10 am         Current Medications (November 28, 2017):  This is the current hospital active medication list Current Facility-Administered Medications  Medication Dose Route Frequency Provider Last Rate Last Dose  . acetaminophen (TYLENOL) tablet 650 mg  650 mg Oral Q4H PRN Aroor, Georgiana SpinnerSushanth R, MD      . aspirin chewable tablet 81 mg  81 mg Oral Daily Aroor, Dara LordsSushanth R, MD   81 mg at 02/03/18 2351  . calcium carbonate (OS-CAL - dosed in mg of elemental calcium) tablet 500 mg of elemental calcium  1 tablet Oral BID WC Marvel PlanXu, Jindong, MD   500 mg of elemental calcium at 02/03/18 1819  . cholecalciferol (VITAMIN D) tablet 2,000 Units  2,000 Units Oral q morning - 10a Marvel PlanXu, Jindong, MD      . donepezil (ARICEPT) tablet 5 mg  5 mg Oral QHS Marvel PlanXu, Jindong, MD   5 mg at 02/03/18 2157  . feeding supplement (ENSURE ENLIVE) (ENSURE ENLIVE) liquid 237 mL  237 mL Oral TID BM Marvel PlanXu, Jindong,  MD   237 mL at 02/03/18 2100  . labetalol (NORMODYNE,TRANDATE) injection 5 mg  5 mg Intravenous Once Donnetta Hutchingook, Brian, MD      . MEDLINE mouth rinse  15 mL Mouth Rinse BID Marvel PlanXu, Jindong, MD   15 mL at 02/03/18 2158  . memantine (NAMENDA) tablet 5 mg  5 mg Oral BID Marvel PlanXu, Jindong, MD   5 mg at 02/03/18 2157  . mirtazapine (REMERON) tablet 7.5 mg  7.5 mg Oral QHS Marvel PlanXu, Jindong, MD   7.5 mg at 02/03/18 2157  . senna-docusate (Senokot-S) tablet 1 tablet  1 tablet Oral QHS PRN Aroor, Georgiana SpinnerSushanth R, MD      . sertraline (ZOLOFT) tablet 25 mg  25 mg Oral q morning - 10a Marvel PlanXu, Jindong, MD          Discharge Medications: Please see discharge summary for a list of discharge medications.  Relevant Imaging Results:  Relevant Lab Results:   Additional Information SS#: [id number]  [name], LCSW

## 2018-02-04 NOTE — Discharge Summary (Addendum)
Physician Discharge Summary  Patient ID: Evelyn Flowers MRN: [id number] DOB/AGE: 02-17-1941 77 y.o.  Admit date: 02/02/2018 Discharge date: 02-26-18  Admission Diagnoses: Suspected stroke s/p tPA Discharge Diagnoses:  Dementia with primary progressive aphasia Debility Advanced age   Discharged Condition:  stable Hospital Course:  Evelyn Flowers was admitted in transfer after being treated with IV tPA for increased speech difficulty. Stroke wk up done. MRI neg for stroke. EEG neg for seizures. She should continue her daily ASA, but there is no indication for statin. Pt is now back to baseline dementia/aphasia per pts dtr at bedside. She has had a progressive aphasia with wax/waning for 'years" per daughter. She is debilitated and has She will return to her SNF for continued dementia care. I have discussed the d/c plan with the pts daughter who is active in goals of care.  D/c time Consults: none  Significant Diagnostic Studies:  Ct Angio Head W Or Wo Contrast  Result Date: 02/02/2018 CLINICAL DATA:  Aphasic, LEFT sided weakness and facial droop. History of stroke, dementia and vertigo. EXAM: CT ANGIOGRAPHY HEAD AND NECK CT PERFUSION BRAIN TECHNIQUE: Multidetector CT imaging of the head and neck was performed using the standard protocol during bolus administration of intravenous contrast. Multiplanar CT image reconstructions and MIPs were obtained to evaluate the vascular anatomy. Carotid stenosis measurements (when applicable) are obtained utilizing NASCET criteria, using the distal internal carotid diameter as the denominator. Multiphase CT imaging of the brain was performed following IV bolus contrast injection. Subsequent parametric perfusion maps were calculated using RAPID software. CONTRAST:  ISOVUE-370 IOPAMIDOL (ISOVUE-370) INJECTION 76% COMPARISON:  CT HEAD February 02, 2018 and MRI head June 09, 2015 FINDINGS: CTA NECK FINDINGS: AORTIC ARCH: Normal appearance of  the thoracic arch, normal branch pattern. Mild calcific atherosclerosis. The origins of the innominate, left Common carotid artery and subclavian artery are widely patent. RIGHT CAROTID SYSTEM: Common carotid artery is patent. Normal appearance of the carotid bifurcation without hemodynamically significant stenosis by NASCET criteria. Normal appearance of the internal carotid artery. LEFT CAROTID SYSTEM: Common carotid artery is patent. Normal appearance of the carotid bifurcation without hemodynamically significant stenosis by NASCET criteria. Normal appearance of the internal carotid artery. VERTEBRAL ARTERIES:Left vertebral artery is dominant. Normal appearance of the vertebral arteries, widely patent. SKELETON: No acute osseous process though bone windows have not been submitted. Mild old upper thoracic compression fractures. Osteopenia. OTHER NECK: Soft tissues of the neck are nonacute though, not tailored for evaluation. Minimal intravenous subcutaneous gas consistent with recent intravascular access UPPER CHEST: Included lung apices are clear. No superior mediastinal lymphadenopathy. CTA HEAD FINDINGS: ANTERIOR CIRCULATION: Patent cervical internal carotid arteries, petrous, cavernous and supra clinoid internal carotid arteries. Patent anterior communicating artery. Patent anterior and middle cerebral arteries. No large vessel occlusion, significant stenosis, contrast extravasation or aneurysm. POSTERIOR CIRCULATION: Patent vertebral arteries, vertebrobasilar junction and basilar artery, as well as main branch vessels. Blister aneurysm LEFT V4 segment. Patent posterior cerebral arteries. Small bilateral posterior communicating arteries present. No large vessel occlusion, significant stenosis, contrast extravasation or aneurysm. VENOUS SINUSES: Major dural venous sinuses are patent though not tailored for evaluation on this angiographic examination. ANATOMIC VARIANTS: Hypoplastic RIGHT A1 segment. DELAYED  PHASE: Not performed. Review of the MIP images confirms the above findings CT Brain Perfusion Findings (mild motion degraded examination): CBF (<30%) Volume: 0mL Perfusion (Tmax>6.0s) volume: 0mL Mismatch Volume: 0mL Infarction Location:None. IMPRESSION: CTA NECK: 1. No hemodynamically significant stenosis ICA's. No acute vascular process. 2. Patent vertebral arteries.  CTA HEAD: 1. No emergent large vessel occlusion or flow-limiting stenosis. Complete circle-of-Willis. CT PERFUSION: 1. No perfusion abnormality on this mildly motion degraded examination. Aortic Atherosclerosis (ICD10-I70.0). Electronically Signed   By: Awilda Metro M.D.   On: 02/02/2018 23:02   Ct Angio Neck W Or Wo Contrast  Result Date: 02/02/2018 CLINICAL DATA:  Aphasic, LEFT sided weakness and facial droop. History of stroke, dementia and vertigo. EXAM: CT ANGIOGRAPHY HEAD AND NECK CT PERFUSION BRAIN TECHNIQUE: Multidetector CT imaging of the head and neck was performed using the standard protocol during bolus administration of intravenous contrast. Multiplanar CT image reconstructions and MIPs were obtained to evaluate the vascular anatomy. Carotid stenosis measurements (when applicable) are obtained utilizing NASCET criteria, using the distal internal carotid diameter as the denominator. Multiphase CT imaging of the brain was performed following IV bolus contrast injection. Subsequent parametric perfusion maps were calculated using RAPID software. CONTRAST:  ISOVUE-370 IOPAMIDOL (ISOVUE-370) INJECTION 76% COMPARISON:  CT HEAD February 02, 2018 and MRI head June 09, 2015 FINDINGS: CTA NECK FINDINGS: AORTIC ARCH: Normal appearance of the thoracic arch, normal branch pattern. Mild calcific atherosclerosis. The origins of the innominate, left Common carotid artery and subclavian artery are widely patent. RIGHT CAROTID SYSTEM: Common carotid artery is patent. Normal appearance of the carotid bifurcation without hemodynamically  significant stenosis by NASCET criteria. Normal appearance of the internal carotid artery. LEFT CAROTID SYSTEM: Common carotid artery is patent. Normal appearance of the carotid bifurcation without hemodynamically significant stenosis by NASCET criteria. Normal appearance of the internal carotid artery. VERTEBRAL ARTERIES:Left vertebral artery is dominant. Normal appearance of the vertebral arteries, widely patent. SKELETON: No acute osseous process though bone windows have not been submitted. Mild old upper thoracic compression fractures. Osteopenia. OTHER NECK: Soft tissues of the neck are nonacute though, not tailored for evaluation. Minimal intravenous subcutaneous gas consistent with recent intravascular access UPPER CHEST: Included lung apices are clear. No superior mediastinal lymphadenopathy. CTA HEAD FINDINGS: ANTERIOR CIRCULATION: Patent cervical internal carotid arteries, petrous, cavernous and supra clinoid internal carotid arteries. Patent anterior communicating artery. Patent anterior and middle cerebral arteries. No large vessel occlusion, significant stenosis, contrast extravasation or aneurysm. POSTERIOR CIRCULATION: Patent vertebral arteries, vertebrobasilar junction and basilar artery, as well as main branch vessels. Blister aneurysm LEFT V4 segment. Patent posterior cerebral arteries. Small bilateral posterior communicating arteries present. No large vessel occlusion, significant stenosis, contrast extravasation or aneurysm. VENOUS SINUSES: Major dural venous sinuses are patent though not tailored for evaluation on this angiographic examination. ANATOMIC VARIANTS: Hypoplastic RIGHT A1 segment. DELAYED PHASE: Not performed. Review of the MIP images confirms the above findings CT Brain Perfusion Findings (mild motion degraded examination): CBF (<30%) Volume: 0mL Perfusion (Tmax>6.0s) volume: 0mL Mismatch Volume: 0mL Infarction Location:None. IMPRESSION: CTA NECK: 1. No hemodynamically significant  stenosis ICA's. No acute vascular process. 2. Patent vertebral arteries. CTA HEAD: 1. No emergent large vessel occlusion or flow-limiting stenosis. Complete circle-of-Willis. CT PERFUSION: 1. No perfusion abnormality on this mildly motion degraded examination. Aortic Atherosclerosis (ICD10-I70.0). Electronically Signed   By: Awilda Metro M.D.   On: 02/02/2018 23:02   Mr Brain Wo Contrast  Result Date: 02/03/2018 CLINICAL DATA:  Stroke follow-up, status post tPA administration. EXAM: MRI HEAD WITHOUT CONTRAST TECHNIQUE: Multiplanar, multiecho pulse sequences of the brain and surrounding structures were obtained without intravenous contrast. COMPARISON:  None. FINDINGS: BRAIN: There is no acute infarct, acute hemorrhage or mass effect. The midline structures are normal. There are no old infarcts. Diffuse confluent hyperintense T2-weighted signal within  the periventricular, deep and juxtacortical white matter, most commonly due to chronic ischemic microangiopathy. Advanced brain parenchymal atrophy. Multiple enlarged perivascular spaces of the lentiform nuclei. Susceptibility-sensitive sequences show no chronic microhemorrhage or superficial siderosis. VASCULAR: Major intracranial arterial and venous sinus flow voids are preserved. SKULL AND UPPER CERVICAL SPINE: The visualized skull base, calvarium, upper cervical spine and extracranial soft tissues are normal. SINUSES/ORBITS: No fluid levels or advanced mucosal thickening. No mastoid or middle ear effusion. The orbits are normal. IMPRESSION: 1. No acute intracranial abnormality. 2. Severe brain volume loss and chronic microvascular disease. Electronically Signed   By: Deatra RobinsonKevin  Herman M.D.   On: 02/03/2018 20:57   Ct Cerebral Perfusion W Contrast  Result Date: 02/02/2018 CLINICAL DATA:  Aphasic, LEFT sided weakness and facial droop. History of stroke, dementia and vertigo. EXAM: CT ANGIOGRAPHY HEAD AND NECK CT PERFUSION BRAIN TECHNIQUE: Multidetector CT  imaging of the head and neck was performed using the standard protocol during bolus administration of intravenous contrast. Multiplanar CT image reconstructions and MIPs were obtained to evaluate the vascular anatomy. Carotid stenosis measurements (when applicable) are obtained utilizing NASCET criteria, using the distal internal carotid diameter as the denominator. Multiphase CT imaging of the brain was performed following IV bolus contrast injection. Subsequent parametric perfusion maps were calculated using RAPID software. CONTRAST:  150mL ISOVUE-370 IOPAMIDOL (ISOVUE-370) INJECTION 76% COMPARISON:  CT HEAD February 02, 2018 and MRI head June 09, 2015 FINDINGS: CTA NECK FINDINGS: AORTIC ARCH: Normal appearance of the thoracic arch, normal branch pattern. Mild calcific atherosclerosis. The origins of the innominate, left Common carotid artery and subclavian artery are widely patent. RIGHT CAROTID SYSTEM: Common carotid artery is patent. Normal appearance of the carotid bifurcation without hemodynamically significant stenosis by NASCET criteria. Normal appearance of the internal carotid artery. LEFT CAROTID SYSTEM: Common carotid artery is patent. Normal appearance of the carotid bifurcation without hemodynamically significant stenosis by NASCET criteria. Normal appearance of the internal carotid artery. VERTEBRAL ARTERIES:Left vertebral artery is dominant. Normal appearance of the vertebral arteries, widely patent. SKELETON: No acute osseous process though bone windows have not been submitted. Mild old upper thoracic compression fractures. Osteopenia. OTHER NECK: Soft tissues of the neck are nonacute though, not tailored for evaluation. Minimal intravenous subcutaneous gas consistent with recent intravascular access UPPER CHEST: Included lung apices are clear. No superior mediastinal lymphadenopathy. CTA HEAD FINDINGS: ANTERIOR CIRCULATION: Patent cervical internal carotid arteries, petrous, cavernous and supra  clinoid internal carotid arteries. Patent anterior communicating artery. Patent anterior and middle cerebral arteries. No large vessel occlusion, significant stenosis, contrast extravasation or aneurysm. POSTERIOR CIRCULATION: Patent vertebral arteries, vertebrobasilar junction and basilar artery, as well as main branch vessels. Blister aneurysm LEFT V4 segment. Patent posterior cerebral arteries. Small bilateral posterior communicating arteries present. No large vessel occlusion, significant stenosis, contrast extravasation or aneurysm. VENOUS SINUSES: Major dural venous sinuses are patent though not tailored for evaluation on this angiographic examination. ANATOMIC VARIANTS: Hypoplastic RIGHT A1 segment. DELAYED PHASE: Not performed. Review of the MIP images confirms the above findings CT Brain Perfusion Findings (mild motion degraded examination): CBF (<30%) Volume: 0mL Perfusion (Tmax>6.0s) volume: 0mL Mismatch Volume: 0mL Infarction Location:None. IMPRESSION: CTA NECK: 1. No hemodynamically significant stenosis ICA's. No acute vascular process. 2. Patent vertebral arteries. CTA HEAD: 1. No emergent large vessel occlusion or flow-limiting stenosis. Complete circle-of-Willis. CT PERFUSION: 1. No perfusion abnormality on this mildly motion degraded examination. Aortic Atherosclerosis (ICD10-I70.0). Electronically Signed   By: Awilda Metroourtnay  Bloomer M.D.   On: 02/02/2018 23:02   Ct  Head Code Stroke Wo Contrast  Result Date: 02/02/2018 CLINICAL DATA:  Code stroke. Initial evaluation for acute aphasia, left-sided weakness and facial droop. EXAM: CT HEAD WITHOUT CONTRAST TECHNIQUE: Contiguous axial images were obtained from the base of the skull through the vertex without intravenous contrast. COMPARISON:  Prior MRI from 06/09/2015. FINDINGS: Brain: Advanced age-related cerebral atrophy. Moderate chronic small vessel ischemic disease. No acute intracranial hemorrhage. No acute large vessel territory infarct. No mass  lesion, midline shift or mass effect. No hydrocephalus. No extra-axial fluid collection. Vascular: No hyperdense vessel. Calcified atherosclerosis at the skull base. Skull: Scalp soft tissues and calvarium demonstrate no acute abnormality. Sinuses/Orbits: Globes and orbital soft tissues demonstrate no acute abnormality. Paranasal sinuses and mastoid air cells are clear. Other: None. ASPECTS [facility] Stroke Program Early CT Score) - Ganglionic level infarction (caudate, lentiform nuclei, internal capsule, insula, M1-M3 cortex): 7 - Supraganglionic infarction (M4-M6 cortex): 3 Total score (0-10 with 10 being normal): 10 IMPRESSION: 1. No acute intracranial infarct or other abnormality identified. 2. ASPECTS is 10. 3. Advanced age-related cerebral atrophy with moderate chronic small vessel ischemic disease. Critical Value/emergent results were called by telephone at the time of interpretation on 02/02/2018 at 9:36 pm to Dr. Donnetta Hutching , who verbally acknowledged these results. Electronically Signed   By: Rise Mu M.D.   On: 02/02/2018 21:37   Treatments: IV tPA  Discharge Exam: Blood pressure 135/68, pulse 81, temperature 97.8 F (36.6 C), temperature source Oral, resp. rate 18, height 5\' 2"  (1.575 m), weight 47.9 kg, SpO2 100 %. Alert, calm and cooperative, no distress. Speech is dysarthric, but able to state name. Otherwise, she is at baseline dementia for other elements of orientation. Gen and diffusely weak, but moving all extremities equally. Sensation intact to LT. PERRL, EOMI. Face symmetric.   Disposition:  Stable  Allergies as of 01/21/2018      Reactions   Beef-derived Products    Unknown reaction-Listed on MAR      Medication List    TAKE these medications   acetaminophen 325 MG tablet Commonly known as:  TYLENOL Take 2 tablets (650 mg total) by mouth every 6 (six) hours as needed for mild pain (or Fever >/= 101).   aspirin 325 MG EC tablet Take 1 tablet (325 mg total) by  mouth daily with breakfast.   calcium carbonate 1250 (500 Ca) MG tablet Commonly known as:  OS-CAL - dosed in mg of elemental calcium Take 1 tablet by mouth 2 (two) times daily with a meal.   Cholecalciferol 1000 units tablet Take 2,000 Units by mouth every morning.   donepezil 5 MG tablet Commonly known as:  ARICEPT Take 5 mg by mouth at bedtime.   memantine 5 MG tablet Commonly known as:  NAMENDA Take 5 mg by mouth 2 (two) times daily.   mirtazapine 15 MG tablet Commonly known as:  REMERON Take 7.5 mg by mouth at bedtime.   senna-docusate 8.6-50 MG tablet Commonly known as:  Senokot-S Take 1 tablet by mouth 2 (two) times daily.   sertraline 25 MG tablet Commonly known as:  ZOLOFT Take 25 mg by mouth every morning.   triamcinolone 0.025 % cream Commonly known as:  KENALOG Apply 1 application topically 2 (two) times daily as needed (for itching/rash).        Signed: Desiree Metzger-Cihelka 01/30/2018, 8:34 AM   ATTENDING NOTE: I reviewed above note and agree with the assessment and plan. I have made any additions or clarifications directly to the  above note. Pt was seen and examined.   Pt at her baseline. No neuro changes overnight. MRI no acute stroke and EEG negative for seizure. Resume home meds and she is ready for discharge. No stroke neuro follow up at this time needed.  Marvel PlanJindong Roselle Norton, MD PhD Stroke Neurology Jun 27, 2017 2:14 PM

## 2018-02-04 NOTE — Clinical Social Work Note (Signed)
Clinical Social Work Assessment  Patient Details  Name: Evelyn HutchingMildred Flowers V. MRN: [id number] Date of Birth: 01/13/1941  Date of referral:  02/11/2018               Reason for consult:  Facility Placement                Permission sought to share information with:  Facility Medical sales representativeContact Representative, Family Supports Permission granted to share information::  Yes, Verbal Permission Granted  Name::     Clinical cytogeneticisthannon  Agency::  [facility]  Relationship::  Daughter  Contact Information:     Housing/Transportation Living arrangements for the past 2 months:  Skilled Building surveyorursing Facility Source of Information:  Medical Team, Adult Children Patient Interpreter Needed:  None Criminal Activity/Legal Involvement Pertinent to Current Situation/Hospitalization:  No - Comment as needed Significant Relationships:  Adult Children Lives with:  Self, Facility Resident Do you feel safe going back to the place where you live?  No Need for family participation in patient care:  No (Coment)  Care giving concerns:  [facility] and family reports no concerns about care received.   Social Worker assessment / plan:  CSW alerted by MD that patient is medically stable and can return to SNF. CSW confirmed plan with family and SNF. CSW coordinated discharge plan.  Employment status:  Retired Health and safety inspectornsurance information:  Medicare PT Recommendations:  Skilled Nursing Facility Information / Referral to community resources:  Skilled Nursing Facility  Patient/Family's Response to care:  Patient's [facility] agreeable to [facility].  Patient/Family's Understanding of and Emotional Response to Diagnosis, Current Treatment, and Prognosis:  Patient's daughter was happy when CSW reported that the patient could go back home, and appreciative of care received at the hospital.   Emotional Assessment Appearance:  Appears stated age Attitude/Demeanor/Rapport:  Unable to Assess Affect (typically observed):  Unable to  Assess Orientation:  Oriented to Self Alcohol / Substance use:  Not Applicable Psych involvement (Current and /or in the community):  No (Comment)  Discharge Needs  Concerns to be addressed:  Care Coordination Readmission within the last 30 days:  No Current discharge risk:  None Barriers to Discharge:  No Barriers Identified   Evelyn Lenislizabeth M Everette Dimauro, LCSW 02/10/2018, 12:54 PM

## 2018-02-04 NOTE — Progress Notes (Signed)
Discharge to: [facility] Anticipated discharge date: 01/24/2018 Family notified: Yes, at bedside Transportation by: [facility]  Report #: [id number]  CSW signing off.  Blenda Nicelylizabeth Toddrick Sanna LCSW [phone]

## 2018-02-04 NOTE — Progress Notes (Addendum)
Location:    [facility] Room Number: 154/D Place of Service:  SNF [phone]) Provider: Edmon Crape PA-C  Mahlon Gammon, MD  Patient Care Team: Mahlon Gammon, MD as PCP - General (Internal Medicine) Synetta Shadow as Physician Assistant (Internal Medicine)  Extended Emergency Contact Information Primary Emergency Contact: [facility] Address: [address] [place], [place] [address] [place] Home Phone: [phone] Mobile Phone: [phone] Relation: Daughter Secondary Emergency Contact: Gena Fray [place] of [place] Mobile Phone: [phone] Relation: Son  Code Status:  DNR Goals of care: Advanced Directive information Advanced Directives 2018/02/15  Does Patient Have a Medical Advance Directive? Yes  Type of Advance Directive Out of facility DNR (pink MOST or yellow form)  Does patient want to make changes to medical advance directive? No - Patient declined  Copy of Healthcare Power of Attorney in Chart? No - copy requested  Would patient like information on creating a medical advance directive? No - Patient declined  Pre-existing out of facility DNR order (yellow form or pink MOST form) -     Chief Complaint  Patient presents with  . Hospitalization Follow-up    Patient is being seen for hospitalization f/u   For slurred speech and change in status  s8 y.o. female seen today for follow-up of a short hospitalization.  Patient was found to have increased speech difficulty and facility and possibly some increased weakness- she was treated with IV TPA for this in the ER- stroke up work-up was done but MRI was negative for stroke EEG was negative for seizures.  Recommendation was for daily aspirin which she was already on but no indication for statin  Lab work done in the ER appear to be stable and baseline with previous values in fact she just had lab work done that morning secondary to a routine  visit and ER values appeared roughly comparable  Apparently she  dideturn fairly quickly to her baseline-she does have a history of progressive aphasia with severe dementia.--Previous history of CVA  Her other diagnoses include osteoporosis with a history of right femoral fracture-failure to thrive depression constipation and vitamin D deficiency  Today she is back to lying in her bed comfortably does not show any signs of distress she appears to be comfortable she does make eye contact and attempts to speak somewhat--appears to be essentially how she presented when I saw her before her hospitalization   Past Medical History:  Diagnosis Date  . Acute encephalopathy 2012   In context of diarrhea and clinical dehydration  . Dementia    Noncompliant with Aricept as per daughter  . Protein-calorie malnutrition, severe (HCC)   . Stroke (HCC)   . Underweight   . Vertigo    Past Surgical History:  Procedure Laterality Date  . COMPRESSION HIP SCREW Right 09/21/2015   Procedure: COMPRESSION HIP;  Surgeon: Vickki Hearing, MD;  Location: [facility];  Service: Orthopedics;  Laterality: Right;    Allergies  Allergen Reactions  . Beef-Derived Products     Unknown reaction-Listed on MAR    Facility-Administered Encounter Medications as of 02/15/2018  Medication  . acetaminophen (TYLENOL) tablet 650 mg  . aspirin chewable tablet 81 mg  . calcium carbonate (OS-CAL - dosed in mg of elemental calcium) tablet 500 mg of elemental calcium  . cholecalciferol (VITAMIN D) tablet 2,000 Units  . donepezil (ARICEPT) tablet 5 mg  . feeding supplement (  ENSURE ENLIVE) (ENSURE ENLIVE) liquid 237 mL  . labetalol (NORMODYNE,TRANDATE) injection 5 mg  . MEDLINE mouth rinse  . memantine (NAMENDA) tablet 5 mg  . mirtazapine (REMERON) tablet 7.5 mg  . senna-docusate (Senokot-S) tablet 1 tablet  . sertraline (ZOLOFT) tablet 25 mg   Outpatient Encounter Medications as of 2018-06-04  Medication Sig  .  acetaminophen (TYLENOL) 325 MG tablet Take 2 tablets (650 mg total) by mouth every 6 (six) hours as needed for mild pain (or Fever >/= 101).  Marland Kitchen. aspirin EC 325 MG EC tablet Take 1 tablet (325 mg total) by mouth daily with breakfast.  . calcium carbonate (OS-CAL - DOSED IN MG OF ELEMENTAL CALCIUM) 1250 (500 Ca) MG tablet Take 1 tablet by mouth 2 (two) times daily with a meal.  . Cholecalciferol 1000 units tablet Take 2,000 Units by mouth every morning.   . donepezil (ARICEPT) 5 MG tablet Take 5 mg by mouth at bedtime.  . memantine (NAMENDA) 5 MG tablet Take 5 mg by mouth 2 (two) times daily.  . mirtazapine (REMERON) 15 MG tablet Take 7.5 mg by mouth at bedtime.   . senna-docusate (SENOKOT-S) 8.6-50 MG tablet Take 1 tablet by mouth 2 (two) times daily.  . sertraline (ZOLOFT) 25 MG tablet Take 25 mg by mouth every morning.   . triamcinolone (KENALOG) 0.025 % cream Apply 1 application topically 2 (two) times daily as needed (for itching/rash).      Review of Systems   Unobtainable secondary to dementia please see HPI  Immunization History  Administered Date(s) Administered  . Influenza-Unspecified 03/20/2016, 05/03/2017  . PPD Test 06/12/2015  . Pneumococcal Conjugate-13 05/03/2017  . Pneumococcal-Unspecified 04/02/2016  . Tdap 03/04/2017   Pertinent  Health Maintenance Due  Topic Date Due  . INFLUENZA VACCINE  03/04/2018 (Originally 01/20/2018)  . DEXA SCAN  Completed  . PNA vac Low Risk Adult  Completed   Fall Risk  03/01/2017  Falls in the past year? No   Functional Status Survey:      Physical Exam   She is afebrile pulse is 72 respirations of 17 blood pressure 130/80  In general this is a frail elderly female who appears to be relatively baseline with previous exam she is bright alert and makes eye contact-she does attempt to speak a small amount.  Her skin is warm and dry.  Eyes sclera and conjunctive are clear she has prescription lenses she does make eye contact visual  acuity appears grossly intact.  Oropharynx is clear mucous membranes moist.  Chest is clear to auscultation there is no labored breathing.  Abdomen is soft nontender with positive bowel sounds.  Musculoskeletal is able to grip my fingers with her hands bilaterally strength appears to be baseline upper extremities has stiffness of her lower extremities which is baseline.  Neurologic she is alert right makes eye contact speaks minimal bili but appears to be at her baseline she is able to open her mouth and move her tongue at baseline cranial nerves appear grossly intact-appears to move her extremities again at baseline.  Psych again findings consistent with severe dementia but she does follow simple verbal commands and is cooperative with exam  Labs reviewed: Recent Labs    12/24/17 0415 02/02/18 0700 02/02/18 2131 02/02/18 2136  NA 143 142 143 144  K 3.5 3.7 3.5 3.6  CL 108 105 106 102  CO2 30 29 32  --   GLUCOSE 86 125* 112* 106*  BUN 13 17 21  21  CREATININE 0.65 0.72 0.72 0.80  CALCIUM 8.9 9.0 9.4  --    Recent Labs    11/11/17 0708 12/24/17 0415 02/02/18 2131  AST 27 28 42*  ALT 29 26 35  ALKPHOS 61 53 60  BILITOT 0.7 0.4 0.7  PROT 6.9 5.9* 6.8  ALBUMIN 4.0 3.3* 3.9   Recent Labs    12/24/17 0415 02/02/18 0700 02/02/18 2131 02/02/18 2136  WBC 5.1 5.3 5.8  --   NEUTROABS 1.8 3.3 3.2  --   HGB 12.0 13.6 13.2 13.6  HCT 37.3 41.6 40.9 40.0  MCV 96.9 97.2 96.5  --   PLT 178 164 164  --    Lab Results  Component Value Date   TSH 2.391 02/19/2017   Lab Results  Component Value Date   HGBA1C 5.2 02/03/2018   Lab Results  Component Value Date   CHOL 155 02/03/2018   HDL 61 02/03/2018   LDLCALC 89 02/03/2018   TRIG 27 02/03/2018   CHOLHDL 2.5 02/03/2018    Significant Diagnostic Results in last 30 days:  Ct Angio Head W Or Wo Contrast  Result Date: 02/02/2018 CLINICAL DATA:  Aphasic, LEFT sided weakness and facial droop. History of stroke, dementia  and vertigo. EXAM: CT ANGIOGRAPHY HEAD AND NECK CT PERFUSION BRAIN TECHNIQUE: Multidetector CT imaging of the head and neck was performed using the standard protocol during bolus administration of intravenous contrast. Multiplanar CT image reconstructions and MIPs were obtained to evaluate the vascular anatomy. Carotid stenosis measurements (when applicable) are obtained utilizing NASCET criteria, using the distal internal carotid diameter as the denominator. Multiphase CT imaging of the brain was performed following IV bolus contrast injection. Subsequent parametric perfusion maps were calculated using RAPID software. CONTRAST:  ISOVUE-370 IOPAMIDOL (ISOVUE-370) INJECTION 76% COMPARISON:  CT HEAD February 02, 2018 and MRI head June 09, 2015 FINDINGS: CTA NECK FINDINGS: AORTIC ARCH: Normal appearance of the thoracic arch, normal branch pattern. Mild calcific atherosclerosis. The origins of the innominate, left Common carotid artery and subclavian artery are widely patent. RIGHT CAROTID SYSTEM: Common carotid artery is patent. Normal appearance of the carotid bifurcation without hemodynamically significant stenosis by NASCET criteria. Normal appearance of the internal carotid artery. LEFT CAROTID SYSTEM: Common carotid artery is patent. Normal appearance of the carotid bifurcation without hemodynamically significant stenosis by NASCET criteria. Normal appearance of the internal carotid artery. VERTEBRAL ARTERIES:Left vertebral artery is dominant. Normal appearance of the vertebral arteries, widely patent. SKELETON: No acute osseous process though bone windows have not been submitted. Mild old upper thoracic compression fractures. Osteopenia. OTHER NECK: Soft tissues of the neck are nonacute though, not tailored for evaluation. Minimal intravenous subcutaneous gas consistent with recent intravascular access UPPER CHEST: Included lung apices are clear. No superior mediastinal lymphadenopathy. CTA HEAD FINDINGS:  ANTERIOR CIRCULATION: Patent cervical internal carotid arteries, petrous, cavernous and supra clinoid internal carotid arteries. Patent anterior communicating artery. Patent anterior and middle cerebral arteries. No large vessel occlusion, significant stenosis, contrast extravasation or aneurysm. POSTERIOR CIRCULATION: Patent vertebral arteries, vertebrobasilar junction and basilar artery, as well as main branch vessels. Blister aneurysm LEFT V4 segment. Patent posterior cerebral arteries. Small bilateral posterior communicating arteries present. No large vessel occlusion, significant stenosis, contrast extravasation or aneurysm. VENOUS SINUSES: Major dural venous sinuses are patent though not tailored for evaluation on this angiographic examination. ANATOMIC VARIANTS: Hypoplastic RIGHT A1 segment. DELAYED PHASE: Not performed. Review of the MIP images confirms the above findings CT Brain Perfusion Findings (mild motion degraded examination): CBF (<30%)  Volume: 0mL Perfusion (Tmax>6.0s) volume: 0mL Mismatch Volume: 0mL Infarction Location:None. IMPRESSION: CTA NECK: 1. No hemodynamically significant stenosis ICA's. No acute vascular process. 2. Patent vertebral arteries. CTA HEAD: 1. No emergent large vessel occlusion or flow-limiting stenosis. Complete circle-of-Willis. CT PERFUSION: 1. No perfusion abnormality on this mildly motion degraded examination. Aortic Atherosclerosis (ICD10-I70.0). Electronically Signed   By: Awilda Metroourtnay  Bloomer M.D.   On: 02/02/2018 23:02   Ct Angio Neck W Or Wo Contrast  Result Date: 02/02/2018 CLINICAL DATA:  Aphasic, LEFT sided weakness and facial droop. History of stroke, dementia and vertigo. EXAM: CT ANGIOGRAPHY HEAD AND NECK CT PERFUSION BRAIN TECHNIQUE: Multidetector CT imaging of the head and neck was performed using the standard protocol during bolus administration of intravenous contrast. Multiplanar CT image reconstructions and MIPs were obtained to evaluate the vascular  anatomy. Carotid stenosis measurements (when applicable) are obtained utilizing NASCET criteria, using the distal internal carotid diameter as the denominator. Multiphase CT imaging of the brain was performed following IV bolus contrast injection. Subsequent parametric perfusion maps were calculated using RAPID software. CONTRAST:  150mL ISOVUE-370 IOPAMIDOL (ISOVUE-370) INJECTION 76% COMPARISON:  CT HEAD February 02, 2018 and MRI head June 09, 2015 FINDINGS: CTA NECK FINDINGS: AORTIC ARCH: Normal appearance of the thoracic arch, normal branch pattern. Mild calcific atherosclerosis. The origins of the innominate, left Common carotid artery and subclavian artery are widely patent. RIGHT CAROTID SYSTEM: Common carotid artery is patent. Normal appearance of the carotid bifurcation without hemodynamically significant stenosis by NASCET criteria. Normal appearance of the internal carotid artery. LEFT CAROTID SYSTEM: Common carotid artery is patent. Normal appearance of the carotid bifurcation without hemodynamically significant stenosis by NASCET criteria. Normal appearance of the internal carotid artery. VERTEBRAL ARTERIES:Left vertebral artery is dominant. Normal appearance of the vertebral arteries, widely patent. SKELETON: No acute osseous process though bone windows have not been submitted. Mild old upper thoracic compression fractures. Osteopenia. OTHER NECK: Soft tissues of the neck are nonacute though, not tailored for evaluation. Minimal intravenous subcutaneous gas consistent with recent intravascular access UPPER CHEST: Included lung apices are clear. No superior mediastinal lymphadenopathy. CTA HEAD FINDINGS: ANTERIOR CIRCULATION: Patent cervical internal carotid arteries, petrous, cavernous and supra clinoid internal carotid arteries. Patent anterior communicating artery. Patent anterior and middle cerebral arteries. No large vessel occlusion, significant stenosis, contrast extravasation or aneurysm.  POSTERIOR CIRCULATION: Patent vertebral arteries, vertebrobasilar junction and basilar artery, as well as main branch vessels. Blister aneurysm LEFT V4 segment. Patent posterior cerebral arteries. Small bilateral posterior communicating arteries present. No large vessel occlusion, significant stenosis, contrast extravasation or aneurysm. VENOUS SINUSES: Major dural venous sinuses are patent though not tailored for evaluation on this angiographic examination. ANATOMIC VARIANTS: Hypoplastic RIGHT A1 segment. DELAYED PHASE: Not performed. Review of the MIP images confirms the above findings CT Brain Perfusion Findings (mild motion degraded examination): CBF (<30%) Volume: 0mL Perfusion (Tmax>6.0s) volume: 0mL Mismatch Volume: 0mL Infarction Location:None. IMPRESSION: CTA NECK: 1. No hemodynamically significant stenosis ICA's. No acute vascular process. 2. Patent vertebral arteries. CTA HEAD: 1. No emergent large vessel occlusion or flow-limiting stenosis. Complete circle-of-Willis. CT PERFUSION: 1. No perfusion abnormality on this mildly motion degraded examination. Aortic Atherosclerosis (ICD10-I70.0). Electronically Signed   By: Awilda Metroourtnay  Bloomer M.D.   On: 02/02/2018 23:02   Mr Brain Wo Contrast  Result Date: 02/03/2018 CLINICAL DATA:  Stroke follow-up, status post tPA administration. EXAM: MRI HEAD WITHOUT CONTRAST TECHNIQUE: Multiplanar, multiecho pulse sequences of the brain and surrounding structures were obtained without intravenous contrast. COMPARISON:  None.  FINDINGS: BRAIN: There is no acute infarct, acute hemorrhage or mass effect. The midline structures are normal. There are no old infarcts. Diffuse confluent hyperintense T2-weighted signal within the periventricular, deep and juxtacortical white matter, most commonly due to chronic ischemic microangiopathy. Advanced brain parenchymal atrophy. Multiple enlarged perivascular spaces of the lentiform nuclei. Susceptibility-sensitive sequences show no  chronic microhemorrhage or superficial siderosis. VASCULAR: Major intracranial arterial and venous sinus flow voids are preserved. SKULL AND UPPER CERVICAL SPINE: The visualized skull base, calvarium, upper cervical spine and extracranial soft tissues are normal. SINUSES/ORBITS: No fluid levels or advanced mucosal thickening. No mastoid or middle ear effusion. The orbits are normal. IMPRESSION: 1. No acute intracranial abnormality. 2. Severe brain volume loss and chronic microvascular disease. Electronically Signed   By: Deatra Robinson M.D.   On: 02/03/2018 20:57   Ct Cerebral Perfusion W Contrast  Result Date: 02/02/2018 CLINICAL DATA:  Aphasic, LEFT sided weakness and facial droop. History of stroke, dementia and vertigo. EXAM: CT ANGIOGRAPHY HEAD AND NECK CT PERFUSION BRAIN TECHNIQUE: Multidetector CT imaging of the head and neck was performed using the standard protocol during bolus administration of intravenous contrast. Multiplanar CT image reconstructions and MIPs were obtained to evaluate the vascular anatomy. Carotid stenosis measurements (when applicable) are obtained utilizing NASCET criteria, using the distal internal carotid diameter as the denominator. Multiphase CT imaging of the brain was performed following IV bolus contrast injection. Subsequent parametric perfusion maps were calculated using RAPID software. CONTRAST:  ISOVUE-370 IOPAMIDOL (ISOVUE-370) INJECTION 76% COMPARISON:  CT HEAD February 02, 2018 and MRI head June 09, 2015 FINDINGS: CTA NECK FINDINGS: AORTIC ARCH: Normal appearance of the thoracic arch, normal branch pattern. Mild calcific atherosclerosis. The origins of the innominate, left Common carotid artery and subclavian artery are widely patent. RIGHT CAROTID SYSTEM: Common carotid artery is patent. Normal appearance of the carotid bifurcation without hemodynamically significant stenosis by NASCET criteria. Normal appearance of the internal carotid artery. LEFT CAROTID  SYSTEM: Common carotid artery is patent. Normal appearance of the carotid bifurcation without hemodynamically significant stenosis by NASCET criteria. Normal appearance of the internal carotid artery. VERTEBRAL ARTERIES:Left vertebral artery is dominant. Normal appearance of the vertebral arteries, widely patent. SKELETON: No acute osseous process though bone windows have not been submitted. Mild old upper thoracic compression fractures. Osteopenia. OTHER NECK: Soft tissues of the neck are nonacute though, not tailored for evaluation. Minimal intravenous subcutaneous gas consistent with recent intravascular access UPPER CHEST: Included lung apices are clear. No superior mediastinal lymphadenopathy. CTA HEAD FINDINGS: ANTERIOR CIRCULATION: Patent cervical internal carotid arteries, petrous, cavernous and supra clinoid internal carotid arteries. Patent anterior communicating artery. Patent anterior and middle cerebral arteries. No large vessel occlusion, significant stenosis, contrast extravasation or aneurysm. POSTERIOR CIRCULATION: Patent vertebral arteries, vertebrobasilar junction and basilar artery, as well as main branch vessels. Blister aneurysm LEFT V4 segment. Patent posterior cerebral arteries. Small bilateral posterior communicating arteries present. No large vessel occlusion, significant stenosis, contrast extravasation or aneurysm. VENOUS SINUSES: Major dural venous sinuses are patent though not tailored for evaluation on this angiographic examination. ANATOMIC VARIANTS: Hypoplastic RIGHT A1 segment. DELAYED PHASE: Not performed. Review of the MIP images confirms the above findings CT Brain Perfusion Findings (mild motion degraded examination): CBF (<30%) Volume: 0mL Perfusion (Tmax>6.0s) volume: 0mL Mismatch Volume: 0mL Infarction Location:None. IMPRESSION: CTA NECK: 1. No hemodynamically significant stenosis ICA's. No acute vascular process. 2. Patent vertebral arteries. CTA HEAD: 1. No emergent large  vessel occlusion or flow-limiting stenosis. Complete circle-of-Willis. CT PERFUSION: 1.  No perfusion abnormality on this mildly motion degraded examination. Aortic Atherosclerosis (ICD10-I70.0). Electronically Signed   By: Awilda Metro M.D.   On: 02/02/2018 23:02   Ct Head Code Stroke Wo Contrast  Result Date: 02/02/2018 CLINICAL DATA:  Code stroke. Initial evaluation for acute aphasia, left-sided weakness and facial droop. EXAM: CT HEAD WITHOUT CONTRAST TECHNIQUE: Contiguous axial images were obtained from the base of the skull through the vertex without intravenous contrast. COMPARISON:  Prior MRI from 06/09/2015. FINDINGS: Brain: Advanced age-related cerebral atrophy. Moderate chronic small vessel ischemic disease. No acute intracranial hemorrhage. No acute large vessel territory infarct. No mass lesion, midline shift or mass effect. No hydrocephalus. No extra-axial fluid collection. Vascular: No hyperdense vessel. Calcified atherosclerosis at the skull base. Skull: Scalp soft tissues and calvarium demonstrate no acute abnormality. Sinuses/Orbits: Globes and orbital soft tissues demonstrate no acute abnormality. Paranasal sinuses and mastoid air cells are clear. Other: None. ASPECTS [facility] Stroke Program Early CT Score) - Ganglionic level infarction (caudate, lentiform nuclei, internal capsule, insula, M1-M3 cortex): 7 - Supraganglionic infarction (M4-M6 cortex): 3 Total score (0-10 with 10 being normal): 10 IMPRESSION: 1. No acute intracranial infarct or other abnormality identified. 2. ASPECTS is 10. 3. Advanced age-related cerebral atrophy with moderate chronic small vessel ischemic disease. Critical Value/emergent results were called by telephone at the time of interpretation on 02/02/2018 at 9:36 pm to Dr. Donnetta Hutching , who verbally acknowledged these results. Electronically Signed   By: Rise Mu M.D.   On: 02/02/2018 21:37    Assessment/Plan  #1- history of speech difficulties- she  did go to the ER- MRI was negative for CVA-EEG negative for seizures-she appears to be back at her baseline- she continues on aspirin again not thought a candidate for aggressive intervention with statin.  2.  Dementia she continues on Aricept and Namenda she appears to be back at her baseline today at this point continue supportive care.  3.-  Hypertension- blood pressure today appears improved at 130/80 at times her blood pressure was up a  bit but I do not see consistent elevations at this point monitor  #4 history of hypernatremia with failure to thrive- as noted in previous routine visit her weight appears to have stabilized somewhat she is on Remeron-her sodium has been stable on recent labs in the 140s at this point continue to monitor  [phone]

## 2018-02-04 NOTE — Discharge Instructions (Signed)
Resume facility meds and care

## 2018-02-04 NOTE — Progress Notes (Signed)
NURSING PROGRESS NOTE  Evelyn HutchingMildred Riehle V. [id number] Discharge Data: 01/20/2018 12:52 PM Attending Provider: Marvel PlanXu, Jindong, MD WUJ:WJXBJPCP:Gupta, Freddie BreechAnjali L, MD     Evelyn HutchingMildred Parrillo V. to be D/C'd Skilled nursing facility  [facility]per MD order.  Discussed with the patient the After Visit Summary and all questions fully answered. All IV's discontinued with no bleeding noted. All belongings returned to patient for patient to take home.   Last Vital Signs:  Blood pressure 135/68, pulse 81, temperature 97.8 F (36.6 C), temperature source Oral, resp. rate 18, height 5\' 2"  (1.575 m), weight 47.9 kg, SpO2 100 %.  Discharge Medication List Allergies as of 02/10/2018      Reactions   Beef-derived Products    Unknown reaction-Listed on MAR      Medication List    TAKE these medications   acetaminophen 325 MG tablet Commonly known as:  TYLENOL Take 2 tablets (650 mg total) by mouth every 6 (six) hours as needed for mild pain (or Fever >/= 101).   aspirin 325 MG EC tablet Take 1 tablet (325 mg total) by mouth daily with breakfast.   calcium carbonate 1250 (500 Ca) MG tablet Commonly known as:  OS-CAL - dosed in mg of elemental calcium Take 1 tablet by mouth 2 (two) times daily with a meal.   Cholecalciferol 1000 units tablet Take 2,000 Units by mouth every morning.   donepezil 5 MG tablet Commonly known as:  ARICEPT Take 5 mg by mouth at bedtime.   memantine 5 MG tablet Commonly known as:  NAMENDA Take 5 mg by mouth 2 (two) times daily.   mirtazapine 15 MG tablet Commonly known as:  REMERON Take 7.5 mg by mouth at bedtime.   senna-docusate 8.6-50 MG tablet Commonly known as:  Senokot-S Take 1 tablet by mouth 2 (two) times daily.   sertraline 25 MG tablet Commonly known as:  ZOLOFT Take 25 mg by mouth every morning.   triamcinolone 0.025 % cream Commonly known as:  KENALOG Apply 1 application topically 2 (two) times daily as needed (for itching/rash).

## 2018-02-04 NOTE — Progress Notes (Addendum)
  Speech Language Pathology Treatment: Dysphagia  Patient Details Name: Evelyn HutchingMildred Flowers V. MRN: [id number] DOB: [birth year] Today's Date: September 30, 2017 Time: 1030-1050 SLP Time Calculation (min) (ACUTE ONLY): 20 min  Assessment / Plan / Recommendation Clinical Impression  Pt seen at bedside for follow up after BSE completed 02/03/18. MRI indicates neither acute nor old infarcts.   Pt was observed eating ice cream. Intermittent congested, nonproductive cough was noted during this session, not necessarily following po trials. Pt's daughter indicates is a common occurrence, but pt appears to be tolerating and enjoying advanced (dys2) diet. Pt is afebrile, lungs documented as clear/diminished. Pt scheduled to DC to SNF today. Recommend brief ST follow up for assessment of diet tolerance and education. Daughter verbalizes awareness of safe swallow precautions, and was encouraged to take safe swallow precaution sheet with pt at DC.     HPI HPI: 77 year old female admitted 02/02/18 with aphasia and left sided weakness. PMH: dysarthria, acute encephalopathy, lightheadedness, malnutrition, dehydration, diarrhea. Daughter reports gradual decline in speech and language skills, raising suspicion for Primary Progressive Aphasia.       SLP Plan  Continue with current plan of care if not discharged.        Recommendations  Diet recommendations: Dysphagia 2 (fine chop);Thin liquid Liquids provided via: Cup;Straw Medication Administration: Crushed with puree Supervision: Staff to assist with self feeding;Full supervision/cueing for compensatory strategies Compensations: Minimize environmental distractions;Slow rate;Small sips/bites;Lingual sweep for clearance of pocketing Postural Changes and/or Swallow Maneuvers: Seated upright 90 degrees;Upright 30-60 min after meal                Oral Care Recommendations: Oral care QID Follow up Recommendations: Skilled Nursing facility;24 hour  supervision/assistance SLP Visit Diagnosis: Dysphagia, unspecified (R13.10);Cognitive communication deficit (W29.562(R41.841) Plan: Continue with current plan of care       GO                Evelyn Flowers, Evelyn Flowers September 30, 2017, 11:38 AM

## 2018-02-04 NOTE — Evaluation (Signed)
Physical Therapy Evaluation Patient Details Name: Evelyn Flowers MRN: [id number] DOB: 1940/10/04 Today's Date: 02/11/2018   History of Present Illness  Evelyn Flowers is an 77 y.o. female past medical history of dementia, malnutrition, prior stroke who presented to do [name] sudden onset aphasia.  Imaging showed no signs acute stroke.  Clinical Impression  Pt returning to SNF today.  They should be able to assist her for mobility as usual.  She does appear like she would benefit from some reconditioning and work on transfer technique work before switching to restorative care.     Follow Up Recommendations SNF;Other (comment)(ST to assist with pt's transitions/ overall burden of care)    Equipment Recommendations  None recommended by PT    Recommendations for Other Services       Precautions / Restrictions Precautions Precautions: Fall      Mobility  Bed Mobility Overal bed mobility: Needs Assistance Bed Mobility: Supine to Sit     Supine to sit: Mod assist     General bed mobility comments: cues and hand over hand assist for UE's to get some assist from the patient  Transfers Overall transfer level: Needs assistance Equipment used: None Transfers: Sit to/from [employer] Sit to Stand: Mod assist   Squat pivot transfers: Mod assist;Max assist     General transfer comment: If taken slow enough, pt can assist more.  minimal pivot steps taken  Ambulation/Gait             General Gait Details: not able  Stairs            Wheelchair Mobility    Modified Rankin (Stroke Patients Only) Modified Rankin (Stroke Patients Only) Pre-Morbid Rankin Score: Severe disability Modified Rankin: Severe disability     Balance Overall balance assessment: Needs assistance Sitting-balance support: Single extremity supported;No upper extremity supported Sitting balance-Leahy Scale: Fair       Standing balance-Leahy Scale:  Poor Standing balance comment: needs external support                             Pertinent Vitals/Pain Pain Assessment: No/denies pain Faces Pain Scale: No hurt    Home Living Family/patient expects to be discharged to:: Skilled nursing facility                      Prior Function Level of Independence: Needs assistance   Gait / Transfers Assistance Needed: squat pivot transfers assisted by staff  ADL's / Homemaking Assistance Needed: pt can feed herself after set up, all other ADL's are assist        Hand Dominance   Dominant Hand: Right    Extremity/Trunk Assessment   Upper Extremity Assessment Upper Extremity Assessment: Generalized weakness    Lower Extremity Assessment Lower Extremity Assessment: Generalized weakness(L generally weaker than right, less isolation. )       Communication   Communication: Expressive difficulties  Cognition Arousal/Alertness: Awake/alert Behavior During Therapy: WFL for tasks assessed/performed Overall Cognitive Status: Difficult to assess                                        General Comments      Exercises     Assessment/Plan    PT Assessment All further PT needs can be met in the next venue of care  PT Problem List Decreased strength;Decreased activity tolerance;Decreased balance;Decreased mobility       PT Treatment Interventions      PT Goals (Current goals can be found in the Care Plan section)  Acute Rehab PT Goals PT Goal Formulation: All assessment and education complete, DC therapy    Frequency     Barriers to discharge        Co-evaluation               AM-PAC PT "6 Clicks" Daily Activity  Outcome Measure Difficulty turning over in bed (including adjusting bedclothes, sheets and blankets)?: Unable Difficulty moving from lying on back to sitting on the side of the bed? : Unable Difficulty sitting down on and standing up from a chair with arms (e.g.,  wheelchair, bedside commode, etc,.)?: Unable Help needed moving to and from a bed to chair (including a wheelchair)?: A Lot Help needed walking in hospital room?: Total Help needed climbing 3-5 steps with a railing? : Total 6 Click Score: 7    End of Session   Activity Tolerance: Patient tolerated treatment well Patient left: in chair;with call bell/phone within reach;with chair alarm set;with family/visitor present Nurse Communication: Mobility status PT Visit Diagnosis: Other abnormalities of gait and mobility (R26.89);Hemiplegia and hemiparesis;Muscle weakness (generalized) (M62.81) Hemiplegia - caused by: Unspecified    Time: 1010-1049 PT Time Calculation (min) (ACUTE ONLY): 39 min   Charges:   PT Evaluation $PT Eval Moderate Complexity: 1 Mod PT Treatments $Therapeutic Activity: 23-37 mins        02/08/2018  Donnella Sham, PT [phone]  (pager)  Tessie Fass Tanisia Yokley 01/31/2018, 11:24 AM

## 2018-02-06 ENCOUNTER — Encounter: Payer: Self-pay | Admitting: Internal Medicine

## 2018-02-08 ENCOUNTER — Non-Acute Institutional Stay (SKILLED_NURSING_FACILITY): Payer: Medicare Other | Admitting: Internal Medicine

## 2018-02-08 ENCOUNTER — Encounter: Payer: Self-pay | Admitting: Internal Medicine

## 2018-02-08 DIAGNOSIS — R531 Weakness: Secondary | ICD-10-CM | POA: Diagnosis not present

## 2018-02-08 DIAGNOSIS — R293 Abnormal posture: Secondary | ICD-10-CM | POA: Diagnosis not present

## 2018-02-08 DIAGNOSIS — I639 Cerebral infarction, unspecified: Secondary | ICD-10-CM | POA: Diagnosis not present

## 2018-02-08 DIAGNOSIS — R633 Feeding difficulties: Secondary | ICD-10-CM | POA: Diagnosis not present

## 2018-02-08 DIAGNOSIS — E870 Hyperosmolality and hypernatremia: Secondary | ICD-10-CM

## 2018-02-08 DIAGNOSIS — F339 Major depressive disorder, recurrent, unspecified: Secondary | ICD-10-CM | POA: Diagnosis not present

## 2018-02-08 DIAGNOSIS — M810 Age-related osteoporosis without current pathological fracture: Secondary | ICD-10-CM

## 2018-02-08 DIAGNOSIS — F0390 Unspecified dementia without behavioral disturbance: Secondary | ICD-10-CM

## 2018-02-08 DIAGNOSIS — Z9181 History of falling: Secondary | ICD-10-CM | POA: Diagnosis not present

## 2018-02-08 DIAGNOSIS — R1312 Dysphagia, oropharyngeal phase: Secondary | ICD-10-CM | POA: Diagnosis not present

## 2018-02-08 NOTE — Progress Notes (Signed)
Provider:  Dr. Einar Crow Location:  [facility] Room Number: [phone] Place of Service:  SNF (31)  PCP: Mahlon Gammon, MD Patient Care Team: Mahlon Gammon, MD as PCP - General (Internal Medicine) Roena Malady, PA-C as Physician Assistant (Internal Medicine)  Extended Emergency Contact Information Primary Emergency Contact: [facility] Address: [address] [place], [place] [address] [place] Home Phone: [phone] Mobile Phone: [phone] Relation: Daughter Secondary Emergency Contact: Gena Fray [place] of [place] Mobile Phone: [phone] Relation: Son  Code Status: DNR Goals of Care: Advanced Directive information Advanced Directives 02/14/2018  Does Patient Have a Medical Advance Directive? Yes  Type of Advance Directive Out of facility DNR (pink MOST or yellow form)  Does patient want to make changes to medical advance directive? No - Patient declined  Copy of Healthcare Power of Attorney in Chart? No - copy requested  Would patient like information on creating a medical advance directive? No - Patient declined  Pre-existing out of facility DNR order (yellow form or pink MOST form) -      Chief Complaint  Patient presents with  . Readmit To SNF    Readmit to [facility]    HPI: Patient is a 77 y.o. female seen today for readmission to [facility] Nursing. Patient was admitted to hospital from 08/14-08/16 For Possible Stroke.  She has h/o Right Femoral Fracture due to fall, Depression and End stage  dementia., Osteoporosisand Hypernatremia with Failure to thrive. Patient was send to the ED for Aphasia and ? Left sided weakness as noticed by her Nurses. Patient at baseline is aphasic and does not follow commands all the time. She was evaluated by Stroke team and was given TPA. Her MRI was negative for any new or Old Stroke. It did show Brain Volume loss with Chronic Vascular Changes. CTA was also  negative for any stenosis.  She was restarted on Aspirin and send back to facility  Past Medical History:  Diagnosis Date  . Acute encephalopathy 2012   In context of diarrhea and clinical dehydration  . Dementia    Noncompliant with Aricept as per daughter  . Protein-calorie malnutrition, severe (HCC)   . Stroke (HCC)   . Underweight   . Vertigo    Past Surgical History:  Procedure Laterality Date  . COMPRESSION HIP SCREW Right 09/21/2015   Procedure: COMPRESSION HIP;  Surgeon: Vickki Hearing, MD;  Location: [facility];  Service: Orthopedics;  Laterality: Right;    reports that she has never smoked. She has never used smokeless tobacco. She reports that she drinks alcohol. She reports that she does not use drugs. Social History   Socioeconomic History  . Marital status: Widowed    Spouse name: Not on file  . Number of children: Not on file  . Years of education: Not on file  . Highest education level: Not on file  Occupational History  . Not on file  Social Needs  . Financial resource strain: Not on file  . Food insecurity:    Worry: Not on file    Inability: Not on file  . Transportation needs:    Medical: Not on file    Non-medical: Not on file  Tobacco Use  . Smoking status: Never Smoker  . Smokeless tobacco: Never Used  Substance and Sexual Activity  . Alcohol use: Yes    Comment: occasionally  . Drug use: No  .  Sexual activity: Never  Lifestyle  . Physical activity:    Days per week: Not on file    Minutes per session: Not on file  . Stress: Not on file  Relationships  . Social connections:    Talks on phone: Not on file    Gets together: Not on file    Attends religious service: Not on file    Active member of club or organization: Not on file    Attends meetings of clubs or organizations: Not on file    Relationship status: Not on file  . Intimate partner violence:    Fear of current or ex partner: Not on file    Emotionally abused: Not on file     Physically abused: Not on file    Forced sexual activity: Not on file  Other Topics Concern  . Not on file  Social History Narrative  . Not on file    Functional Status Survey:    Family History  Problem Relation Age of Onset  . Dementia Mother   . Cancer Paternal Grandmother        of upper extremity  . Heart disease Neg Hx   . Diabetes Neg Hx   . Stroke Neg Hx     Health Maintenance  Topic Date Due  . INFLUENZA VACCINE  03/04/2018 (Originally 01/20/2018)  . TETANUS/TDAP  03/05/2027  . DEXA SCAN  Completed  . PNA vac Low Risk Adult  Completed    Allergies  Allergen Reactions  . Beef-Derived Products     Unknown reaction-[facility]ed [facility]    Outpatient Encounter Medications as of 02/08/2018  Medication Sig  . acetaminophen (TYLENOL) 325 MG tablet Take 2 tablets (650 mg total) by mouth every 6 (six) hours as needed for mild pain (or Fever >/= 101).  Marland Kitchen. aspirin EC 325 MG EC tablet Take 1 tablet (325 mg total) by mouth daily with breakfast.  . calcium carbonate (OS-CAL - DOSED IN MG OF ELEMENTAL CALCIUM) 1250 (500 Ca) MG tablet Take 1 tablet by mouth 2 (two) times daily with a meal.  . Cholecalciferol 1000 units tablet Take 2,000 Units by mouth every morning.   . donepezil (ARICEPT) 5 MG tablet Take 5 mg by mouth at bedtime.  . memantine (NAMENDA) 5 MG tablet Take 5 mg by mouth 2 (two) times daily.  . mirtazapine (REMERON) 15 MG tablet Take 7.5 mg by mouth at bedtime.   . senna-docusate (SENOKOT-S) 8.6-50 MG tablet Take 1 tablet by mouth 2 (two) times daily.  . sertraline (ZOLOFT) 25 MG tablet Take 25 mg by mouth every morning.   . triamcinolone (KENALOG) 0.025 % cream Apply 1 application topically 2 (two) times daily as needed (for itching/rash).    No facility-administered encounter medications on file as of 02/08/2018.     Review of Systems  Unable to perform ROS: Dementia    Vitals:   02/08/18 1108  BP: 130/80  Pulse: 72  Resp: 16  Temp: 98.6 F (37 C)  TempSrc:  Oral  Weight: 105 lb 12.8 oz (48 kg)  Height: 5\' 2"  (1.575 m)   Body mass index is 19.35 kg/m. Physical Exam  Constitutional: She appears well-developed.  HENT:  Head: Normocephalic.  Mouth/Throat: Oropharynx is clear and moist.  Eyes: Pupils are equal, round, and reactive to light.  Neck: Neck supple.  Cardiovascular: Normal rate and regular rhythm.  No murmur heard. Pulmonary/Chest: Effort normal and breath sounds normal. No stridor. No respiratory distress. She has no  wheezes.  Musculoskeletal: She exhibits edema.  Mild edema  Lymphadenopathy:    She has no cervical adenopathy.  Neurological: She is alert.  Patient continues to be Aphasic. Is moving all her Extremities. Alert and respond to some commands not everytime. Seems at her Baseline  Skin: Skin is warm and dry.  Psychiatric: She has a normal mood and affect. Her behavior is normal.    Labs reviewed: Basic Metabolic Panel: Recent Labs    12/24/17 0415 02/02/18 0700 02/02/18 2131 02/02/18 2136  NA 143 142 143 144  K 3.5 3.7 3.5 3.6  CL 108 105 106 102  CO2 30 29 32  --   GLUCOSE 86 125* 112* 106*  BUN 13 17 21 21   CREATININE 0.65 0.72 0.72 0.80  CALCIUM 8.9 9.0 9.4  --    Liver Function Tests: Recent Labs    11/11/17 0708 12/24/17 0415 02/02/18 2131  AST 27 28 42*  ALT 29 26 35  ALKPHOS 61 53 60  BILITOT 0.7 0.4 0.7  PROT 6.9 5.9* 6.8  ALBUMIN 4.0 3.3* 3.9   No results for input(s): LIPASE, AMYLASE in the last 8760 hours. No results for input(s): AMMONIA in the last 8760 hours. CBC: Recent Labs    12/24/17 0415 02/02/18 0700 02/02/18 2131 02/02/18 2136  WBC 5.1 5.3 5.8  --   NEUTROABS 1.8 3.3 3.2  --   HGB 12.0 13.6 13.2 13.6  HCT 37.3 41.6 40.9 40.0  MCV 96.9 97.2 96.5  --   PLT 178 164 164  --    Cardiac Enzymes: No results for input(s): CKTOTAL, CKMB, CKMBINDEX, TROPONINI in the last 8760 hours. BNP: Invalid input(s): POCBNP Lab Results  Component Value Date   HGBA1C 5.2  02/03/2018   Lab Results  Component Value Date   TSH 2.391 02/19/2017   Lab Results  Component Value Date   VITAMINB12 252 06/09/2015   No results found for: FOLATE No results found for: IRON, TIBC, FERRITIN  Imaging and Procedures obtained prior to SNF admission: No results found.  Assessment/Plan ? Acute Stroke/TIA Patient did receive TPA. But her additional testing was negative for any signs of stroke. She is back to her baseline. Continue on Aspirin. BP controlled LDL less then 100  Dementia with failure to thrive Patient continues to loose weight. She is on Remeron  And Zoloft and supplements Continue on Namenda and Aricept Seems back to her baseline MOST form in the Chart for no Aggressive treatment per daughter Osteoporosis On Vit D and Calcium. Patient did not tolerate Fosamax. If her weight stabilize will consider Prolia Depression Continue on Zoloft and Remeron  Family/ staff Communication:   Labs/tests ordered: Total time spent in this patient care encounter was 45_ minutes; greater than 50% of the visit spent counseling patient, reviewing records , Labs and coordinating care for problems addressed at this encounter.

## 2018-02-09 DIAGNOSIS — R633 Feeding difficulties: Secondary | ICD-10-CM | POA: Diagnosis not present

## 2018-02-09 DIAGNOSIS — R293 Abnormal posture: Secondary | ICD-10-CM | POA: Diagnosis not present

## 2018-02-09 DIAGNOSIS — Z9181 History of falling: Secondary | ICD-10-CM | POA: Diagnosis not present

## 2018-02-09 DIAGNOSIS — M810 Age-related osteoporosis without current pathological fracture: Secondary | ICD-10-CM | POA: Diagnosis not present

## 2018-02-09 DIAGNOSIS — F0390 Unspecified dementia without behavioral disturbance: Secondary | ICD-10-CM | POA: Diagnosis not present

## 2018-02-09 DIAGNOSIS — R531 Weakness: Secondary | ICD-10-CM | POA: Diagnosis not present

## 2018-02-10 DIAGNOSIS — M810 Age-related osteoporosis without current pathological fracture: Secondary | ICD-10-CM | POA: Diagnosis not present

## 2018-02-10 DIAGNOSIS — R293 Abnormal posture: Secondary | ICD-10-CM | POA: Diagnosis not present

## 2018-02-10 DIAGNOSIS — R531 Weakness: Secondary | ICD-10-CM | POA: Diagnosis not present

## 2018-02-10 DIAGNOSIS — R633 Feeding difficulties: Secondary | ICD-10-CM | POA: Diagnosis not present

## 2018-02-10 DIAGNOSIS — Z9181 History of falling: Secondary | ICD-10-CM | POA: Diagnosis not present

## 2018-02-10 DIAGNOSIS — F0390 Unspecified dementia without behavioral disturbance: Secondary | ICD-10-CM | POA: Diagnosis not present

## 2018-02-11 DIAGNOSIS — Z9181 History of falling: Secondary | ICD-10-CM | POA: Diagnosis not present

## 2018-02-11 DIAGNOSIS — R633 Feeding difficulties: Secondary | ICD-10-CM | POA: Diagnosis not present

## 2018-02-11 DIAGNOSIS — R531 Weakness: Secondary | ICD-10-CM | POA: Diagnosis not present

## 2018-02-11 DIAGNOSIS — M810 Age-related osteoporosis without current pathological fracture: Secondary | ICD-10-CM | POA: Diagnosis not present

## 2018-02-11 DIAGNOSIS — F0390 Unspecified dementia without behavioral disturbance: Secondary | ICD-10-CM | POA: Diagnosis not present

## 2018-02-11 DIAGNOSIS — R293 Abnormal posture: Secondary | ICD-10-CM | POA: Diagnosis not present

## 2018-02-14 DIAGNOSIS — Z9181 History of falling: Secondary | ICD-10-CM | POA: Diagnosis not present

## 2018-02-14 DIAGNOSIS — R531 Weakness: Secondary | ICD-10-CM | POA: Diagnosis not present

## 2018-02-14 DIAGNOSIS — R293 Abnormal posture: Secondary | ICD-10-CM | POA: Diagnosis not present

## 2018-02-14 DIAGNOSIS — F0390 Unspecified dementia without behavioral disturbance: Secondary | ICD-10-CM | POA: Diagnosis not present

## 2018-02-14 DIAGNOSIS — R633 Feeding difficulties: Secondary | ICD-10-CM | POA: Diagnosis not present

## 2018-02-14 DIAGNOSIS — M810 Age-related osteoporosis without current pathological fracture: Secondary | ICD-10-CM | POA: Diagnosis not present

## 2018-02-15 DIAGNOSIS — F0390 Unspecified dementia without behavioral disturbance: Secondary | ICD-10-CM | POA: Diagnosis not present

## 2018-02-15 DIAGNOSIS — R633 Feeding difficulties: Secondary | ICD-10-CM | POA: Diagnosis not present

## 2018-02-15 DIAGNOSIS — R293 Abnormal posture: Secondary | ICD-10-CM | POA: Diagnosis not present

## 2018-02-15 DIAGNOSIS — R531 Weakness: Secondary | ICD-10-CM | POA: Diagnosis not present

## 2018-02-15 DIAGNOSIS — Z9181 History of falling: Secondary | ICD-10-CM | POA: Diagnosis not present

## 2018-02-15 DIAGNOSIS — M810 Age-related osteoporosis without current pathological fracture: Secondary | ICD-10-CM | POA: Diagnosis not present

## 2018-02-16 DIAGNOSIS — R531 Weakness: Secondary | ICD-10-CM | POA: Diagnosis not present

## 2018-02-16 DIAGNOSIS — M810 Age-related osteoporosis without current pathological fracture: Secondary | ICD-10-CM | POA: Diagnosis not present

## 2018-02-16 DIAGNOSIS — F0390 Unspecified dementia without behavioral disturbance: Secondary | ICD-10-CM | POA: Diagnosis not present

## 2018-02-16 DIAGNOSIS — R293 Abnormal posture: Secondary | ICD-10-CM | POA: Diagnosis not present

## 2018-02-16 DIAGNOSIS — Z9181 History of falling: Secondary | ICD-10-CM | POA: Diagnosis not present

## 2018-02-16 DIAGNOSIS — R633 Feeding difficulties: Secondary | ICD-10-CM | POA: Diagnosis not present

## 2018-02-17 DIAGNOSIS — F0390 Unspecified dementia without behavioral disturbance: Secondary | ICD-10-CM | POA: Diagnosis not present

## 2018-02-17 DIAGNOSIS — Z9181 History of falling: Secondary | ICD-10-CM | POA: Diagnosis not present

## 2018-02-17 DIAGNOSIS — R531 Weakness: Secondary | ICD-10-CM | POA: Diagnosis not present

## 2018-02-17 DIAGNOSIS — M810 Age-related osteoporosis without current pathological fracture: Secondary | ICD-10-CM | POA: Diagnosis not present

## 2018-02-17 DIAGNOSIS — R293 Abnormal posture: Secondary | ICD-10-CM | POA: Diagnosis not present

## 2018-02-17 DIAGNOSIS — R633 Feeding difficulties: Secondary | ICD-10-CM | POA: Diagnosis not present

## 2018-02-18 DIAGNOSIS — R293 Abnormal posture: Secondary | ICD-10-CM | POA: Diagnosis not present

## 2018-02-18 DIAGNOSIS — R633 Feeding difficulties: Secondary | ICD-10-CM | POA: Diagnosis not present

## 2018-02-18 DIAGNOSIS — M810 Age-related osteoporosis without current pathological fracture: Secondary | ICD-10-CM | POA: Diagnosis not present

## 2018-02-18 DIAGNOSIS — R531 Weakness: Secondary | ICD-10-CM | POA: Diagnosis not present

## 2018-02-18 DIAGNOSIS — F0390 Unspecified dementia without behavioral disturbance: Secondary | ICD-10-CM | POA: Diagnosis not present

## 2018-02-18 DIAGNOSIS — Z9181 History of falling: Secondary | ICD-10-CM | POA: Diagnosis not present

## 2018-02-20 DEATH — deceased

## 2018-02-21 DIAGNOSIS — M810 Age-related osteoporosis without current pathological fracture: Secondary | ICD-10-CM | POA: Diagnosis not present

## 2018-02-21 DIAGNOSIS — F0390 Unspecified dementia without behavioral disturbance: Secondary | ICD-10-CM | POA: Diagnosis not present

## 2018-02-21 DIAGNOSIS — R531 Weakness: Secondary | ICD-10-CM | POA: Diagnosis not present

## 2018-02-21 DIAGNOSIS — R293 Abnormal posture: Secondary | ICD-10-CM | POA: Diagnosis not present

## 2018-02-21 DIAGNOSIS — R1312 Dysphagia, oropharyngeal phase: Secondary | ICD-10-CM | POA: Diagnosis not present

## 2018-02-21 DIAGNOSIS — R633 Feeding difficulties: Secondary | ICD-10-CM | POA: Diagnosis not present

## 2018-02-21 DIAGNOSIS — Z9181 History of falling: Secondary | ICD-10-CM | POA: Diagnosis not present

## 2018-02-22 DIAGNOSIS — Z9181 History of falling: Secondary | ICD-10-CM | POA: Diagnosis not present

## 2018-02-22 DIAGNOSIS — F0390 Unspecified dementia without behavioral disturbance: Secondary | ICD-10-CM | POA: Diagnosis not present

## 2018-02-22 DIAGNOSIS — R293 Abnormal posture: Secondary | ICD-10-CM | POA: Diagnosis not present

## 2018-02-22 DIAGNOSIS — R531 Weakness: Secondary | ICD-10-CM | POA: Diagnosis not present

## 2018-02-22 DIAGNOSIS — R633 Feeding difficulties: Secondary | ICD-10-CM | POA: Diagnosis not present

## 2018-02-22 DIAGNOSIS — M810 Age-related osteoporosis without current pathological fracture: Secondary | ICD-10-CM | POA: Diagnosis not present

## 2018-02-23 DIAGNOSIS — R633 Feeding difficulties: Secondary | ICD-10-CM | POA: Diagnosis not present

## 2018-02-23 DIAGNOSIS — Z9181 History of falling: Secondary | ICD-10-CM | POA: Diagnosis not present

## 2018-02-23 DIAGNOSIS — R293 Abnormal posture: Secondary | ICD-10-CM | POA: Diagnosis not present

## 2018-02-23 DIAGNOSIS — M810 Age-related osteoporosis without current pathological fracture: Secondary | ICD-10-CM | POA: Diagnosis not present

## 2018-02-23 DIAGNOSIS — F0390 Unspecified dementia without behavioral disturbance: Secondary | ICD-10-CM | POA: Diagnosis not present

## 2018-02-23 DIAGNOSIS — R531 Weakness: Secondary | ICD-10-CM | POA: Diagnosis not present

## 2018-02-24 DIAGNOSIS — R293 Abnormal posture: Secondary | ICD-10-CM | POA: Diagnosis not present

## 2018-02-24 DIAGNOSIS — R531 Weakness: Secondary | ICD-10-CM | POA: Diagnosis not present

## 2018-02-24 DIAGNOSIS — F0390 Unspecified dementia without behavioral disturbance: Secondary | ICD-10-CM | POA: Diagnosis not present

## 2018-02-24 DIAGNOSIS — M810 Age-related osteoporosis without current pathological fracture: Secondary | ICD-10-CM | POA: Diagnosis not present

## 2018-02-24 DIAGNOSIS — R633 Feeding difficulties: Secondary | ICD-10-CM | POA: Diagnosis not present

## 2018-02-24 DIAGNOSIS — Z9181 History of falling: Secondary | ICD-10-CM | POA: Diagnosis not present

## 2018-02-25 DIAGNOSIS — F0390 Unspecified dementia without behavioral disturbance: Secondary | ICD-10-CM | POA: Diagnosis not present

## 2018-02-25 DIAGNOSIS — R293 Abnormal posture: Secondary | ICD-10-CM | POA: Diagnosis not present

## 2018-02-25 DIAGNOSIS — R531 Weakness: Secondary | ICD-10-CM | POA: Diagnosis not present

## 2018-02-25 DIAGNOSIS — Z9181 History of falling: Secondary | ICD-10-CM | POA: Diagnosis not present

## 2018-02-25 DIAGNOSIS — R633 Feeding difficulties: Secondary | ICD-10-CM | POA: Diagnosis not present

## 2018-02-25 DIAGNOSIS — M810 Age-related osteoporosis without current pathological fracture: Secondary | ICD-10-CM | POA: Diagnosis not present

## 2018-02-28 ENCOUNTER — Other Ambulatory Visit (HOSPITAL_COMMUNITY): Payer: Self-pay | Admitting: Specialist

## 2018-02-28 DIAGNOSIS — F0390 Unspecified dementia without behavioral disturbance: Secondary | ICD-10-CM | POA: Diagnosis not present

## 2018-02-28 DIAGNOSIS — M810 Age-related osteoporosis without current pathological fracture: Secondary | ICD-10-CM | POA: Diagnosis not present

## 2018-02-28 DIAGNOSIS — R531 Weakness: Secondary | ICD-10-CM | POA: Diagnosis not present

## 2018-02-28 DIAGNOSIS — Z9181 History of falling: Secondary | ICD-10-CM | POA: Diagnosis not present

## 2018-02-28 DIAGNOSIS — R1319 Other dysphagia: Secondary | ICD-10-CM

## 2018-02-28 DIAGNOSIS — R293 Abnormal posture: Secondary | ICD-10-CM | POA: Diagnosis not present

## 2018-02-28 DIAGNOSIS — R633 Feeding difficulties: Secondary | ICD-10-CM | POA: Diagnosis not present

## 2018-03-02 DIAGNOSIS — R531 Weakness: Secondary | ICD-10-CM | POA: Diagnosis not present

## 2018-03-02 DIAGNOSIS — M810 Age-related osteoporosis without current pathological fracture: Secondary | ICD-10-CM | POA: Diagnosis not present

## 2018-03-02 DIAGNOSIS — Z9181 History of falling: Secondary | ICD-10-CM | POA: Diagnosis not present

## 2018-03-02 DIAGNOSIS — R633 Feeding difficulties: Secondary | ICD-10-CM | POA: Diagnosis not present

## 2018-03-02 DIAGNOSIS — R293 Abnormal posture: Secondary | ICD-10-CM | POA: Diagnosis not present

## 2018-03-02 DIAGNOSIS — F0390 Unspecified dementia without behavioral disturbance: Secondary | ICD-10-CM | POA: Diagnosis not present

## 2018-03-03 ENCOUNTER — Ambulatory Visit (HOSPITAL_COMMUNITY)
Admit: 2018-03-03 | Discharge: 2018-03-03 | Disposition: A | Discharge status: Home / Self Care | Payer: Medicare Other | Attending: Internal Medicine | Admitting: Internal Medicine

## 2018-03-03 ENCOUNTER — Encounter (HOSPITAL_COMMUNITY): Payer: Self-pay | Admitting: Speech Pathology

## 2018-03-03 ENCOUNTER — Other Ambulatory Visit: Payer: Self-pay

## 2018-03-03 ENCOUNTER — Ambulatory Visit (HOSPITAL_COMMUNITY): Payer: Medicare Other | Attending: Internal Medicine | Admitting: Speech Pathology

## 2018-03-03 DIAGNOSIS — E870 Hyperosmolality and hypernatremia: Secondary | ICD-10-CM | POA: Insufficient documentation

## 2018-03-03 DIAGNOSIS — M810 Age-related osteoporosis without current pathological fracture: Secondary | ICD-10-CM | POA: Diagnosis not present

## 2018-03-03 DIAGNOSIS — F329 Major depressive disorder, single episode, unspecified: Secondary | ICD-10-CM | POA: Diagnosis not present

## 2018-03-03 DIAGNOSIS — R627 Adult failure to thrive: Secondary | ICD-10-CM | POA: Diagnosis not present

## 2018-03-03 DIAGNOSIS — R296 Repeated falls: Secondary | ICD-10-CM | POA: Diagnosis not present

## 2018-03-03 DIAGNOSIS — E559 Vitamin D deficiency, unspecified: Secondary | ICD-10-CM | POA: Diagnosis not present

## 2018-03-03 DIAGNOSIS — R1319 Other dysphagia: Secondary | ICD-10-CM | POA: Diagnosis present

## 2018-03-03 DIAGNOSIS — F0390 Unspecified dementia without behavioral disturbance: Secondary | ICD-10-CM | POA: Insufficient documentation

## 2018-03-03 DIAGNOSIS — R1312 Dysphagia, oropharyngeal phase: Secondary | ICD-10-CM | POA: Diagnosis not present

## 2018-03-03 DIAGNOSIS — R1310 Dysphagia, unspecified: Secondary | ICD-10-CM | POA: Diagnosis not present

## 2018-03-03 DIAGNOSIS — Z66 Do not resuscitate: Secondary | ICD-10-CM | POA: Insufficient documentation

## 2018-03-03 DIAGNOSIS — R531 Weakness: Secondary | ICD-10-CM | POA: Diagnosis not present

## 2018-03-03 DIAGNOSIS — R633 Feeding difficulties: Secondary | ICD-10-CM | POA: Diagnosis not present

## 2018-03-03 DIAGNOSIS — Z8673 Personal history of transient ischemic attack (TIA), and cerebral infarction without residual deficits: Secondary | ICD-10-CM | POA: Insufficient documentation

## 2018-03-03 DIAGNOSIS — Z9181 History of falling: Secondary | ICD-10-CM | POA: Diagnosis not present

## 2018-03-03 DIAGNOSIS — R1313 Dysphagia, pharyngeal phase: Secondary | ICD-10-CM | POA: Insufficient documentation

## 2018-03-03 DIAGNOSIS — R293 Abnormal posture: Secondary | ICD-10-CM | POA: Diagnosis not present

## 2018-03-03 DIAGNOSIS — T17308A Unspecified foreign body in larynx causing other injury, initial encounter: Secondary | ICD-10-CM | POA: Diagnosis not present

## 2018-03-04 DIAGNOSIS — M810 Age-related osteoporosis without current pathological fracture: Secondary | ICD-10-CM | POA: Diagnosis not present

## 2018-03-04 DIAGNOSIS — Z9181 History of falling: Secondary | ICD-10-CM | POA: Diagnosis not present

## 2018-03-04 DIAGNOSIS — R531 Weakness: Secondary | ICD-10-CM | POA: Diagnosis not present

## 2018-03-04 DIAGNOSIS — R633 Feeding difficulties: Secondary | ICD-10-CM | POA: Diagnosis not present

## 2018-03-04 DIAGNOSIS — R293 Abnormal posture: Secondary | ICD-10-CM | POA: Diagnosis not present

## 2018-03-04 DIAGNOSIS — F0390 Unspecified dementia without behavioral disturbance: Secondary | ICD-10-CM | POA: Diagnosis not present

## 2018-03-04 NOTE — Therapy (Signed)
[facility] [address] [place], [place], [address] Phone: [phone]   Fax:  [phone]  Modified Barium Swallow  Patient Details  Name: Evelyn HutchingMildred Desena V. MRN: [id number] Date of Birth: Oct 26, 1940 No data recorded  Encounter Date: 03/03/2018  End of Session - 03/04/18 0055    Visit Number  1    Number of Visits  1    Authorization Type  Medicare    SLP Start Time  1330    SLP Stop Time   1515    SLP Time Calculation (min)  105 min    Activity Tolerance  Patient tolerated treatment well       Past Medical History:  Diagnosis Date  . Acute encephalopathy 2012   In context of diarrhea and clinical dehydration  . Dementia    Noncompliant with Aricept as per daughter  . Protein-calorie malnutrition, severe (HCC)   . Stroke (HCC)   . Underweight   . Vertigo     Past Surgical History:  Procedure Laterality Date  . COMPRESSION HIP SCREW Right 09/21/2015   Procedure: COMPRESSION HIP;  Surgeon: Vickki HearingStanley E Harrison, MD;  Location: [facility];  Service: Orthopedics;  Laterality: Right;    There were no vitals filed for this visit.  Subjective Assessment - 03/03/18 2343    Subjective  "Ahh"    Special Tests  MBSS    Currently in Pain?  No/denies          General - 03/03/18 2353      General Information   Date of Onset  02/02/18    HPI  Evelyn Flowers is a 77 yo female resident at the [facility] who was referred for MBSS due to previous history of dysphagia and diet modification and recent hospitalization with upgrade to thin liquids. She has h/o Right Femoral Fracture due to fall, Depression and End stage  dementia., Osteoporosisand Hypernatremia with Failure to thrive.    Type of Study  MBS-Modified Barium Swallow Study    Previous Swallow Assessment  Aug 2019 BSE D2/thin    Diet Prior to this Study  Dysphagia 2 (chopped);Honey-thick liquids    Temperature Spikes Noted  No    Respiratory Status  Room air    History of Recent Intubation  No    Behavior/Cognition  Alert;Cooperative;Pleasant mood;Requires cueing    Oral Cavity Assessment  Within Functional Limits    Oral Care Completed by SLP  No    Oral Cavity - Dentition  Adequate natural dentition;Missing dentition    Vision  Impaired for self feeding    Self-Feeding Abilities  Able to feed self;Needs set up    Patient Positioning  Upright in chair    Baseline Vocal Quality  Other (comment)   harsh   Volitional Cough  Weak    Volitional Swallow  Unable to elicit    Anatomy  Within functional limits    Pharyngeal Secretions  Not observed secondary MBS         Oral Preparation/Oral Phase - 03/03/18 2356      Oral Preparation/Oral Phase   Oral Phase  Impaired      Oral - Honey   Oral - Honey Cup  Weak ligual manipulation;Lingual pumping;Absent A-P transit;Other (Comment)   expectorated     Oral - Nectar   Oral - Nectar Teaspoon  Weak ligual manipulation;Decreased bolus cohesion;Delayed A-P transit;Oral residue;Piecemeal swallowing    Oral - Nectar Cup  Weak ligual manipulation;Decreased bolus cohesion;Delayed A-P transit;Oral  residue;Piecemeal swallowing      Oral - Thin   Oral - Thin Cup  Decreased bolus cohesion;Delayed A-P transit;Weak ligual manipulation;Oral residue;Piecemeal swallowing      Oral - Solids   Oral - Puree  Weak ligual manipulation;Decreased bolus cohesion;Delayed A-P transit;Oral residue;Piecemeal swallowing    Oral - Mechanical Soft  Piecemeal swallowing;Decreased bolus cohesion;Delayed A-P transit;Oral residue      Electrical stimulation - Oral Phase   Was Electrical Stimulation Used  No       Pharyngeal Phase - 03/04/18 0026      Pharyngeal Phase   Pharyngeal Phase  Impaired      Pharyngeal - Nectar   Pharyngeal- Nectar Teaspoon  Delayed swallow initiation;Swallow initiation at pyriform sinus;Reduced epiglottic inversion;Penetration/Aspiration during swallow    Pharyngeal  Material enters airway,  remains ABOVE vocal cords then ejected out    Pharyngeal- Nectar Cup  Delayed swallow initiation;Swallow initiation at pyriform sinus;Reduced epiglottic inversion;Penetration/Aspiration during swallow;Pharyngeal residue - valleculae    Pharyngeal  Material enters airway, remains ABOVE vocal cords then ejected out;Material does not enter airway      Pharyngeal - Thin   Pharyngeal- Thin Cup  Delayed swallow initiation;Swallow initiation at pyriform sinus;Reduced epiglottic inversion;Reduced airway/laryngeal closure;Penetration/Aspiration before swallow;Trace aspiration    Pharyngeal  Material enters airway, passes BELOW cords without attempt by patient to eject out (silent aspiration)      Pharyngeal - Solids   Pharyngeal- Puree  Delayed swallow initiation;Swallow initiation at vallecula;Reduced epiglottic inversion;Pharyngeal residue - valleculae    Pharyngeal- Mechanical Soft  Delayed swallow initiation;Swallow initiation at vallecula;Reduced epiglottic inversion;Pharyngeal residue - valleculae      Electrical Stimulation - Pharyngeal Phase   Was Electrical Stimulation Used  No       Cricopharyngeal Phase - 03/04/18 0053      Cervical Esophageal Phase   Cervical Esophageal Phase  Within functional limits       ADULT SLP TREATMENT - 03/03/18 0001      General Information   Behavior/Cognition  Alert;Cooperative;Pleasant mood;Requires cueing    Patient Positioning  Upright in chair    Oral care provided  N/A    HPI  Evelyn Flowers is a 77 yo female resident at the [facility] who was referred for MBSS due to previous history of dysphagia and diet modification and recent hospitalization with upgrade to thin liquids. She has h/o Right Femoral Fracture due to fall, Depression and End stage  dementia., Osteoporosisand Hypernatremia with Failure to thrive.        Plan - 03/04/18 0057    Clinical Impression Statement Pt presents with functionally severe oral phase and moderate  pharyngeal phase dysphagia characterized by reduced lingual manipulation and anterior posterior transit or bolus with significant oral holding; premature spillage of liquids exhibited to the level of the pyriforms which then resulted in trace/mild aspiration of thins during the swallow and one episode of deep penetration of NTL during the swallow. Pt with reduced epiglottic deflection across boluses with resulting min vallecular residue post swallow. Pt's primary barrier to safe and efficient oral intake appears to be severe delays and difficulty triggering oral phase as she needed max cues throughout the study to "move your tongue, lips, chew" in order to facilitate anterior posterior transit of bolus. SLP also tried presenting dry spoon and asking questions to help trigger, however only minimally effective and often resulted in labial spillage. Treating SLP stated this performance appears similar to when consuming meals (takes a very long time to  consume meals). Pt is at significant risk for dehydration, inability to meeting nutritional needs, and aspiration. Recommend D2/chopped as mastication did appear to help facilitate swallow trigger and NTL via cup sips. Recommendations reviewed with treating SLP. No family present for this evaluation.       Patient will benefit from skilled therapeutic intervention in order to improve the following deficits and impairments:   Dysphagia, oropharyngeal phase    Recommendations/Treatment - 03/04/18 0053      Swallow Evaluation Recommendations   SLP Diet Recommendations  Dysphagia 2 (chopped);Nectar    Thickener user  Powder    Liquid Administration via  Cup    Medication Administration  Crushed with puree    Supervision  Patient able to self feed;Staff to assist with self feeding;Full supervision/cueing for compensatory strategies    Compensations  Minimize environmental distractions;Slow rate;Small sips/bites;Follow solids with liquid    Postural Changes   Seated upright at 90 degrees;Remain upright for at least 30 minutes after feeds/meals       Prognosis - 03/04/18 0055      Prognosis   Prognosis for Safe Diet Advancement  Guarded    Barriers to Reach Goals  Cognitive deficits;Severity of deficits      Individuals Consulted   Consulted and Agree with Results and Recommendations  Patient;Patient unable/family or caregiver not available    Report Sent to   Primary SLP;Facility (Comment)       Problem List Patient Active Problem List   Diagnosis Date Noted  . Malnutrition of moderate degree Feb 14, 2018  . CVA (cerebral vascular accident) (HCC) 02/02/2018  . Failure to thrive in adult 10/14/2017  . Hypernatremia 07/28/2017  . Recurrent falls 07/28/2017  . Apnea spell 06/16/2017  . DNR (do not resuscitate) 06/16/2017  . Osteoporosis 02/18/2017  . Depression, recurrent (HCC) 10/04/2016  . Vitamin D deficiency 08/24/2016  . Dementia 09/26/2015  . Closed intertrochanteric fracture of right femur (HCC) 09/19/2015  . UTI (lower urinary tract infection) 09/19/2015  . Acute encephalopathy 06/09/2015     Thank you,  [name], CCC-SLP [phone]  [facility] 03/04/2018, 12:58 AM   [facility] [address] [place], [place], [address] Phone: [phone]   Fax:  [phone]  Name: Bekka Qian MRN: [id number] Date of Birth: 07/11/1940

## 2018-03-07 DIAGNOSIS — R531 Weakness: Secondary | ICD-10-CM | POA: Diagnosis not present

## 2018-03-07 DIAGNOSIS — R293 Abnormal posture: Secondary | ICD-10-CM | POA: Diagnosis not present

## 2018-03-07 DIAGNOSIS — F0390 Unspecified dementia without behavioral disturbance: Secondary | ICD-10-CM | POA: Diagnosis not present

## 2018-03-07 DIAGNOSIS — M810 Age-related osteoporosis without current pathological fracture: Secondary | ICD-10-CM | POA: Diagnosis not present

## 2018-03-07 DIAGNOSIS — R633 Feeding difficulties: Secondary | ICD-10-CM | POA: Diagnosis not present

## 2018-03-07 DIAGNOSIS — Z9181 History of falling: Secondary | ICD-10-CM | POA: Diagnosis not present

## 2018-03-08 DIAGNOSIS — R531 Weakness: Secondary | ICD-10-CM | POA: Diagnosis not present

## 2018-03-08 DIAGNOSIS — R293 Abnormal posture: Secondary | ICD-10-CM | POA: Diagnosis not present

## 2018-03-08 DIAGNOSIS — M810 Age-related osteoporosis without current pathological fracture: Secondary | ICD-10-CM | POA: Diagnosis not present

## 2018-03-08 DIAGNOSIS — F0390 Unspecified dementia without behavioral disturbance: Secondary | ICD-10-CM | POA: Diagnosis not present

## 2018-03-08 DIAGNOSIS — R633 Feeding difficulties: Secondary | ICD-10-CM | POA: Diagnosis not present

## 2018-03-08 DIAGNOSIS — Z9181 History of falling: Secondary | ICD-10-CM | POA: Diagnosis not present

## 2018-03-09 DIAGNOSIS — M810 Age-related osteoporosis without current pathological fracture: Secondary | ICD-10-CM | POA: Diagnosis not present

## 2018-03-09 DIAGNOSIS — R293 Abnormal posture: Secondary | ICD-10-CM | POA: Diagnosis not present

## 2018-03-09 DIAGNOSIS — F0390 Unspecified dementia without behavioral disturbance: Secondary | ICD-10-CM | POA: Diagnosis not present

## 2018-03-09 DIAGNOSIS — Z9181 History of falling: Secondary | ICD-10-CM | POA: Diagnosis not present

## 2018-03-09 DIAGNOSIS — R633 Feeding difficulties: Secondary | ICD-10-CM | POA: Diagnosis not present

## 2018-03-09 DIAGNOSIS — R531 Weakness: Secondary | ICD-10-CM | POA: Diagnosis not present

## 2018-03-10 DIAGNOSIS — R293 Abnormal posture: Secondary | ICD-10-CM | POA: Diagnosis not present

## 2018-03-10 DIAGNOSIS — F0390 Unspecified dementia without behavioral disturbance: Secondary | ICD-10-CM | POA: Diagnosis not present

## 2018-03-10 DIAGNOSIS — Z9181 History of falling: Secondary | ICD-10-CM | POA: Diagnosis not present

## 2018-03-10 DIAGNOSIS — R633 Feeding difficulties: Secondary | ICD-10-CM | POA: Diagnosis not present

## 2018-03-10 DIAGNOSIS — R531 Weakness: Secondary | ICD-10-CM | POA: Diagnosis not present

## 2018-03-10 DIAGNOSIS — M810 Age-related osteoporosis without current pathological fracture: Secondary | ICD-10-CM | POA: Diagnosis not present

## 2018-03-11 DIAGNOSIS — R633 Feeding difficulties: Secondary | ICD-10-CM | POA: Diagnosis not present

## 2018-03-11 DIAGNOSIS — F0390 Unspecified dementia without behavioral disturbance: Secondary | ICD-10-CM | POA: Diagnosis not present

## 2018-03-11 DIAGNOSIS — R531 Weakness: Secondary | ICD-10-CM | POA: Diagnosis not present

## 2018-03-11 DIAGNOSIS — M810 Age-related osteoporosis without current pathological fracture: Secondary | ICD-10-CM | POA: Diagnosis not present

## 2018-03-11 DIAGNOSIS — R293 Abnormal posture: Secondary | ICD-10-CM | POA: Diagnosis not present

## 2018-03-11 DIAGNOSIS — Z9181 History of falling: Secondary | ICD-10-CM | POA: Diagnosis not present

## 2018-03-14 DIAGNOSIS — I739 Peripheral vascular disease, unspecified: Secondary | ICD-10-CM | POA: Diagnosis not present

## 2018-03-14 DIAGNOSIS — R293 Abnormal posture: Secondary | ICD-10-CM | POA: Diagnosis not present

## 2018-03-14 DIAGNOSIS — R531 Weakness: Secondary | ICD-10-CM | POA: Diagnosis not present

## 2018-03-14 DIAGNOSIS — F0390 Unspecified dementia without behavioral disturbance: Secondary | ICD-10-CM | POA: Diagnosis not present

## 2018-03-14 DIAGNOSIS — L603 Nail dystrophy: Secondary | ICD-10-CM | POA: Diagnosis not present

## 2018-03-14 DIAGNOSIS — Z9181 History of falling: Secondary | ICD-10-CM | POA: Diagnosis not present

## 2018-03-14 DIAGNOSIS — M810 Age-related osteoporosis without current pathological fracture: Secondary | ICD-10-CM | POA: Diagnosis not present

## 2018-03-14 DIAGNOSIS — R633 Feeding difficulties: Secondary | ICD-10-CM | POA: Diagnosis not present

## 2018-03-14 DIAGNOSIS — B351 Tinea unguium: Secondary | ICD-10-CM | POA: Diagnosis not present

## 2018-03-15 DIAGNOSIS — M810 Age-related osteoporosis without current pathological fracture: Secondary | ICD-10-CM | POA: Diagnosis not present

## 2018-03-15 DIAGNOSIS — F0390 Unspecified dementia without behavioral disturbance: Secondary | ICD-10-CM | POA: Diagnosis not present

## 2018-03-15 DIAGNOSIS — Z9181 History of falling: Secondary | ICD-10-CM | POA: Diagnosis not present

## 2018-03-15 DIAGNOSIS — R293 Abnormal posture: Secondary | ICD-10-CM | POA: Diagnosis not present

## 2018-03-15 DIAGNOSIS — R531 Weakness: Secondary | ICD-10-CM | POA: Diagnosis not present

## 2018-03-15 DIAGNOSIS — R633 Feeding difficulties: Secondary | ICD-10-CM | POA: Diagnosis not present

## 2018-03-16 DIAGNOSIS — M810 Age-related osteoporosis without current pathological fracture: Secondary | ICD-10-CM | POA: Diagnosis not present

## 2018-03-16 DIAGNOSIS — R633 Feeding difficulties: Secondary | ICD-10-CM | POA: Diagnosis not present

## 2018-03-16 DIAGNOSIS — Z9181 History of falling: Secondary | ICD-10-CM | POA: Diagnosis not present

## 2018-03-16 DIAGNOSIS — R293 Abnormal posture: Secondary | ICD-10-CM | POA: Diagnosis not present

## 2018-03-16 DIAGNOSIS — R531 Weakness: Secondary | ICD-10-CM | POA: Diagnosis not present

## 2018-03-16 DIAGNOSIS — F0390 Unspecified dementia without behavioral disturbance: Secondary | ICD-10-CM | POA: Diagnosis not present

## 2018-03-17 DIAGNOSIS — F0390 Unspecified dementia without behavioral disturbance: Secondary | ICD-10-CM | POA: Diagnosis not present

## 2018-03-17 DIAGNOSIS — M810 Age-related osteoporosis without current pathological fracture: Secondary | ICD-10-CM | POA: Diagnosis not present

## 2018-03-17 DIAGNOSIS — R531 Weakness: Secondary | ICD-10-CM | POA: Diagnosis not present

## 2018-03-17 DIAGNOSIS — R293 Abnormal posture: Secondary | ICD-10-CM | POA: Diagnosis not present

## 2018-03-17 DIAGNOSIS — R633 Feeding difficulties: Secondary | ICD-10-CM | POA: Diagnosis not present

## 2018-03-17 DIAGNOSIS — Z9181 History of falling: Secondary | ICD-10-CM | POA: Diagnosis not present

## 2018-03-18 DIAGNOSIS — Z9181 History of falling: Secondary | ICD-10-CM | POA: Diagnosis not present

## 2018-03-18 DIAGNOSIS — R633 Feeding difficulties: Secondary | ICD-10-CM | POA: Diagnosis not present

## 2018-03-18 DIAGNOSIS — R293 Abnormal posture: Secondary | ICD-10-CM | POA: Diagnosis not present

## 2018-03-18 DIAGNOSIS — F0390 Unspecified dementia without behavioral disturbance: Secondary | ICD-10-CM | POA: Diagnosis not present

## 2018-03-18 DIAGNOSIS — R531 Weakness: Secondary | ICD-10-CM | POA: Diagnosis not present

## 2018-03-18 DIAGNOSIS — M810 Age-related osteoporosis without current pathological fracture: Secondary | ICD-10-CM | POA: Diagnosis not present

## 2018-03-19 DIAGNOSIS — F321 Major depressive disorder, single episode, moderate: Secondary | ICD-10-CM | POA: Diagnosis not present

## 2018-03-19 DIAGNOSIS — F0390 Unspecified dementia without behavioral disturbance: Secondary | ICD-10-CM | POA: Diagnosis not present

## 2018-03-19 DIAGNOSIS — F4323 Adjustment disorder with mixed anxiety and depressed mood: Secondary | ICD-10-CM | POA: Diagnosis not present

## 2018-03-20 DIAGNOSIS — F0390 Unspecified dementia without behavioral disturbance: Secondary | ICD-10-CM | POA: Diagnosis not present

## 2018-03-20 DIAGNOSIS — R633 Feeding difficulties: Secondary | ICD-10-CM | POA: Diagnosis not present

## 2018-03-20 DIAGNOSIS — Z9181 History of falling: Secondary | ICD-10-CM | POA: Diagnosis not present

## 2018-03-20 DIAGNOSIS — R531 Weakness: Secondary | ICD-10-CM | POA: Diagnosis not present

## 2018-03-20 DIAGNOSIS — R293 Abnormal posture: Secondary | ICD-10-CM | POA: Diagnosis not present

## 2018-03-20 DIAGNOSIS — M810 Age-related osteoporosis without current pathological fracture: Secondary | ICD-10-CM | POA: Diagnosis not present

## 2018-03-21 ENCOUNTER — Non-Acute Institutional Stay (SKILLED_NURSING_FACILITY): Payer: Medicare Other

## 2018-03-21 DIAGNOSIS — R531 Weakness: Secondary | ICD-10-CM | POA: Diagnosis not present

## 2018-03-21 DIAGNOSIS — R293 Abnormal posture: Secondary | ICD-10-CM | POA: Diagnosis not present

## 2018-03-21 DIAGNOSIS — Z0000 Encounter for general adult medical examination without abnormal findings: Secondary | ICD-10-CM | POA: Diagnosis not present

## 2018-03-21 DIAGNOSIS — F0390 Unspecified dementia without behavioral disturbance: Secondary | ICD-10-CM | POA: Diagnosis not present

## 2018-03-21 DIAGNOSIS — Z9181 History of falling: Secondary | ICD-10-CM | POA: Diagnosis not present

## 2018-03-21 DIAGNOSIS — M810 Age-related osteoporosis without current pathological fracture: Secondary | ICD-10-CM | POA: Diagnosis not present

## 2018-03-21 DIAGNOSIS — R633 Feeding difficulties: Secondary | ICD-10-CM | POA: Diagnosis not present

## 2018-03-21 NOTE — Patient Instructions (Signed)
[name] , Thank you for taking time to come for your Medicare Wellness Visit. I appreciate your ongoing commitment to your health goals. Please review the following plan we discussed and let me know if I can assist you in the future.   Screening recommendations/referrals: Colonoscopy excluded, over age 79 Mammogram excluded, over age 54 Bone Density up to date Recommended yearly ophthalmology/optometry visit for glaucoma screening and checkup Recommended yearly dental visit for hygiene and checkup  Vaccinations: Influenza vaccine due, will receive at [facility] Pneumococcal vaccine up to date, completed Tdap vaccine up to date, due 03/05/2027 Shingles vaccine not in past records    Advanced directives: in chart  Conditions/risks identified: none  Next appointment: Dr. Chales Abrahams makes rounds   Preventive Care 65 Years and Older, Female Preventive care refers to lifestyle choices and visits with your health care provider that can promote health and wellness. What does preventive care include?  A yearly physical exam. This is also called an annual well check.  Dental exams once or twice a year.  Routine eye exams. Ask your health care provider how often you should have your eyes checked.  Personal lifestyle choices, including:  Daily care of your teeth and gums.  Regular physical activity.  Eating a healthy diet.  Avoiding tobacco and drug use.  Limiting alcohol use.  Practicing safe sex.  Taking low-dose aspirin every day.  Taking vitamin and mineral supplements as recommended by your health care provider. What happens during an annual well check? The services and screenings done by your health care provider during your annual well check will depend on your age, overall health, lifestyle risk factors, and family history of disease. Counseling  Your health care provider may ask you questions about your:  Alcohol use.  Tobacco use.  Drug use.  Emotional  well-being.  Home and relationship well-being.  Sexual activity.  Eating habits.  History of falls.  Memory and ability to understand (cognition).  Work and work Astronomer.  Reproductive health. Screening  You may have the following tests or measurements:  Height, weight, and BMI.  Blood pressure.  Lipid and cholesterol levels. These may be checked every 5 years, or more frequently if you are over 54 years old.  Skin check.  Lung cancer screening. You may have this screening every year starting at age 45 if you have a 30-pack-year history of smoking and currently smoke or have quit within the past 15 years.  Fecal occult blood test (FOBT) of the stool. You may have this test every year starting at age 24.  Flexible sigmoidoscopy or colonoscopy. You may have a sigmoidoscopy every 5 years or a colonoscopy every 10 years starting at age 33.  Hepatitis C blood test.  Hepatitis B blood test.  Sexually transmitted disease (STD) testing.  Diabetes screening. This is done by checking your blood sugar (glucose) after you have not eaten for a while (fasting). You may have this done every 1-3 years.  Bone density scan. This is done to screen for osteoporosis. You may have this done starting at age 76.  Mammogram. This may be done every 1-2 years. Talk to your health care provider about how often you should have regular mammograms. Talk with your health care provider about your test results, treatment options, and if necessary, the need for more tests. Vaccines  Your health care provider may recommend certain vaccines, such as:  Influenza vaccine. This is recommended every year.  Tetanus, diphtheria, and acellular pertussis (Tdap, Td) vaccine.  You may need a Td booster every 10 years.  Zoster vaccine. You may need this after age 22.  Pneumococcal 13-valent conjugate (PCV13) vaccine. One dose is recommended after age 76.  Pneumococcal polysaccharide (PPSV23) vaccine. One  dose is recommended after age 69. Talk to your health care provider about which screenings and vaccines you need and how often you need them. This information is not intended to replace advice given to you by your health care provider. Make sure you discuss any questions you have with your health care provider. Document Released: 07/05/2015 Document Revised: 02/26/2016 Document Reviewed: 04/09/2015 Elsevier Interactive Patient Education  2017 [place] Prevention in the Home Falls can cause injuries. They can happen to people of all ages. There are many things you can do to make your home safe and to help prevent falls. What can I do on the outside of my home?  Regularly fix the edges of walkways and driveways and fix any cracks.  Remove anything that might make you trip as you walk through a door, such as a raised step or threshold.  Trim any bushes or trees on the path to your home.  Use bright outdoor lighting.  Clear any walking paths of anything that might make someone trip, such as rocks or tools.  Regularly check to see if handrails are loose or broken. Make sure that both sides of any steps have handrails.  Any raised decks and porches should have guardrails on the edges.  Have any leaves, snow, or ice cleared regularly.  Use sand or salt on walking paths during winter.  Clean up any spills in your garage right away. This includes oil or grease spills. What can I do in the bathroom?  Use night lights.  Install grab bars by the toilet and in the tub and shower. Do not use towel bars as grab bars.  Use non-skid mats or decals in the tub or shower.  If you need to sit down in the shower, use a plastic, non-slip stool.  Keep the floor dry. Clean up any water that spills on the floor as soon as it happens.  Remove soap buildup in the tub or shower regularly.  Attach bath mats securely with double-sided non-slip rug tape.  Do not have throw rugs and other  things on the floor that can make you trip. What can I do in the bedroom?  Use night lights.  Make sure that you have a light by your bed that is easy to reach.  Do not use any sheets or blankets that are too big for your bed. They should not hang down onto the floor.  Have a firm chair that has side arms. You can use this for support while you get dressed.  Do not have throw rugs and other things on the floor that can make you trip. What can I do in the kitchen?  Clean up any spills right away.  Avoid walking on wet floors.  Keep items that you use a lot in easy-to-reach places.  If you need to reach something above you, use a strong step stool that has a grab bar.  Keep electrical cords out of the way.  Do not use floor polish or wax that makes floors slippery. If you must use wax, use non-skid floor wax.  Do not have throw rugs and other things on the floor that can make you trip. What can I do with my stairs?  Do not leave any  items on the stairs.  Make sure that there are handrails on both sides of the stairs and use them. Fix handrails that are broken or loose. Make sure that handrails are as long as the stairways.  Check any carpeting to make sure that it is firmly attached to the stairs. Fix any carpet that is loose or worn.  Avoid having throw rugs at the top or bottom of the stairs. If you do have throw rugs, attach them to the floor with carpet tape.  Make sure that you have a light switch at the top of the stairs and the bottom of the stairs. If you do not have them, ask someone to add them for you. What else can I do to help prevent falls?  Wear shoes that:  Do not have high heels.  Have rubber bottoms.  Are comfortable and fit you well.  Are closed at the toe. Do not wear sandals.  If you use a stepladder:  Make sure that it is fully opened. Do not climb a closed stepladder.  Make sure that both sides of the stepladder are locked into place.  Ask  someone to hold it for you, if possible.  Clearly mark and make sure that you can see:  Any grab bars or handrails.  First and last steps.  Where the edge of each step is.  Use tools that help you move around (mobility aids) if they are needed. These include:  Canes.  Walkers.  Scooters.  Crutches.  Turn on the lights when you go into a dark area. Replace any light bulbs as soon as they burn out.  Set up your furniture so you have a clear path. Avoid moving your furniture around.  If any of your floors are uneven, fix them.  If there are any pets around you, be aware of where they are.  Review your medicines with your doctor. Some medicines can make you feel dizzy. This can increase your chance of falling. Ask your doctor what other things that you can do to help prevent falls. This information is not intended to replace advice given to you by your health care provider. Make sure you discuss any questions you have with your health care provider. Document Released: 04/04/2009 Document Revised: 11/14/2015 Document Reviewed: 07/13/2014 Elsevier Interactive Patient Education  2017 [employer].

## 2018-03-21 NOTE — Progress Notes (Addendum)
Subjective:   Evelyn Flowers is a 77 y.o. female who presents for Medicare Annual (Subsequent) preventive examination; incapacitated patient unable to answer questions appropriately   Last AWV-03/01/2017    Objective:     Vitals: BP 128/78 (BP Location: Left Arm, Patient Position: Sitting)   Pulse 75   Temp 98.6 F (37 C) (Oral)   Ht 5\' 2"  (1.575 m)   Wt 105 lb (47.6 kg)   BMI 19.20 kg/m   Body mass index is 19.2 kg/m.  Advanced Directives 03/21/2018 03/03/2018 February 07, 2018 02/03/2018 02/02/2018 02/01/2018 01/11/2018  Does Patient Have a Medical Advance Directive? Yes Yes Yes Yes Yes Yes Yes  Type of Advance Directive Out of facility DNR (pink MOST or yellow form) Out of facility DNR (pink MOST or yellow form) Out of facility DNR (pink MOST or yellow form) Out of facility DNR (pink MOST or yellow form) Out of facility DNR (pink MOST or yellow form) Out of facility DNR (pink MOST or yellow form) Out of facility DNR (pink MOST or yellow form)  Does patient want to make changes to medical advance directive? No - Patient declined - No - Patient declined No - Patient declined - No - Patient declined No - Patient declined  Copy of Healthcare Power of Attorney in Chart? Yes Yes No - copy requested Yes - No - copy requested No - copy requested  Would patient like information on creating a medical advance directive? No - Patient declined No - Patient declined No - Patient declined No - Patient declined No - Patient declined No - Patient declined No - Patient declined  Pre-existing out of facility DNR order (yellow form or pink MOST form) Yellow form placed in chart (order not valid for inpatient use);Pink MOST form placed in chart (order not valid for inpatient use);Physician notified to receive inpatient order Yellow form placed in chart (order not valid for inpatient use);Pink MOST form placed in chart (order not valid for inpatient use);Physician notified to receive inpatient order - Yellow form  placed in chart (order not valid for inpatient use);Pink MOST form placed in chart (order not valid for inpatient use);Physician notified to receive inpatient order - - -    Tobacco Social History   Tobacco Use  Smoking Status Never Smoker  Smokeless Tobacco Never Used     Counseling given: Not Answered   Clinical Intake:  Pre-visit preparation completed: No  Pain : Faces Faces Pain Scale: Hurts even more Pain Type: Chronic pain Pain Location: Hip Pain Orientation: Right Pain Frequency: Intermittent  Faces Pain Scale: Hurts even more  Diabetes: No  How often do you need to have someone help you when you read instructions, pamphlets, or other written materials from your doctor or pharmacy?: 4 - Often  Interpreter Needed?: No  Information entered by :: Tyron Russell, RN  Past Medical History:  Diagnosis Date  . Acute encephalopathy 2012   In context of diarrhea and clinical dehydration  . Dementia    Noncompliant with Aricept as per daughter  . Protein-calorie malnutrition, severe (HCC)   . Stroke (HCC)   . Underweight   . Vertigo    Past Surgical History:  Procedure Laterality Date  . COMPRESSION HIP SCREW Right 09/21/2015   Procedure: COMPRESSION HIP;  Surgeon: Vickki Hearing, MD;  Location: [facility];  Service: Orthopedics;  Laterality: Right;   Family History  Problem Relation Age of Onset  . Dementia Mother   . Cancer Paternal Grandmother  of upper extremity  . Heart disease Neg Hx   . Diabetes Neg Hx   . Stroke Neg Hx    Social History   Socioeconomic History  . Marital status: Widowed    Spouse name: Not on file  . Number of children: Not on file  . Years of education: Not on file  . Highest education level: Not on file  Occupational History  . Not on file  Social Needs  . Financial resource strain: Not on file  . Food insecurity:    Worry: Not on file    Inability: Not on file  . Transportation needs:    Medical: Not on file     Non-medical: Not on file  Tobacco Use  . Smoking status: Never Smoker  . Smokeless tobacco: Never Used  Substance and Sexual Activity  . Alcohol use: Yes    Comment: occasionally  . Drug use: No  . Sexual activity: Never  Lifestyle  . Physical activity:    Days per week: Not on file    Minutes per session: Not on file  . Stress: Not on file  Relationships  . Social connections:    Talks on phone: Not on file    Gets together: Not on file    Attends religious service: Not on file    Active member of club or organization: Not on file    Attends meetings of clubs or organizations: Not on file    Relationship status: Not on file  Other Topics Concern  . Not on file  Social History Narrative  . Not on file    Outpatient Encounter Medications as of 03/21/2018  Medication Sig  . acetaminophen (TYLENOL) 325 MG tablet Take 2 tablets (650 mg total) by mouth every 6 (six) hours as needed for mild pain (or Fever >/= 101).  Marland Kitchen aspirin EC 325 MG EC tablet Take 1 tablet (325 mg total) by mouth daily with breakfast.  . calcium carbonate (OS-CAL - DOSED IN MG OF ELEMENTAL CALCIUM) 1250 (500 Ca) MG tablet Take 1 tablet by mouth 2 (two) times daily with a meal.  . Cholecalciferol 1000 units tablet Take 2,000 Units by mouth every morning.   . donepezil (ARICEPT) 5 MG tablet Take 5 mg by mouth at bedtime.  . memantine (NAMENDA) 5 MG tablet Take 5 mg by mouth 2 (two) times daily.  . mirtazapine (REMERON) 15 MG tablet Take 7.5 mg by mouth at bedtime.   . senna-docusate (SENOKOT-S) 8.6-50 MG tablet Take 1 tablet by mouth 2 (two) times daily.  . sertraline (ZOLOFT) 25 MG tablet Take 25 mg by mouth every morning.   . triamcinolone (KENALOG) 0.025 % cream Apply 1 application topically 2 (two) times daily as needed (for itching/rash).    No facility-administered encounter medications on file as of 03/21/2018.     Activities of Daily Living In your present state of health, do you have any difficulty  performing the following activities: 03/21/2018 02/03/2018  Hearing? N N  Vision? Y Y  Difficulty concentrating or making decisions? Malvin Johns  Walking or climbing stairs? Y Y  Dressing or bathing? Y Y  Doing errands, shopping? Malvin Johns  Preparing Food and eating ? Y -  Using the Toilet? Y -  In the past six months, have you accidently leaked urine? Y -  Do you have problems with loss of bowel control? Y -  Managing your Medications? Y -  Managing your Finances? Y -  Housekeeping or managing  your Housekeeping? Y -  Some recent data might be hidden    Patient Care Team: Mahlon Gammon, MD as PCP - General (Internal Medicine) Synetta Shadow as Physician Assistant (Internal Medicine)    Assessment:   This is a routine wellness examination for [place].  Exercise Activities and Dietary recommendations Current Exercise Habits: The patient does not participate in regular exercise at present, Exercise limited by: orthopedic condition(s);neurologic condition(s)  Goals   None     Fall Risk Fall Risk  03/21/2018 03/01/2017  Falls in the past year? No No   Is the patient's home free of loose throw rugs in walkways, pet beds, electrical cords, etc?   yes      Grab bars in the bathroom? yes      Handrails on the stairs?   yes      Adequate lighting?   yes  Depression Screen PHQ 2/9 Scores 03/21/2018 03/01/2017  PHQ - 2 Score - 0  Exception Documentation Medical reason -     Cognitive Function MMSE - Mini Mental State Exam 03/21/2018  Not completed: Unable to complete     6CIT Screen 03/01/2017  What Year? 4 points  What month? 0 points  What time? 3 points  Count back from 20 0 points  Months in reverse 4 points  Repeat phrase 10 points  Total Score 21    Immunization History  Administered Date(s) Administered  . Influenza-Unspecified 03/20/2016, 05/03/2017  . PPD Test 06/12/2015  . Pneumococcal Conjugate-13 05/03/2017  . Pneumococcal-Unspecified 04/02/2016  . Tdap  03/04/2017    Qualifies for Shingles Vaccine? Not in past records  Screening Tests Health Maintenance  Topic Date Due  . INFLUENZA VACCINE  01/20/2018  . TETANUS/TDAP  03/05/2027  . DEXA SCAN  Completed  . PNA vac Low Risk Adult  Completed    Cancer Screenings: Lung: Low Dose CT Chest recommended if Age 91-80 years, 30 pack-year currently smoking OR have quit w/in 15years. Patient does not qualify. Breast:  Up to date on Mammogram? Yes   Up to date of Bone Density/Dexa? Yes Colorectal: up to date  Additional Screenings:  Hepatitis C Screening: unable to appropriately accept or decline Flu vaccine due: will receive at [facility]    Plan:    I have personally reviewed and addressed the Medicare Annual Wellness questionnaire and have noted the following in the patient's chart:  A. Medical and social history B. Use of alcohol, tobacco or illicit drugs  C. Current medications and supplements D. Functional ability and status E.  Nutritional status F.  Physical activity G. Advance directives H. List of other physicians I.  Hospitalizations, surgeries, and ER visits in previous 12 months J.  Vitals K. Screenings to include hearing, vision, cognitive, depression L. Referrals and appointments - none  In addition, I am unable to review and discuss with incapacitated patient certain preventive protocols, quality metrics, and best practice recommendations. A written personalized care plan for preventive services as well as general preventive health recommendations were provided to patient.   See attached scanned questionnaire for additional information.   Signed,   Tyron Russell, RN Nurse Health Advisor  Patient Concerns: None

## 2018-03-22 DIAGNOSIS — M810 Age-related osteoporosis without current pathological fracture: Secondary | ICD-10-CM | POA: Diagnosis not present

## 2018-03-22 DIAGNOSIS — Z9181 History of falling: Secondary | ICD-10-CM | POA: Diagnosis not present

## 2018-03-22 DIAGNOSIS — R531 Weakness: Secondary | ICD-10-CM | POA: Diagnosis not present

## 2018-03-22 DIAGNOSIS — R1312 Dysphagia, oropharyngeal phase: Secondary | ICD-10-CM | POA: Diagnosis not present

## 2018-03-22 DIAGNOSIS — F0390 Unspecified dementia without behavioral disturbance: Secondary | ICD-10-CM | POA: Diagnosis not present

## 2018-03-22 DIAGNOSIS — R293 Abnormal posture: Secondary | ICD-10-CM | POA: Diagnosis not present

## 2018-03-22 DIAGNOSIS — R633 Feeding difficulties: Secondary | ICD-10-CM | POA: Diagnosis not present

## 2018-03-23 DIAGNOSIS — Z9181 History of falling: Secondary | ICD-10-CM | POA: Diagnosis not present

## 2018-03-23 DIAGNOSIS — R531 Weakness: Secondary | ICD-10-CM | POA: Diagnosis not present

## 2018-03-23 DIAGNOSIS — M810 Age-related osteoporosis without current pathological fracture: Secondary | ICD-10-CM | POA: Diagnosis not present

## 2018-03-23 DIAGNOSIS — R293 Abnormal posture: Secondary | ICD-10-CM | POA: Diagnosis not present

## 2018-03-23 DIAGNOSIS — F0390 Unspecified dementia without behavioral disturbance: Secondary | ICD-10-CM | POA: Diagnosis not present

## 2018-03-23 DIAGNOSIS — R633 Feeding difficulties: Secondary | ICD-10-CM | POA: Diagnosis not present

## 2018-04-20 ENCOUNTER — Encounter: Payer: Self-pay | Admitting: Internal Medicine

## 2018-04-20 ENCOUNTER — Non-Acute Institutional Stay (SKILLED_NURSING_FACILITY): Payer: Medicare Other | Admitting: Internal Medicine

## 2018-04-20 DIAGNOSIS — E559 Vitamin D deficiency, unspecified: Secondary | ICD-10-CM | POA: Diagnosis not present

## 2018-04-20 DIAGNOSIS — E43 Unspecified severe protein-calorie malnutrition: Secondary | ICD-10-CM | POA: Diagnosis not present

## 2018-04-20 DIAGNOSIS — S72141S Displaced intertrochanteric fracture of right femur, sequela: Secondary | ICD-10-CM | POA: Diagnosis not present

## 2018-04-20 DIAGNOSIS — M810 Age-related osteoporosis without current pathological fracture: Secondary | ICD-10-CM

## 2018-04-20 DIAGNOSIS — F0390 Unspecified dementia without behavioral disturbance: Secondary | ICD-10-CM | POA: Diagnosis not present

## 2018-04-20 NOTE — Progress Notes (Signed)
Location:    [facility] Room Number: 154/D Place of Service:  SNF [id number]) Provider:  Edmon Crape PA-C  Mahlon Gammon, MD  Patient Care Team: Mahlon Gammon, MD as PCP - General (Internal Medicine) Synetta Shadow as Physician Assistant (Internal Medicine)  Extended Emergency Contact Information Primary Emergency Contact: [facility] Address: [address] [place], [place] [address] [place] Home Phone: [phone] Mobile Phone: [phone] Relation: Daughter Secondary Emergency Contact: Gena Fray [place] of [place] Mobile Phone: [phone] Relation: Son  Code Status:  DNR Goals of care: Advanced Directive information Advanced Directives 04/20/2018  Does Patient Have a Medical Advance Directive? Yes  Type of Advance Directive Out of facility DNR (pink MOST or yellow form)  Does patient want to make changes to medical advance directive? No - Patient declined  Copy of Healthcare Power of Attorney in Chart? No - copy requested  Would patient like information on creating a medical advance directive? No - Patient declined  Pre-existing out of facility DNR order (yellow form or pink MOST form) -     Chief Complaint  Patient presents with  . Medical Management of Chronic Issues    Routine visit of medical managenment  Of chronic medical conditions including end-stage dementia- osteoporosis-severe malnutrition-vitamin D deficiency- history of right femoral fracture HPI:  Pt is a 77 y.o. female seen today for medical management of chronic diseases.  As noted above.  Most recent acute issue was in August when apparently it was thought she was having a CVA and went to the hospital- their work-up was reassuring testing was negative for CVA-she was continued on aspirin- of note her LDL was less than 100 at 89.  Today she appears at her baseline she will speak a small amount at times she is following simple  verbal commands- nursing does not report any behaviors.  Her appetite continues to be spotty staff and family have been encouraging p.o. intake.  Her weight appears to have stabilized the last couple months currently at 102.6 pounds- she is lost about 5 pounds since August but again this appears stabilized the last month or 2.  In regards to her severe malnutrition she is on Remeron.  Regards to dementia again which appears to be nearing end-stage she is on Namenda and Aricept her family has filled out a  MOST form requesting no aggressive interventions no feeding tube.  She does have a history of vitamin D deficiency last vitamin D level was 24.2 back in August will update this she is on supplementation.  Currently she is resting in bed comfortably vital signs appear to be stable-- she is following simple verbal commands today-- continues to smile and be pleasantly cooperative although she speaks minimally she does make eye contact and she is alert     Past Medical History:  Diagnosis Date  . Acute encephalopathy 2012   In context of diarrhea and clinical dehydration  . Dementia (HCC)    Noncompliant with Aricept as per daughter  . Protein-calorie malnutrition, severe (HCC)   . Stroke (HCC)   . Underweight   . Vertigo    Past Surgical History:  Procedure Laterality Date  . COMPRESSION HIP SCREW Right 09/21/2015   Procedure: COMPRESSION HIP;  Surgeon: Vickki Hearing, MD;  Location: [facility];  Service: Orthopedics;  Laterality: Right;    Allergies  Allergen Reactions  . Beef-Derived Products  Unknown reaction-Listed on [employer]    Outpatient Encounter Medications as of 04/20/2018  Medication Sig  . acetaminophen (TYLENOL) 325 MG tablet Take 2 tablets (650 mg total) by mouth every 6 (six) hours as needed for mild pain (or Fever >/= 101).  Marland Kitchen aspirin EC 325 MG EC tablet Take 1 tablet (325 mg total) by mouth daily with breakfast.  . calcium carbonate (OS-CAL - DOSED IN MG OF  ELEMENTAL CALCIUM) 1250 (500 Ca) MG tablet Take 1 tablet by mouth 2 (two) times daily with a meal.  . Cholecalciferol 1000 units tablet Take 2,000 Units by mouth every morning.   . donepezil (ARICEPT) 5 MG tablet Take 5 mg by mouth at bedtime.  . memantine (NAMENDA) 5 MG tablet Take 5 mg by mouth 2 (two) times daily.  . mirtazapine (REMERON) 15 MG tablet Take 7.5 mg by mouth at bedtime.   . senna-docusate (SENOKOT-S) 8.6-50 MG tablet Take 1 tablet by mouth 2 (two) times daily.  . sertraline (ZOLOFT) 25 MG tablet Take 25 mg by mouth every morning.   . triamcinolone (KENALOG) 0.025 % cream Apply 1 application topically 2 (two) times daily as needed (for itching/rash).    No facility-administered encounter medications on file as of 04/20/2018.      Review of Systems   Is unobtainable secondary to dementia please see HPI  Immunization History  Administered Date(s) Administered  . Influenza-Unspecified 03/20/2016, 05/03/2017  . PPD Test 06/12/2015  . Pneumococcal Conjugate-13 05/03/2017  . Pneumococcal-Unspecified 04/02/2016  . Tdap 03/04/2017   Pertinent  Health Maintenance Due  Topic Date Due  . INFLUENZA VACCINE  Completed  . DEXA SCAN  Completed  . PNA vac Low Risk Adult  Completed   Fall Risk  03/21/2018 03/01/2017  Falls in the past year? No No   Functional Status Survey:    Vitals:   04/20/18 1200  BP: (!) 98/56  Pulse: 66  Resp: 17  Temp: (!) 97.4 F (36.3 C)  TempSrc: Oral  SpO2: 97%  Weight: 102 lb 9.6 oz (46.5 kg)  Height: 5\' 2"  (1.575 m)  Manual blood pressure was 130/66 Body mass index is 18.77 kg/m. Physical Exam   In general this is a somewhat frail elderly female in no distress she appears to be at her baseline she is bright and alert makes eye contact.  Her skin is warm and dry.  Eyes visual acuity appears to be intact she makes eye contact sclera and conjunctive are clear.  Oropharynx is clear mucous membranes moist.  Chest is clear to  auscultation with somewhat poor respiratory effort but she is following verbal commands.  Abdomen is soft nontender with positive bowel sounds.  Heart is regular rate and rhythm without murmur gallop or rub she has quite mild lower extremity edema.  Musculoskeletal appears able to move her extremities at baseline limited exam since she is in bed-she actually will help turn herself in bed.  Neurologic appears grossly intact she is alert she speaks minimally- could not really appreciate lateralizing findings is able to move all extremities.  Psych findings consistent with advancing dementia she does follow simple verbal commands speaks minimally  Labs reviewed: Recent Labs    12/24/17 0415 02/02/18 0700 02/02/18 2131 02/02/18 2136  NA 143 142 143 144  K 3.5 3.7 3.5 3.6  CL 108 105 106 102  CO2 30 29 32  --   GLUCOSE 86 125* 112* 106*  BUN 13 17 21 21   CREATININE 0.65 0.72 0.72  0.80  CALCIUM 8.9 9.0 9.4  --    Recent Labs    11/11/17 0708 12/24/17 0415 02/02/18 2131  AST 27 28 42*  ALT 29 26 35  ALKPHOS 61 53 60  BILITOT 0.7 0.4 0.7  PROT 6.9 5.9* 6.8  ALBUMIN 4.0 3.3* 3.9   Recent Labs    12/24/17 0415 02/02/18 0700 02/02/18 2131 02/02/18 2136  WBC 5.1 5.3 5.8  --   NEUTROABS 1.8 3.3 3.2  --   HGB 12.0 13.6 13.2 13.6  HCT 37.3 41.6 40.9 40.0  MCV 96.9 97.2 96.5  --   PLT 178 164 164  --    Lab Results  Component Value Date   TSH 2.391 02/19/2017   Lab Results  Component Value Date   HGBA1C 5.2 02/03/2018   Lab Results  Component Value Date   CHOL 155 02/03/2018   HDL 61 02/03/2018   LDLCALC 89 02/03/2018   TRIG 27 02/03/2018   CHOLHDL 2.5 02/03/2018    Significant Diagnostic Results in last 30 days:  No results found.  Assessment/Plan  #1 history of dementia thought to be end-stage she continues on Namenda and Aricept- no aggressive interventions desired by family she does have a MOST form  #2 severe malnutrition with weight loss this appears  to have stabilized somewhat with aggressive p.o. encouragement-she is on Remeron- again some decline I suspect will occur because of her advancing dementia family is aware of this continue supportive care.--She is in restorative feeding  3.  History of right femoral fracture with osteoporosis continues on vitamin D with calcium-will update a vitamin D level level back in August was 24.2.  4.-  History depression she continues on Zoloft as well as Remeron.  Again will update a vitamin D level also will update a BMP to monitor for any signs of renal insufficiency hypernatremia- and obtain CBC for updated values-- continue aggressive supportive care.  [id number]:

## 2018-04-21 ENCOUNTER — Encounter (HOSPITAL_COMMUNITY)
Admission: RE | Admit: 2018-04-21 | Discharge: 2018-04-21 | Disposition: A | Discharge status: Home / Self Care | Payer: Medicare Other | Source: Skilled Nursing Facility | Attending: Internal Medicine | Admitting: Internal Medicine

## 2018-04-21 DIAGNOSIS — R239 Unspecified skin changes: Secondary | ICD-10-CM | POA: Insufficient documentation

## 2018-04-21 DIAGNOSIS — E870 Hyperosmolality and hypernatremia: Secondary | ICD-10-CM | POA: Diagnosis not present

## 2018-04-21 DIAGNOSIS — R627 Adult failure to thrive: Secondary | ICD-10-CM | POA: Diagnosis not present

## 2018-04-21 DIAGNOSIS — R633 Feeding difficulties: Secondary | ICD-10-CM | POA: Diagnosis not present

## 2018-04-21 DIAGNOSIS — R293 Abnormal posture: Secondary | ICD-10-CM | POA: Insufficient documentation

## 2018-04-21 DIAGNOSIS — E559 Vitamin D deficiency, unspecified: Secondary | ICD-10-CM | POA: Diagnosis not present

## 2018-04-21 DIAGNOSIS — F0390 Unspecified dementia without behavioral disturbance: Secondary | ICD-10-CM | POA: Diagnosis not present

## 2018-04-21 DIAGNOSIS — R531 Weakness: Secondary | ICD-10-CM | POA: Diagnosis not present

## 2018-04-21 LAB — CBC WITH DIFFERENTIAL/PLATELET
ABS IMMATURE GRANULOCYTES: 0.01 10*3/uL (ref 0.00–0.07)
BASOS ABS: 0 10*3/uL (ref 0.0–0.1)
BASOS PCT: 1 %
EOS ABS: 0.2 10*3/uL (ref 0.0–0.5)
Eosinophils Relative: 5 %
HCT: 39.7 % (ref 36.0–46.0)
Hemoglobin: 12.6 g/dL (ref 12.0–15.0)
Immature Granulocytes: 0 %
Lymphocytes Relative: 38 %
Lymphs Abs: 1.6 10*3/uL (ref 0.7–4.0)
MCH: 30.2 pg (ref 26.0–34.0)
MCHC: 31.7 g/dL (ref 30.0–36.0)
MCV: 95.2 fL (ref 80.0–100.0)
Monocytes Absolute: 0.4 10*3/uL (ref 0.1–1.0)
Monocytes Relative: 9 %
NEUTROS ABS: 2 10*3/uL (ref 1.7–7.7)
NEUTROS PCT: 47 %
NRBC: 0 % (ref 0.0–0.2)
PLATELETS: 138 10*3/uL — AB (ref 150–400)
RBC: 4.17 MIL/uL (ref 3.87–5.11)
RDW: 12.2 % (ref 11.5–15.5)
WBC: 4.2 10*3/uL (ref 4.0–10.5)

## 2018-04-21 LAB — BASIC METABOLIC PANEL
ANION GAP: 6 (ref 5–15)
BUN: 14 mg/dL (ref 8–23)
CO2: 29 mmol/L (ref 22–32)
Calcium: 9.1 mg/dL (ref 8.9–10.3)
Chloride: 103 mmol/L (ref 98–111)
Creatinine, Ser: 0.61 mg/dL (ref 0.44–1.00)
GLUCOSE: 86 mg/dL (ref 70–99)
Potassium: 3.7 mmol/L (ref 3.5–5.1)
SODIUM: 138 mmol/L (ref 135–145)

## 2018-04-22 LAB — VITAMIN D 25 HYDROXY (VIT D DEFICIENCY, FRACTURES): VIT D 25 HYDROXY: 25.8 ng/mL — AB (ref 30.0–100.0)

## 2018-05-05 NOTE — Patient Outreach (Signed)
[facility] obtain [facility]. Patient is currently admitted and am unable to obtain mRs.

## 2018-05-06 ENCOUNTER — Other Ambulatory Visit: Payer: Self-pay

## 2018-05-06 NOTE — Patient Outreach (Signed)
Telephone outreach to patient to obtain mRs was successfully completed. mRs= 5. Patient has been at [facility] since 01/2018. Score was provided by Raeford RazorShontale Hampton, LPN.

## 2018-05-21 DIAGNOSIS — F321 Major depressive disorder, single episode, moderate: Secondary | ICD-10-CM | POA: Diagnosis not present

## 2018-05-21 DIAGNOSIS — F4323 Adjustment disorder with mixed anxiety and depressed mood: Secondary | ICD-10-CM | POA: Diagnosis not present

## 2018-05-21 DIAGNOSIS — F0390 Unspecified dementia without behavioral disturbance: Secondary | ICD-10-CM | POA: Diagnosis not present

## 2018-05-24 ENCOUNTER — Non-Acute Institutional Stay (SKILLED_NURSING_FACILITY): Payer: Medicare Other | Admitting: Internal Medicine

## 2018-05-24 ENCOUNTER — Encounter: Payer: Self-pay | Admitting: Internal Medicine

## 2018-05-24 DIAGNOSIS — E559 Vitamin D deficiency, unspecified: Secondary | ICD-10-CM | POA: Diagnosis not present

## 2018-05-24 DIAGNOSIS — F339 Major depressive disorder, recurrent, unspecified: Secondary | ICD-10-CM | POA: Diagnosis not present

## 2018-05-24 DIAGNOSIS — M810 Age-related osteoporosis without current pathological fracture: Secondary | ICD-10-CM

## 2018-05-24 DIAGNOSIS — R627 Adult failure to thrive: Secondary | ICD-10-CM

## 2018-05-24 NOTE — Progress Notes (Signed)
Location:    [facility] Room Number: 154/D Place of Service:  SNF [phone](31) Provider:  Einar CrowAnjali Gupta MD  Mahlon GammonGupta, Anjali L, MD  Patient Care Team: Mahlon GammonGupta, Anjali L, MD as PCP - General (Internal Medicine) Synetta ShadowLassen, Arlo C, PA-C as Physician Assistant (Internal Medicine)  Extended Emergency Contact Information Primary Emergency Contact: [facility],Shannon Address: [address]          [place], [place] [address] [place] Home Phone: [phone] Mobile Phone: [phone] Relation: Daughter Secondary Emergency Contact: Gena Frayurner,Clark  [place] Mobile Phone: [phone] Relation: Son  Code Status:  DNR Goals of care: Advanced Directive information Advanced Directives 05/24/2018  Does Patient Have a Medical Advance Directive? Yes  Type of Advance Directive Out of facility DNR (pink MOST or yellow form)  Does patient want to make changes to medical advance directive? No - Patient declined  Copy of Healthcare Power of Attorney in Chart? No - copy requested  Would patient like information on creating a medical advance directive? No - Patient declined  Pre-existing out of facility DNR order (yellow form or pink MOST form) -     Chief Complaint  Patient presents with  . Medical Management of Chronic Issues    Routine visit of medical management    HPI:  Pt is a 77 y.o. female seen today for medical management of chronic diseases.    She has h/o Right Femoral Fracture due to fall, Depression and dementia., Osteoporosisand Hypernatremia with Failure to thrive. Vit D Insufficiency  Patient is long term Resident of facility. She does not talk. Is unable to give any history. Her weight has been Fluctuating between 99-100 lbs. Per nurses she sometimes eat and sometimes doesn,t. No New nursing issues.   Past Medical History:  Diagnosis Date  . Acute encephalopathy 2012   In context of diarrhea and clinical dehydration  . Dementia  (HCC)    Noncompliant with Aricept as per daughter  . Protein-calorie malnutrition, severe (HCC)   . Stroke (HCC)   . Underweight   . Vertigo    Past Surgical History:  Procedure Laterality Date  . COMPRESSION HIP SCREW Right 09/21/2015   Procedure: COMPRESSION HIP;  Surgeon: Vickki HearingStanley E Harrison, MD;  Location: [facility];  Service: Orthopedics;  Laterality: Right;    Allergies  Allergen Reactions  . Beef-Derived Products     [facility]    Outpatient Encounter Medications as of 05/24/2018  Medication Sig  . acetaminophen (TYLENOL) 325 MG tablet Take 2 tablets (650 mg total) by mouth every 6 (six) hours as needed for mild pain (or Fever >/= 101).  Marland Kitchen. aspirin EC 325 MG EC tablet Take 1 tablet (325 mg total) by mouth daily with breakfast.  . calcium carbonate (OS-CAL - DOSED IN MG OF ELEMENTAL CALCIUM) 1250 (500 Ca) MG tablet Take 1 tablet by mouth 2 (two) times daily with a meal.  . Cholecalciferol 1000 units tablet Take 2,000 Units by mouth every morning.   . donepezil (ARICEPT) 5 MG tablet Take 5 mg by mouth at bedtime.  . memantine (NAMENDA) 5 MG tablet Take 5 mg by mouth 2 (two) times daily.  . mirtazapine (REMERON) 15 MG tablet Take 7.5 mg by mouth at bedtime.   . senna-docusate (SENOKOT-S) 8.6-50 MG tablet Take 1 tablet by mouth 2 (two) times daily.  . sertraline (ZOLOFT) 25 MG tablet Take 25 mg by mouth every morning.   . triamcinolone (KENALOG) 0.025 % cream  Apply 1 application topically 2 (two) times daily as needed (for itching/rash).    No facility-administered encounter medications on file as of 05/24/2018.      Review of Systems  Unable to perform ROS: Dementia    Immunization History  Administered Date(s) Administered  . Influenza-Unspecified 03/20/2016, 05/03/2017  . PPD Test 06/12/2015  . Pneumococcal Conjugate-13 05/03/2017  . Pneumococcal-Unspecified 04/02/2016  . Tdap 03/04/2017   Pertinent  Health Maintenance Due  Topic Date Due  .  INFLUENZA VACCINE  Completed  . DEXA SCAN  Completed  . PNA vac Low Risk Adult  Completed   Fall Risk  03/21/2018 03/01/2017  Falls in the past year? No No   Functional Status Survey:    Vitals:   05/24/18 1231  BP: 118/74  Pulse: 72  Resp: 19  Temp: 97.6 F (36.4 C)  TempSrc: Oral  Weight: 101 lb 3.2 oz (45.9 kg)  Height: 5\' 2"  (1.575 m)   Body mass index is 18.51 kg/m. Physical Exam  Constitutional: She appears well-developed.  HENT:  Head: Normocephalic.  Mouth/Throat: Oropharynx is clear and moist.  Eyes: Pupils are equal, round, and reactive to light.  Neck: Neck supple.  Cardiovascular: Normal rate and regular rhythm.  No murmur heard. Pulmonary/Chest: Effort normal and breath sounds normal. No stridor. No respiratory distress. She has no wheezes.  Musculoskeletal: She exhibits edema.  Mild edema  Lymphadenopathy:    She has no cervical adenopathy.  Neurological: She is alert.  Patient continues to be Aphasic. Is moving all her Extremities. Alert and respond to some commands not everytime. Seems at her Baseline  Skin: Skin is warm and dry.  Psychiatric: She has a normal mood and affect. Her behavior is normal.    Labs reviewed: Recent Labs    02/02/18 0700 02/02/18 2131 02/02/18 2136 04/21/18 0705  NA 142 143 144 138  K 3.7 3.5 3.6 3.7  CL 105 106 102 103  CO2 29 32  --  29  GLUCOSE 125* 112* 106* 86  BUN 17 21 21 14   CREATININE 0.72 0.72 0.80 0.61  CALCIUM 9.0 9.4  --  9.1   Recent Labs    11/11/17 0708 12/24/17 0415 02/02/18 2131  AST 27 28 42*  ALT 29 26 35  ALKPHOS 61 53 60  BILITOT 0.7 0.4 0.7  PROT 6.9 5.9* 6.8  ALBUMIN 4.0 3.3* 3.9   Recent Labs    02/02/18 0700 02/02/18 2131 02/02/18 2136 04/21/18 0705  WBC 5.3 5.8  --  4.2  NEUTROABS 3.3 3.2  --  2.0  HGB 13.6 13.2 13.6 12.6  HCT 41.6 40.9 40.0 39.7  MCV 97.2 96.5  --  95.2  PLT 164 164  --  138*   Lab Results  Component Value Date   TSH 2.391 02/19/2017   Lab  Results  Component Value Date   HGBA1C 5.2 02/03/2018   Lab Results  Component Value Date   CHOL 155 02/03/2018   HDL 61 02/03/2018   LDLCALC 89 02/03/2018   TRIG 27 02/03/2018   CHOLHDL 2.5 02/03/2018    Significant Diagnostic Results in last 30 days:  No results found.  Assessment/Plan  Dementia with failure to thrive Patient is stable She is on Remeron Will increase her Remeron to 15 mg And Zoloft and supplements Continue on Namenda and Aricept MOST form in the Chart for no Aggressive treatment per daughter Osteoporosis On Vit D and Calcium. Patient did not tolerate Fosamax. If her Weight stays  stable we will consider Prolia Depression Continue on Zoloft and Remeron ? TIA in 08/19 Has been on aspirin Vit D insufficiency On Supplement  Family/ staff Communication:   Labs/tests ordered:    Total time spent in this patient care encounter was _25 minutes; greater than 50% of the visit spent counseling patient, reviewing records , Labs and coordinating care for problems addressed at this encounter.

## 2018-06-08 ENCOUNTER — Encounter: Payer: Self-pay | Admitting: Internal Medicine

## 2018-06-08 ENCOUNTER — Non-Acute Institutional Stay (SKILLED_NURSING_FACILITY): Payer: Medicare Other | Admitting: Internal Medicine

## 2018-06-08 DIAGNOSIS — Z9181 History of falling: Secondary | ICD-10-CM | POA: Diagnosis not present

## 2018-06-08 DIAGNOSIS — M25551 Pain in right hip: Secondary | ICD-10-CM | POA: Diagnosis not present

## 2018-06-08 DIAGNOSIS — R102 Pelvic and perineal pain: Secondary | ICD-10-CM | POA: Diagnosis not present

## 2018-06-08 DIAGNOSIS — E440 Moderate protein-calorie malnutrition: Secondary | ICD-10-CM

## 2018-06-08 DIAGNOSIS — R269 Unspecified abnormalities of gait and mobility: Secondary | ICD-10-CM | POA: Diagnosis not present

## 2018-06-08 DIAGNOSIS — M25552 Pain in left hip: Secondary | ICD-10-CM | POA: Diagnosis not present

## 2018-06-08 NOTE — Progress Notes (Signed)
Location:    [facility] Room Number: 154/D Place of Service:  SNF [phone]) Provider:  Sabino Dick, MD  Patient Care Team: Mahlon Gammon, MD as PCP - General (Internal Medicine) Synetta Shadow as Physician Assistant (Internal Medicine)  Extended Emergency Contact Information Primary Emergency Contact: [facility] Address: [address] [place], [place] [address] [place] Home Phone: [phone] Mobile Phone: [phone] Relation: Daughter Secondary Emergency Contact: Gena Fray [place] Mobile Phone: [phone] Relation: Son  Code Status:  DNR Goals of care: Advanced Directive information Advanced Directives 06/08/2018  Does Patient Have a Medical Advance Directive? Yes  Type of Advance Directive Out of facility DNR (pink MOST or yellow form)  Does patient want to make changes to medical advance directive? No - Patient declined  Copy of Healthcare Power of Attorney in Chart? No - copy requested  Would patient like information on creating a medical advance directive? No - Patient declined  Pre-existing out of facility DNR order (yellow form or pink MOST form) -     Chief Complaint  Patient presents with  . Acute Visit    Fall    HPI:  Pt is a 77 y.o. female seen today for an acute visit for follow-up of a fall.  Patient is a long-term resident of facility with a history of dementia with failure to thrive Remeron actually was just increased- she also has a history of a previous right hip fracture with osteoporosis on vitamin D and calcium did not tolerate Fosamax.  She also has a history of depression and questionable TIA.  She is here for long-term care and family has filled out a MOST form requesting no aggressive interventions.  Apparently she was sitting in her wheelchair earlier today and she slid out of it according to nursing staff- with no apparent  injuries.  She is now in the bed and per discussion with her roommate who watches her very closely she appears to be complaining possibly of some right hip discomfort- when asked if she is having pain she appears to point somewhat in that direction.  Vital signs are stable otherwise she appears to be at her baseline and pain does not appear to be in acute situation but she does point to that area.     Past Medical History:  Diagnosis Date  . Acute encephalopathy 2012   In context of diarrhea and clinical dehydration  . Dementia (HCC)    Noncompliant with Aricept as per daughter  . Protein-calorie malnutrition, severe (HCC)   . Stroke (HCC)   . Underweight   . Vertigo    Past Surgical History:  Procedure Laterality Date  . COMPRESSION HIP SCREW Right 09/21/2015   Procedure: COMPRESSION HIP;  Surgeon: Vickki Hearing, MD;  Location: [facility];  Service: Orthopedics;  Laterality: Right;    Allergies  Allergen Reactions  . Beef-Derived Products     Unknown reaction-Listed on [facility]    Outpatient Encounter Medications as of 06/08/2018  Medication Sig  . acetaminophen (TYLENOL) 325 MG tablet Take 2 tablets (650 mg total) by mouth every 6 (six) hours as needed for mild pain (or Fever >/= 101).  Marland Kitchen aspirin EC 325 MG EC tablet Take 1 tablet (325 mg total) by mouth daily with breakfast.  . calcium carbonate (OS-CAL - DOSED IN MG OF ELEMENTAL CALCIUM) 1250 (500 Ca) MG tablet Take 1  tablet by mouth 2 (two) times daily with a meal.  . Cholecalciferol 1000 units tablet Take 2,000 Units by mouth every morning.   . donepezil (ARICEPT) 5 MG tablet Take 5 mg by mouth at bedtime.  . memantine (NAMENDA) 5 MG tablet Take 5 mg by mouth 2 (two) times daily.  . mirtazapine (REMERON) 15 MG tablet Take 15 mg by mouth at bedtime.   . senna-docusate (SENOKOT-S) 8.6-50 MG tablet Take 1 tablet by mouth 2 (two) times daily.  . sertraline (ZOLOFT) 25 MG tablet Take 25 mg by mouth every morning.   .  triamcinolone (KENALOG) 0.025 % cream Apply 1 application topically 2 (two) times daily as needed (for itching/rash).    No facility-administered encounter medications on file as of 06/08/2018.     Review of Systems   This is limited secondary to aphasia-however she appears to point to her right hip when asked if she is having any discomfort  Immunization History  Administered Date(s) Administered  . Influenza-Unspecified 03/20/2016, 05/03/2017  . PPD Test 06/12/2015  . Pneumococcal Conjugate-13 05/03/2017  . Pneumococcal-Unspecified 04/02/2016  . Tdap 03/04/2017   Pertinent  Health Maintenance Due  Topic Date Due  . INFLUENZA VACCINE  Completed  . DEXA SCAN  Completed  . PNA vac Low Risk Adult  Completed   Fall Risk  03/21/2018 03/01/2017  Falls in the past year? No No   Functional Status Survey:    She is afebrile pulse is 85 respirations 19 blood pressure 132/90 weight appears up about 3 pounds since last month at 202.4 pounds  Physical Exam In general this is a somewhat frail elderly female who appears to be at her baseline resting comfortably in bed.  Her skin is warm and dry I do not note any increased bruising.  She does have a well-healed surgical scar on her right hip.  Eyes visual acuity appears to be intact sclera and conjunctive are clear pupils appear reactive to light.  Oropharynx is clear mucous membranes moist tongue is midline with normal range of motion.  Chest is clear to auscultation with somewhat poor respiratory effort could not appreciate any labored breathing or overt congestion.  Heart is regular rate and rhythm without murmur gallop or rub she has quite mild lower extremity edema.  Her abdomen is soft nontender with positive bowel sounds.  Musculoskeletal does hold her lower extremities and somewhat of a semi-contracted position- could not really appreciate significant grimacing or signs of pain when gentle passive flexion and extension of the  hips bilaterally is done however at times when asked if she is having pain she will point to her right hip.  Could not really appreciate any overt deformities.  Moves her upper extremities at baseline with generalized weakness.  Neurologic as noted above continues with expressive aphasia cranial nerves appear to be intact could not really appreciate lateralizing findings or changes from baseline.  Psych findings consistent with significant dementia she does follow verbal commands seems to be pretty much at her baseline.   Labs reviewed: Recent Labs    02/02/18 0700 02/02/18 2131 02/02/18 2136 04/21/18 0705  NA 142 143 144 138  K 3.7 3.5 3.6 3.7  CL 105 106 102 103  CO2 29 32  --  29  GLUCOSE 125* 112* 106* 86  BUN 17 21 21 14   CREATININE 0.72 0.72 0.80 0.61  CALCIUM 9.0 9.4  --  9.1   Recent Labs    11/11/17 0708 12/24/17 0415 02/02/18 2131  AST 27 28 42*  ALT 29 26 35  ALKPHOS 61 53 60  BILITOT 0.7 0.4 0.7  PROT 6.9 5.9* 6.8  ALBUMIN 4.0 3.3* 3.9   Recent Labs    02/02/18 0700 02/02/18 2131 02/02/18 2136 04/21/18 0705  WBC 5.3 5.8  --  4.2  NEUTROABS 3.3 3.2  --  2.0  HGB 13.6 13.2 13.6 12.6  HCT 41.6 40.9 40.0 39.7  MCV 97.2 96.5  --  95.2  PLT 164 164  --  138*   Lab Results  Component Value Date   TSH 2.391 02/19/2017   Lab Results  Component Value Date   HGBA1C 5.2 02/03/2018   Lab Results  Component Value Date   CHOL 155 02/03/2018   HDL 61 02/03/2018   LDLCALC 89 02/03/2018   TRIG 27 02/03/2018   CHOLHDL 2.5 02/03/2018    Significant Diagnostic Results in last 30 days:  No results found.  Assessment/Plan  #1 gait abnormality with fall in nursing facility-at this point she does not appear to be in any acute discomfort but when asked with prompting she appears to this point to her right hip at times will order x-rays of her hips and pelvis to rule out any acute pathology-she does have a history of a previous right hip fracture and a  history of osteoporosis.  2.  History of weight loss with history of dementia- moderate malnutrition --her Remeron has been increased appears she is gained about 3 pounds in the past month at this point will monitor.  [id number]

## 2018-06-09 ENCOUNTER — Encounter: Payer: Self-pay | Admitting: Internal Medicine

## 2018-06-09 ENCOUNTER — Non-Acute Institutional Stay (SKILLED_NURSING_FACILITY): Payer: Medicare Other | Admitting: Internal Medicine

## 2018-06-09 DIAGNOSIS — K625 Hemorrhage of anus and rectum: Secondary | ICD-10-CM

## 2018-06-09 NOTE — Progress Notes (Signed)
Location:    [facility] Room Number: 154/D Place of Service:  SNF [phone]) Provider:  Sabino DickArlo Venson Ferencz  Gupta, Anjali L, MD  Patient Care Team: Mahlon GammonGupta, Anjali L, MD as PCP - General (Internal Medicine) Synetta ShadowLassen, Katelynd Blauvelt C, PA-C as Physician Assistant (Internal Medicine)  Extended Emergency Contact Information Primary Emergency Contact: [facility]Shannon Address: [address]          [place], [place] [address] [place] Home Phone: [phone] Mobile Phone: [phone] Relation: Daughter Secondary Emergency Contact: Gena Frayurner,Clark  [place] Mobile Phone: [phone] Relation: Son  Code Status:  DNR Goals of care: Advanced Directive information Advanced Directives 06/09/2018  Does Patient Have a Medical Advance Directive? Yes  Type of Advance Directive Out of facility DNR (pink MOST or yellow form)  Does patient want to make changes to medical advance directive? No - Patient declined  Copy of Healthcare Power of Attorney in Chart? No - copy requested  Would patient like information on creating a medical advance directive? No - Patient declined  Pre-existing out of facility DNR order (yellow form or pink MOST form) -     Chief Complaint  Patient presents with  . Acute Visit    Rectal Bleeding  Apparently a short episode of blood in the stool while patient was having a bowel movement this morning  HPI:  Pt is a 77 y.o. female seen today for an acute visit for possible small amount of rectal bleeding.  Patient is a long-term resident of facility with a history of dementia as well as failure to thrive and a history of right hip fracture with osteoporosis.  She also has a history of depression and questionable history of TIAs she is on aspirin.  Apparently she slid out of her wheelchair yesterday and later complained it appears of possibly some mild hip pain-however x-rays apparently were negative for any acute process and she is  not complaining of that pain today.  However apparently while having a bowel movement late this morning her nursing tech noticed that there was a small amount of blood streaks in the stool-it was thought this was possibly caused by straining at the stool.  When I evaluated patient she did have a successful bowel movement I cannot really appreciate any overt bleeding but on rectal exam there also appear to be a streak of blood at that time at the rectal opening  Vital signs are stable she does not appear to be in any discomfort.    Past Medical History:  Diagnosis Date  . Acute encephalopathy 2012   In context of diarrhea and clinical dehydration  . Dementia (HCC)    Noncompliant with Aricept as per daughter  . Protein-calorie malnutrition, severe (HCC)   . Stroke (HCC)   . Underweight   . Vertigo    Past Surgical History:  Procedure Laterality Date  . COMPRESSION HIP SCREW Right 09/21/2015   Procedure: COMPRESSION HIP;  Surgeon: Vickki HearingStanley E Harrison, MD;  Location: [facility];  Service: Orthopedics;  Laterality: Right;    Allergies  Allergen Reactions  . Beef-Derived Products     [facility]    Outpatient Encounter Medications as of 06/09/2018  Medication Sig  . acetaminophen (TYLENOL) 325 MG tablet Take 2 tablets (650 mg total) by mouth every 6 (six) hours as needed for mild pain (or Fever >/= 101).  Marland Kitchen. aspirin EC 325 MG EC tablet Take 1 tablet (325 mg total) by mouth daily with  breakfast.  . calcium carbonate (OS-CAL - DOSED IN MG OF ELEMENTAL CALCIUM) 1250 (500 Ca) MG tablet Take 1 tablet by mouth 2 (two) times daily with a meal.  . Cholecalciferol 1000 units tablet Take 2,000 Units by mouth every morning.   . donepezil (ARICEPT) 5 MG tablet Take 5 mg by mouth at bedtime.  . memantine (NAMENDA) 5 MG tablet Take 5 mg by mouth 2 (two) times daily.  . mirtazapine (REMERON) 15 MG tablet Take 15 mg by mouth at bedtime.   . senna-docusate (SENOKOT-S) 8.6-50 MG tablet  Take 1 tablet by mouth 2 (two) times daily.  . sertraline (ZOLOFT) 25 MG tablet Take 25 mg by mouth every morning.   . triamcinolone (KENALOG) 0.025 % cream Apply 1 application topically 2 (two) times daily as needed (for itching/rash).    No facility-administered encounter medications on file as of 06/09/2018.     Review of Systems   This is largely unobtainable secondary to dementia please see HPI she is not complaining of any abdominal pain or discomfort and not complain of chest pain or shortness of breath is not really complaining of hip pain today  Immunization History  Administered Date(s) Administered  . Influenza-Unspecified 03/20/2016, 05/03/2017  . PPD Test 06/12/2015  . Pneumococcal Conjugate-13 05/03/2017  . Pneumococcal-Unspecified 04/02/2016  . Tdap 03/04/2017   Pertinent  Health Maintenance Due  Topic Date Due  . INFLUENZA VACCINE  Completed  . DEXA SCAN  Completed  . PNA vac Low Risk Adult  Completed   Fall Risk  03/21/2018 03/01/2017  Falls in the past year? No No   Functional Status Survey:    Vitals:   06/09/18 1415  BP: 118/70  Pulse: 62  Resp: 18  Temp: 98 F (36.7 C)  TempSrc: Oral    Physical Exam   In general this is a frail but stable appearing elderly female in no distress.  Her skin is warm and dry I do not see any increased bruising or bleeding.  Eyes visual acuity appears to be intact sclera and conjunctive are clear she has prescription lenses.  Oropharynx is clear mucous membranes moist.  Chest is clear to auscultation with somewhat shallow air entry there is no labored breathing.  Heart is regular rate and rhythm without murmur gallop or rub she has trace lower extremity edema.  Abdomen is soft nontender with positive bowel sounds.  Rectal exam I cannot really appreciate any overt bleeding stool was solid in nature-however expection of the rectal opening I did see a couple blood-tinged streaks.  Musculoskeletal appears to be at  baseline she is able to stand with assistance when she is receiving personal care.  Psych she is oriented to self -- is pleasant actually was smiling later when she was in bed.  Will respond to verbal commands and talks occasionally  Labs reviewed: Recent Labs    02/02/18 0700 02/02/18 2131 02/02/18 2136 04/21/18 0705  NA 142 143 144 138  K 3.7 3.5 3.6 3.7  CL 105 106 102 103  CO2 29 32  --  29  GLUCOSE 125* 112* 106* 86  BUN 17 21 21 14   CREATININE 0.72 0.72 0.80 0.61  CALCIUM 9.0 9.4  --  9.1   Recent Labs    11/11/17 0708 12/24/17 0415 02/02/18 2131  AST 27 28 42*  ALT 29 26 35  ALKPHOS 61 53 60  BILITOT 0.7 0.4 0.7  PROT 6.9 5.9* 6.8  ALBUMIN 4.0 3.3* 3.9  Recent Labs    02/02/18 0700 02/02/18 2131 02/02/18 2136 04/21/18 0705  WBC 5.3 5.8  --  4.2  NEUTROABS 3.3 3.2  --  2.0  HGB 13.6 13.2 13.6 12.6  HCT 41.6 40.9 40.0 39.7  MCV 97.2 96.5  --  95.2  PLT 164 164  --  138*   Lab Results  Component Value Date   TSH 2.391 02/19/2017   Lab Results  Component Value Date   HGBA1C 5.2 02/03/2018   Lab Results  Component Value Date   CHOL 155 02/03/2018   HDL 61 02/03/2018   LDLCALC 89 02/03/2018   TRIG 27 02/03/2018   CHOLHDL 2.5 02/03/2018    Significant Diagnostic Results in last 30 days:  No results found.  Assessment/Plan  #1- small amount of rectal bleeding status post bowel movement-this could be more from straining- nursing staff will continue to monitor- she is on Senokot twice a day we may have to add to the regimen- will monitor she appeared to have a successful bowel movement when I was in the room later.  Will at this point hold her aspirin temporarily pending lab results we will order CBC and metabolic panel.  If there is no further bleeding and lab results are fairly stable suspect we may restart aspirin possibly at a lower dose for a few days to make sure there is no further bleeding.  Also monitor vital signs pulse ox every shift  for 24 hours to keep an eye on this notify provider of any further rectal bleeding.  Clinically she appears to be at baseline actually appears to be in good spirits when I examined her.  I suspect any followed by GI for possible colonoscopy would be problematic with her advanced dementia and general frailty but will await how she does clinically.  [id number].  Addendum June 10, 2018-we have obtain updated lab work which shows stability relatively of her hemoglobin of 12--platelet count is 166 which actually show some improvement.  Her white count is within normal range  Metabolic panel also shows stability creatinine of 0.66 BUN of 14 sodium of 140 potassium 3.9.  Again will continue with the above-stated plans and monitor.  I did state to nursing to start low-dose aspirin at this point also will add a proton pump inhibitor.

## 2018-06-10 ENCOUNTER — Encounter (HOSPITAL_COMMUNITY)
Admission: RE | Admit: 2018-06-10 | Discharge: 2018-06-10 | Disposition: A | Discharge status: Home / Self Care | Payer: Medicare Other | Source: Skilled Nursing Facility | Attending: Internal Medicine | Admitting: Internal Medicine

## 2018-06-10 DIAGNOSIS — F0390 Unspecified dementia without behavioral disturbance: Secondary | ICD-10-CM | POA: Insufficient documentation

## 2018-06-10 DIAGNOSIS — E43 Unspecified severe protein-calorie malnutrition: Secondary | ICD-10-CM | POA: Diagnosis not present

## 2018-06-10 DIAGNOSIS — F329 Major depressive disorder, single episode, unspecified: Secondary | ICD-10-CM | POA: Insufficient documentation

## 2018-06-10 LAB — CBC WITH DIFFERENTIAL/PLATELET
Abs Immature Granulocytes: 0.01 10*3/uL (ref 0.00–0.07)
Basophils Absolute: 0 10*3/uL (ref 0.0–0.1)
Basophils Relative: 1 %
Eosinophils Absolute: 0.2 10*3/uL (ref 0.0–0.5)
Eosinophils Relative: 5 %
HCT: 37.6 % (ref 36.0–46.0)
HEMOGLOBIN: 12 g/dL (ref 12.0–15.0)
Immature Granulocytes: 0 %
LYMPHS PCT: 41 %
Lymphs Abs: 2.1 10*3/uL (ref 0.7–4.0)
MCH: 31.7 pg (ref 26.0–34.0)
MCHC: 31.9 g/dL (ref 30.0–36.0)
MCV: 99.5 fL (ref 80.0–100.0)
MONO ABS: 0.4 10*3/uL (ref 0.1–1.0)
MONOS PCT: 7 %
Neutro Abs: 2.4 10*3/uL (ref 1.7–7.7)
Neutrophils Relative %: 46 %
Platelets: 166 10*3/uL (ref 150–400)
RBC: 3.78 MIL/uL — AB (ref 3.87–5.11)
RDW: 12.4 % (ref 11.5–15.5)
WBC: 5.1 10*3/uL (ref 4.0–10.5)
nRBC: 0 % (ref 0.0–0.2)

## 2018-06-10 LAB — BASIC METABOLIC PANEL
Anion gap: 6 (ref 5–15)
BUN: 14 mg/dL (ref 8–23)
CHLORIDE: 104 mmol/L (ref 98–111)
CO2: 30 mmol/L (ref 22–32)
CREATININE: 0.66 mg/dL (ref 0.44–1.00)
Calcium: 9.1 mg/dL (ref 8.9–10.3)
GFR calc Af Amer: >60 mL/min (ref >60)
GFR calc non Af Amer: >60 mL/min (ref >60)
GLUCOSE: 89 mg/dL (ref 70–99)
POTASSIUM: 3.9 mmol/L (ref 3.5–5.1)
SODIUM: 140 mmol/L (ref 135–145)

## 2018-07-28 DIAGNOSIS — L603 Nail dystrophy: Secondary | ICD-10-CM | POA: Diagnosis not present

## 2018-07-28 DIAGNOSIS — I739 Peripheral vascular disease, unspecified: Secondary | ICD-10-CM | POA: Diagnosis not present

## 2018-07-28 DIAGNOSIS — B351 Tinea unguium: Secondary | ICD-10-CM | POA: Diagnosis not present

## 2018-08-08 ENCOUNTER — Non-Acute Institutional Stay (SKILLED_NURSING_FACILITY): Payer: Medicare Other | Admitting: Adult Health

## 2018-08-08 ENCOUNTER — Encounter: Payer: Self-pay | Admitting: Adult Health

## 2018-08-08 DIAGNOSIS — F339 Major depressive disorder, recurrent, unspecified: Secondary | ICD-10-CM | POA: Diagnosis not present

## 2018-08-08 DIAGNOSIS — R627 Adult failure to thrive: Secondary | ICD-10-CM | POA: Diagnosis not present

## 2018-08-08 DIAGNOSIS — F0150 Vascular dementia without behavioral disturbance: Secondary | ICD-10-CM | POA: Diagnosis not present

## 2018-08-08 DIAGNOSIS — I639 Cerebral infarction, unspecified: Secondary | ICD-10-CM | POA: Diagnosis not present

## 2018-08-08 DIAGNOSIS — K219 Gastro-esophageal reflux disease without esophagitis: Secondary | ICD-10-CM

## 2018-08-08 DIAGNOSIS — K5909 Other constipation: Secondary | ICD-10-CM | POA: Diagnosis not present

## 2018-08-08 DIAGNOSIS — R1312 Dysphagia, oropharyngeal phase: Secondary | ICD-10-CM | POA: Insufficient documentation

## 2018-08-08 NOTE — Progress Notes (Signed)
Location:   [facility] Room Number: 154 D Place of Service:  SNF (31)   CODE STATUS: DNR  Allergies  Allergen Reactions  . Beef-Derived Products     [facility]    Chief Complaint  Patient presents with  . Medical Management of Chronic Issues    Cerebrovascular accident(CVA) unspecified mechanism; vascular dementia without behavioral disturbance; gerd without esophagitis.     HPI:  She is a 78 year old long term resident of this facility being seen for the management of her chronic illnesses: cva; dementia; gerd. There are no reports of changes in appetite; however; she is experiencing a slow progressive weight loss. There are no signs of uncontrolled pain; no reports of agitation or anxiety.   Past Medical History:  Diagnosis Date  . Acute encephalopathy 2012   In context of diarrhea and clinical dehydration  . Dementia (HCC)    Noncompliant with Aricept as per daughter  . Protein-calorie malnutrition, severe (HCC)   . Stroke (HCC)   . Underweight   . Vertigo     Past Surgical History:  Procedure Laterality Date  . COMPRESSION HIP SCREW Right 09/21/2015   Procedure: COMPRESSION HIP;  Surgeon: Vickki HearingStanley E Harrison, MD;  Location: [facility];  Service: Orthopedics;  Laterality: Right;    Social History   Socioeconomic History  . Marital status: Widowed    Spouse name: Not on file  . Number of children: Not on file  . Years of education: Not on file  . Highest education level: Not on file  Occupational History  . Not on file  Social Needs  . Financial resource strain: Not on file  . Food insecurity:    Worry: Not on file    Inability: Not on file  . Transportation needs:    Medical: Not on file    Non-medical: Not on file  Tobacco Use  . Smoking status: Never Smoker  . Smokeless tobacco: Never Used  Substance and Sexual Activity  . Alcohol use: Yes    Comment: occasionally  . Drug use: No  . Sexual activity:  Never  Lifestyle  . Physical activity:    Days per week: Not on file    Minutes per session: Not on file  . Stress: Not on file  Relationships  . Social connections:    Talks on phone: Not on file    Gets together: Not on file    Attends religious service: Not on file    Active member of club or organization: Not on file    Attends meetings of clubs or organizations: Not on file    Relationship status: Not on file  . Intimate partner violence:    Fear of current or ex partner: Not on file    Emotionally abused: Not on file    Physically abused: Not on file    Forced sexual activity: Not on file  Other Topics Concern  . Not on file  Social History Narrative  . Not on file   Family History  Problem Relation Age of Onset  . Dementia Mother   . Cancer Paternal Grandmother        of upper extremity  . Heart disease Neg Hx   . Diabetes Neg Hx   . Stroke Neg Hx       VITAL SIGNS BP 112/62   Pulse 60   Temp (!) 97.5 F (36.4 C)   Resp 17   Ht 5\' 2"  (  1.575 m)   Wt 101 lb 3.2 oz (45.9 kg)   BMI 18.51 kg/m   Outpatient Encounter Medications as of 08/08/2018  Medication Sig  . acetaminophen (TYLENOL) 325 MG tablet Take 2 tablets (650 mg total) by mouth every 6 (six) hours as needed for mild pain (or Fever >/= 101).  Marland Kitchen aspirin 81 MG chewable tablet Chew 81 mg by mouth daily.  . calcium carbonate (OS-CAL - DOSED IN MG OF ELEMENTAL CALCIUM) 1250 (500 Ca) MG tablet Take 1 tablet by mouth 2 (two) times daily with a meal.  . Cholecalciferol 125 MCG (5000 UT) TABS Take 5,000 Units by mouth daily.   Marland Kitchen donepezil (ARICEPT) 5 MG tablet Take 5 mg by mouth at bedtime.  . memantine (NAMENDA) 5 MG tablet Take 5 mg by mouth 2 (two) times daily.  . mirtazapine (REMERON) 15 MG tablet Take 15 mg by mouth at bedtime.   . NON FORMULARY Diet Type: Upgrade patient to nectar thick liquids,  continue dysphagia 2 diet  . omeprazole (PRILOSEC) 20 MG capsule Take 20 mg by mouth daily.  Marland Kitchen  senna-docusate (SENOKOT-S) 8.6-50 MG tablet Take 1 tablet by mouth 2 (two) times daily.  . sertraline (ZOLOFT) 25 MG tablet Take 25 mg by mouth every morning.   . triamcinolone (KENALOG) 0.025 % cream Apply 1 application topically 2 (two) times daily as needed (for itching/rash).   . [DISCONTINUED] aspirin EC 325 MG EC tablet Take 1 tablet (325 mg total) by mouth daily with breakfast. (Patient not taking: Reported on 08/08/2018)   No facility-administered encounter medications on file as of 08/08/2018.      SIGNIFICANT DIAGNOSTIC EXAMS  NONE RECENT  LABS REVIEWED TODAY:   04-21-18: wbc 4.2; hgb 12.6; hct 39.7; mcv 95.2; plt 138; glucose 86; bun 14; creat 0.61; k+ 3.7; na++ 138; ca 9.1 vit D 25.8 06-10-18: wbc 5.1; hgb 12.0; hct 37.6; mcv 99.5; plt 166 glucose 89; bun 14; creat 0.66; k+ 3.9; na++ 140; ca 9.1.   Review of Systems  Unable to perform ROS: Dementia (unable to participate )   Physical Exam Constitutional:      General: She is not in acute distress.    Appearance: She is well-developed. She is not diaphoretic.     Comments: Frail   Neck:     Thyroid: No thyromegaly.  Cardiovascular:     Rate and Rhythm: Normal rate and regular rhythm.     Heart sounds: Normal heart sounds.  Pulmonary:     Effort: Pulmonary effort is normal. No respiratory distress.     Breath sounds: Normal breath sounds.  Abdominal:     General: Bowel sounds are normal. There is no distension.     Palpations: Abdomen is soft.     Tenderness: There is no abdominal tenderness.  Musculoskeletal:     Right lower leg: No edema.     Left lower leg: No edema.     Comments: Is able to move all extremities Has left hand tremor and contracture  Mild kyphosis History of right hip compression screw 2017  Lymphadenopathy:     Cervical: No cervical adenopathy.  Skin:    General: Skin is warm and dry.  Neurological:     Mental Status: She is alert. Mental status is at baseline.  Psychiatric:        Mood  and Affect: Mood normal.       ASSESSMENT/ PLAN:  TODAY:   1. CVA (cerebral vascular accident) is stable will continue  asa 81 mg daily   2. Vascular dementia without behavioral disturbance: is without significant changes weight is 101 pounds; will continue namenda 5 mg twice daily will stop aricept due to low body weight   3. GERD without esophagitis: is stable will continue prilosec 20 mg daily   4. Chronic constipation: is stable senna s twice daily   5. Dysphagia oropharyngeal phase: is without signs of infection present will continue nectar thick liquids.   6. Recurrent depression: on signs of agitation or depression present will continue zoloft 25 mg daily and remeron 15 mg daily   7. Failure to thrive in adult: is without change: slow weight loss: in Aug 2019: 105 pounds current weight 101 pounds will continue supplements as directed  Will get liver function to assess nutritional status.   MD is aware of resident's narcotic use and is in agreement with current plan of care. We will attempt to wean resident as apropriate   Synthia Innocent NP [facility]  Contact [phone] Monday through Friday 8am- 5pm  After hours call [phone]

## 2018-08-09 ENCOUNTER — Encounter (HOSPITAL_COMMUNITY)
Admission: RE | Admit: 2018-08-09 | Discharge: 2018-08-09 | Disposition: A | Discharge status: Home / Self Care | Payer: Medicare Other | Source: Skilled Nursing Facility | Attending: Internal Medicine | Admitting: Internal Medicine

## 2018-08-09 DIAGNOSIS — F329 Major depressive disorder, single episode, unspecified: Secondary | ICD-10-CM | POA: Diagnosis not present

## 2018-08-09 DIAGNOSIS — F0390 Unspecified dementia without behavioral disturbance: Secondary | ICD-10-CM | POA: Diagnosis not present

## 2018-08-09 DIAGNOSIS — E43 Unspecified severe protein-calorie malnutrition: Secondary | ICD-10-CM | POA: Insufficient documentation

## 2018-08-09 LAB — HEPATIC FUNCTION PANEL
ALK PHOS: 54 U/L (ref 38–126)
ALT: 23 U/L (ref 0–44)
AST: 26 U/L (ref 15–41)
Albumin: 3.9 g/dL (ref 3.5–5.0)
BILIRUBIN DIRECT: 0.1 mg/dL (ref 0.0–0.2)
BILIRUBIN INDIRECT: 0.4 mg/dL (ref 0.3–0.9)
BILIRUBIN TOTAL: 0.5 mg/dL (ref 0.3–1.2)
Total Protein: 6.9 g/dL (ref 6.5–8.1)

## 2018-09-08 ENCOUNTER — Encounter: Payer: Self-pay | Admitting: Adult Health

## 2018-09-08 ENCOUNTER — Non-Acute Institutional Stay (SKILLED_NURSING_FACILITY): Payer: Medicare Other | Admitting: Adult Health

## 2018-09-08 DIAGNOSIS — K5909 Other constipation: Secondary | ICD-10-CM

## 2018-09-08 DIAGNOSIS — I639 Cerebral infarction, unspecified: Secondary | ICD-10-CM | POA: Diagnosis not present

## 2018-09-08 DIAGNOSIS — K219 Gastro-esophageal reflux disease without esophagitis: Secondary | ICD-10-CM

## 2018-09-08 DIAGNOSIS — F0150 Vascular dementia without behavioral disturbance: Secondary | ICD-10-CM | POA: Diagnosis not present

## 2018-09-08 NOTE — Progress Notes (Signed)
Provider:  Synthia Innocent, NP Location:  [facility] Room Number: 154 D Place of Service:  SNF (31)   PCP: Mahlon Gammon, MD Patient Care Team: Mahlon Gammon, MD as PCP - General (Internal Medicine) Roena Malady, PA-C as Physician Assistant (Internal Medicine)  Extended Emergency Contact Information Primary Emergency Contact: [facility] Address: [address] [place], [place] [address] [place] Home Phone: [phone] Mobile Phone: [phone] Relation: Daughter Secondary Emergency Contact: Gena Fray [place] of [place] Mobile Phone: [phone] Relation: Son  Code Status: DNR Goals of Care: Advanced Directive information Advanced Directives 09/08/2018  Does Patient Have a Medical Advance Directive? Yes  Type of Advance Directive Out of facility DNR (pink MOST or yellow form)  Does patient want to make changes to medical advance directive? No - Patient declined  Copy of Healthcare Power of Attorney in Chart? -  Would patient like information on creating a medical advance directive? No - Patient declined  Pre-existing out of facility DNR order (yellow form or pink MOST form) Yellow form placed in chart (order not valid for inpatient use)      Allergies  Allergen Reactions  . Beef-Derived Products     Unknown reaction-Listed on [facility]     Chief Complaint  Patient presents with  . Annual Exam        HPI: Patient is a 78 y.o. female seen today for an annual comprehensive examination. She has been hospitalized in Aug of this past year for an acute CVA. There are no reports of significant weight loss. There are no reports of uncontrolled pain; no reports of agitation; no anxiety. She continues to be followed for her chronic illnesses including: cva; dementia; gerd constipation.   Past Medical History:  Diagnosis Date  . Acute encephalopathy 2012   In context of diarrhea and clinical dehydration   . Dementia (HCC)    Noncompliant with Aricept as per daughter  . Protein-calorie malnutrition, severe (HCC)   . Stroke (HCC)   . Underweight   . Vertigo    Past Surgical History:  Procedure Laterality Date  . COMPRESSION HIP SCREW Right 09/21/2015   Procedure: COMPRESSION HIP;  Surgeon: Vickki Hearing, MD;  Location: [facility];  Service: Orthopedics;  Laterality: Right;    reports that she has never smoked. She has never used smokeless tobacco. She reports current alcohol use. She reports that she does not use drugs. Social History   Socioeconomic History  . Marital status: Widowed    Spouse name: Not on file  . Number of children: Not on file  . Years of education: Not on file  . Highest education level: Not on file  Occupational History  . Not on file  Social Needs  . Financial resource strain: Not on file  . Food insecurity:    Worry: Not on file    Inability: Not on file  . Transportation needs:    Medical: Not on file    Non-medical: Not on file  Tobacco Use  . Smoking status: Never Smoker  . Smokeless tobacco: Never Used  Substance and Sexual Activity  . Alcohol use: Yes    Comment: occasionally  . Drug use: No  . Sexual activity: Never  Lifestyle  . Physical activity:    Days per week: Not on file    Minutes per session: Not on file  . Stress: Not on file  Relationships  .  Social connections:    Talks on phone: Not on file    Gets together: Not on file    Attends religious service: Not on file    Active member of club or organization: Not on file    Attends meetings of clubs or organizations: Not on file    Relationship status: Not on file  . Intimate partner violence:    Fear of current or ex partner: Not on file    Emotionally abused: Not on file    Physically abused: Not on file    Forced sexual activity: Not on file  Other Topics Concern  . Not on file  Social History Narrative  . Not on file   Family History  Problem Relation Age of Onset  .  Dementia Mother   . Cancer Paternal Grandmother        of upper extremity  . Heart disease Neg Hx   . Diabetes Neg Hx   . Stroke Neg Hx     Vitals:   09/08/18 1518  BP: (!) 112/56  Pulse: 62  Resp: 16  Temp: (!) 97.1 F (36.2 C)  Weight: 98 lb 6.4 oz (44.6 kg)  Height:  (1.575 m)   Body mass index is 18 kg/m.   Outpatient Encounter Medications as of 09/08/2018  Medication Sig  . acetaminophen (TYLENOL) 325 MG tablet Take 2 tablets (650 mg total) by mouth every 6 (six) hours as needed for mild pain (or Fever >/= 101).  Marland Kitchen aspirin 81 MG chewable tablet Chew 81 mg by mouth daily.  . calcium carbonate (OS-CAL - DOSED IN MG OF ELEMENTAL CALCIUM) 1250 (500 Ca) MG tablet Take 1 tablet by mouth 2 (two) times daily with a meal.  . Cholecalciferol 125 MCG (5000 UT) TABS Take 5,000 Units by mouth daily.   . memantine (NAMENDA) 5 MG tablet Take 5 mg by mouth 2 (two) times daily.  . mirtazapine (REMERON) 15 MG tablet Take 15 mg by mouth at bedtime.   . NON FORMULARY Diet Type: Upgrade patient to nectar thick liquids,  continue dysphagia 2 diet  . omeprazole (PRILOSEC) 20 MG capsule Take 20 mg by mouth daily.  Marland Kitchen senna-docusate (SENOKOT-S) 8.6-50 MG tablet Take 1 tablet by mouth 2 (two) times daily.  . sertraline (ZOLOFT) 25 MG tablet Take 25 mg by mouth every morning.   . triamcinolone (KENALOG) 0.025 % cream Apply 1 application topically 2 (two) times daily as needed (for itching/rash).   . [DISCONTINUED] donepezil (ARICEPT) 5 MG tablet Take 5 mg by mouth at bedtime.   No facility-administered encounter medications on file as of 09/08/2018.      SIGNIFICANT DIAGNOSTIC EXAMS   LABS REVIEWED PREVIOUS:   04-21-18: wbc 4.2; hgb 12.6; hct 39.7; mcv 95.2; plt 138; glucose 86; bun 14; creat 0.61; k+ 3.7; na++ 138; ca 9.1 vit D 25.8 06-10-18: wbc 5.1; hgb 12.0; hct 37.6; mcv 99.5; plt 166 glucose 89; bun 14; creat 0.66; k+ 3.9; na++ 140; ca 9.1.   TODAY:   08-09-18: liver normal  albumin 3.9  Review of Systems  Unable to perform ROS: Dementia (unable to participate )    Physical Exam Constitutional:      General: She is not in acute distress.    Appearance: She is underweight. She is not diaphoretic.     Comments: Frail   Neck:     Musculoskeletal: Neck supple.     Thyroid: No thyromegaly.  Cardiovascular:     Rate and  Rhythm: Normal rate and regular rhythm.     Pulses: Normal pulses.     Heart sounds: Normal heart sounds.  Pulmonary:     Effort: Pulmonary effort is normal. No respiratory distress.     Breath sounds: Normal breath sounds.  Abdominal:     General: Bowel sounds are normal. There is no distension.     Palpations: Abdomen is soft.     Tenderness: There is no abdominal tenderness.  Musculoskeletal:     Right lower leg: No edema.     Left lower leg: No edema.     Comments: Is able to move all extremities Has left hand tremor and contracture  Mild kyphosis History of right hip compression screw 2017   Lymphadenopathy:     Cervical: No cervical adenopathy.  Skin:    General: Skin is warm and dry.  Neurological:     Mental Status: She is alert. Mental status is at baseline.  Psychiatric:        Mood and Affect: Mood normal.       ASSESSMENT/ PLAN:  TODAY:   1. CVA (cerebral vascular accident) is stable will continue asa 81 mg daily   2. Vascular dementia without behavioral disturbance: is without significant change: weight is 98 (previous 101) pounds; her aricept was stopped due to her body weight. Will continue namenda 5 mg twice daily she is losing weight which is an unfortunate outcome in the late stages of this disease   3. GERD without esophagitis: is table will continue prilosec 20 mg daily  4. Chronic constipation: is stable senna s twice daily   5. Dysphagia oropharyngeal phase: is without signs of infection present will continue nectar thick liquids.   6. Recurrent depression: on signs of agitation or depression  present will continue zoloft 25 mg daily and remeron 15 mg daily   7. Failure to thrive in adult: is without change: slow weight loss: in Aug 2019: 105 pounds current weight 98 pounds will continue supplements as directed  Albumin is 3.9   Her health maintenance is up to date.   Will check vit D level    MD is aware of resident's narcotic use and is in agreement with current plan of care. We will wean dosage as appropriate for resident   Synthia Innocent NP [facility]  Contact [phone] Monday through Friday 8am- 5pm  After hours call [phone]

## 2018-09-09 ENCOUNTER — Encounter (HOSPITAL_COMMUNITY)
Admission: RE | Admit: 2018-09-09 | Discharge: 2018-09-09 | Disposition: A | Discharge status: Home / Self Care | Payer: Medicare Other | Source: Skilled Nursing Facility | Attending: Adult Health | Admitting: Adult Health

## 2018-09-09 DIAGNOSIS — E43 Unspecified severe protein-calorie malnutrition: Secondary | ICD-10-CM | POA: Diagnosis not present

## 2018-09-09 DIAGNOSIS — F329 Major depressive disorder, single episode, unspecified: Secondary | ICD-10-CM | POA: Diagnosis not present

## 2018-09-09 DIAGNOSIS — R293 Abnormal posture: Secondary | ICD-10-CM | POA: Insufficient documentation

## 2018-09-09 DIAGNOSIS — F0390 Unspecified dementia without behavioral disturbance: Secondary | ICD-10-CM | POA: Insufficient documentation

## 2018-09-10 LAB — VITAMIN D 25 HYDROXY (VIT D DEFICIENCY, FRACTURES): Vit D, 25-Hydroxy: 34.5 ng/mL (ref 30.0–100.0)

## 2018-10-03 ENCOUNTER — Non-Acute Institutional Stay (SKILLED_NURSING_FACILITY): Payer: Medicare Other | Admitting: Internal Medicine

## 2018-10-03 ENCOUNTER — Encounter: Payer: Self-pay | Admitting: Internal Medicine

## 2018-10-03 DIAGNOSIS — M810 Age-related osteoporosis without current pathological fracture: Secondary | ICD-10-CM | POA: Diagnosis not present

## 2018-10-03 DIAGNOSIS — F0150 Vascular dementia without behavioral disturbance: Secondary | ICD-10-CM | POA: Diagnosis not present

## 2018-10-03 DIAGNOSIS — R627 Adult failure to thrive: Secondary | ICD-10-CM | POA: Diagnosis not present

## 2018-10-03 DIAGNOSIS — F339 Major depressive disorder, recurrent, unspecified: Secondary | ICD-10-CM | POA: Diagnosis not present

## 2018-10-03 NOTE — Progress Notes (Signed)
Location:    [facility] Room Number: 148/D Place of Service:  SNF [phone](31) Provider: Einar Crow  MD  Mahlon Gammon,  L, MD  Patient Care Team: Mahlon Gammon,  L, MD as PCP - General (Internal Medicine) Synetta ShadowLassen, Arlo C, PA-C as Physician Assistant (Internal Medicine)  Extended Emergency Contact Information Primary Emergency Contact: [facility],Shannon Address: [address]          [place], [place] [address] [place] Home Phone: [phone] Mobile Phone: [phone] Relation: Daughter Secondary Emergency Contact: Gena Frayurner,Clark  [place] Mobile Phone: [phone] Relation: Son  Code Status:  DNR Goals of care: Advanced Directive information Advanced Directives 10/03/2018  Does Patient Have a Medical Advance Directive? Yes  Type of Advance Directive Out of facility DNR (pink MOST or yellow form)  Does patient want to make changes to medical advance directive? No - Patient declined  Copy of Healthcare Power of Attorney in Chart? No - copy requested  Would patient like information on creating a medical advance directive? No - Patient declined  Pre-existing out of facility DNR order (yellow form or pink MOST form) -     Chief Complaint  Patient presents with  . Medical Management of Chronic Issues    Routine visit of medical management    HPI:  Pt is a 78 y.o. female seen today for medical management of chronic diseases.   She has h/o Right Femoral Fracture due to fall, Depression and dementia., Osteoporosisand Hypernatremia with Failure to thrive. Vit D Insufficiency  Patient is long term resident of facility. She does not talk but was more responsive today. She follows some commands but mostly just Follows me by her eyes. She is wheelchair bound. Her Appetite fluctuates Weight is stable No Nursing issues  Past Medical History:  Diagnosis Date  . Acute encephalopathy 2012   In context of diarrhea and clinical  dehydration  . Dementia (HCC)    Noncompliant with Aricept as per daughter  . Protein-calorie malnutrition, severe (HCC)   . Stroke (HCC)   . Underweight   . Vertigo    Past Surgical History:  Procedure Laterality Date  . COMPRESSION HIP SCREW Right 09/21/2015   Procedure: COMPRESSION HIP;  Surgeon: Vickki HearingStanley E Harrison, MD;  Location: [facility];  Service: Orthopedics;  Laterality: Right;    Allergies  Allergen Reactions  . Beef-Derived Products     [facility]    Outpatient Encounter Medications as of 10/03/2018  Medication Sig  . acetaminophen (TYLENOL) 325 MG tablet Take 2 tablets (650 mg total) by mouth every 6 (six) hours as needed for mild pain (or Fever >/= 101).  Marland Kitchen. aspirin 81 MG chewable tablet Chew 81 mg by mouth daily.  . calcium carbonate (OS-CAL - DOSED IN MG OF ELEMENTAL CALCIUM) 1250 (500 Ca) MG tablet Take 1 tablet by mouth 2 (two) times daily with a meal.  . Cholecalciferol 125 MCG (5000 UT) TABS Take 5,000 Units by mouth daily.   . memantine (NAMENDA) 5 MG tablet Take 5 mg by mouth 2 (two) times daily.  . mirtazapine (REMERON) 15 MG tablet Take 15 mg by mouth at bedtime.   . NON FORMULARY Diet Type: Upgrade patient to nectar thick liquids,  continue dysphagia 2 diet  . omeprazole (PRILOSEC) 20 MG capsule Take 20 mg by mouth daily.  Marland Kitchen. senna-docusate (SENOKOT-S) 8.6-50 MG tablet Take 1 tablet by mouth 2 (two) times daily.  . sertraline (ZOLOFT) 25 MG tablet Take 25 mg  by mouth every morning.   . triamcinolone (KENALOG) 0.025 % cream Apply 1 application topically 2 (two) times daily as needed (for itching/rash).    No facility-administered encounter medications on file as of 10/03/2018.      Review of Systems  Unable to perform ROS: Dementia    Immunization History  Administered Date(s) Administered  . Influenza-Unspecified 03/20/2016, 05/03/2017, 03/24/2018  . PPD Test 06/12/2015  . Pneumococcal Conjugate-13 05/03/2017  .  Pneumococcal-Unspecified 04/02/2016  . Tdap 03/04/2017   Pertinent  Health Maintenance Due  Topic Date Due  . INFLUENZA VACCINE  01/21/2019  . DEXA SCAN  Completed  . PNA vac Low Risk Adult  Completed   Fall Risk  03/21/2018 03/01/2017  Falls in the past year? No No   Functional Status Survey:    Vitals:   10/03/18 1417  BP: 138/88  Pulse: 100  Resp: (!) 22  Temp: 97.8 F (36.6 C)  TempSrc: Oral  Weight: 94 lb 9.6 oz (42.9 kg)  Height: 5\' 2"  (1.575 m)   Body mass index is 17.3 kg/m. Physical Exam Constitutional:      Appearance: She is well-developed.  HENT:     Head: Normocephalic.  Eyes:     Pupils: Pupils are equal, round, and reactive to light.  Neck:     Musculoskeletal: Neck supple.  Cardiovascular:     Rate and Rhythm: Normal rate and regular rhythm.     Heart sounds: No murmur.  Pulmonary:     Effort: Pulmonary effort is normal. No respiratory distress.     Breath sounds: Normal breath sounds. No stridor. No wheezing.  Musculoskeletal:     Comments: Mild edema  Lymphadenopathy:     Cervical: No cervical adenopathy.  Skin:    General: Skin is warm and dry.  Neurological:     Mental Status: She is alert.     Comments: Patient continues to be Aphasic. Is moving all her Extremities. Alert and respond to some commands not everytime. Seems at her Baseline  Psychiatric:        Behavior: Behavior normal.     Labs reviewed: Recent Labs    02/02/18 2131 02/02/18 2136 04/21/18 0705 06/10/18 0700  NA 143 144 138 140  K 3.5 3.6 3.7 3.9  CL 106 102 103 104  CO2 32  --  29 30  GLUCOSE 112* 106* 86 89  BUN 21 21 14 14   CREATININE 0.72 0.80 0.61 0.66  CALCIUM 9.4  --  9.1 9.1   Recent Labs    12/24/17 0415 02/02/18 2131 08/09/18 0700  AST 28 42* 26  ALT 26 35 23  ALKPHOS 53 60 54  BILITOT 0.4 0.7 0.5  PROT 5.9* 6.8 6.9  ALBUMIN 3.3* 3.9 3.9   Recent Labs    02/02/18 2131 02/02/18 2136 04/21/18 0705 06/10/18 0700  WBC 5.8  --  4.2 5.1   NEUTROABS 3.2  --  2.0 2.4  HGB 13.2 13.6 12.6 12.0  HCT 40.9 40.0 39.7 37.6  MCV 96.5  --  95.2 99.5  PLT 164  --  138* 166   Lab Results  Component Value Date   TSH 2.391 02/19/2017   Lab Results  Component Value Date   HGBA1C 5.2 02/03/2018   Lab Results  Component Value Date   CHOL 155 02/03/2018   HDL 61 02/03/2018   LDLCALC 89 02/03/2018   TRIG 27 02/03/2018   CHOLHDL 2.5 02/03/2018    Significant Diagnostic Results in last 30  days:  No results found.  Assessment/Plan Dementia with failure to thrive She is stable Off her Aricept On Namenda and Remeron and Zoloft MOST form in chart for daughter does not want Aggressive measures Osteoporosis On Vit D and Calcium Did not tolerate Fosamax Depression Continue on Zoloft and Remeron ? TIA in 08/19 Has been on aspirin Vit D insufficiency On Supplement    Family/ staff Communication:   Labs/tests ordered:    Total time spent in this patient care encounter was  25_  minutes; greater than 50% of the visit spent counseling patient and staff, reviewing records , Labs and coordinating care for problems addressed at this encounter.

## 2018-10-11 DIAGNOSIS — M6250 Muscle wasting and atrophy, not elsewhere classified, unspecified site: Secondary | ICD-10-CM | POA: Diagnosis not present

## 2018-10-11 DIAGNOSIS — M24542 Contracture, left hand: Secondary | ICD-10-CM | POA: Diagnosis not present

## 2018-10-11 DIAGNOSIS — R1312 Dysphagia, oropharyngeal phase: Secondary | ICD-10-CM | POA: Diagnosis not present

## 2018-10-11 DIAGNOSIS — R293 Abnormal posture: Secondary | ICD-10-CM | POA: Diagnosis not present

## 2018-10-11 DIAGNOSIS — F0390 Unspecified dementia without behavioral disturbance: Secondary | ICD-10-CM | POA: Diagnosis not present

## 2018-10-11 DIAGNOSIS — Z993 Dependence on wheelchair: Secondary | ICD-10-CM | POA: Diagnosis not present

## 2018-10-11 DIAGNOSIS — E43 Unspecified severe protein-calorie malnutrition: Secondary | ICD-10-CM | POA: Diagnosis not present

## 2018-10-11 IMAGING — MR MR HEAD W/O CM
9 of 10 series · 35 of 48 positions shown · non-contrast
Comparison: None.

CLINICAL DATA: Stroke follow-up, status post tPA administration.

EXAM:
MRI HEAD WITHOUT CONTRAST
TECHNIQUE: Multiplanar, multiecho pulse sequences of the brain and surrounding
structures were obtained without intravenous contrast.

[Series 3: DWI · axial · 3.0mm · 0.94mm/px · z∈[+16,+144]mm · 8 of 92 slices shown (1 of 2)]
[im 1/92]
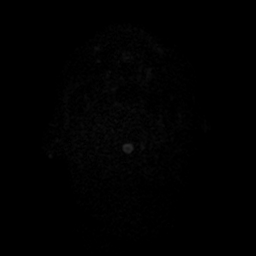
[im 11/92]
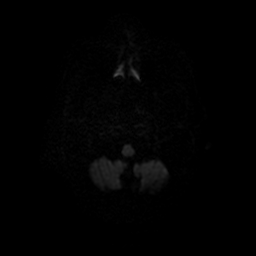
[im 31/92]
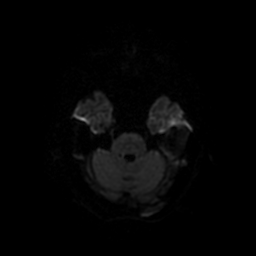
[im 41/92]
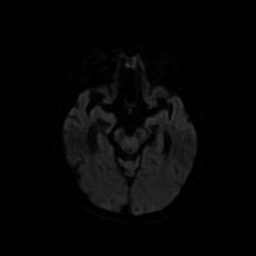
[im 51/92]
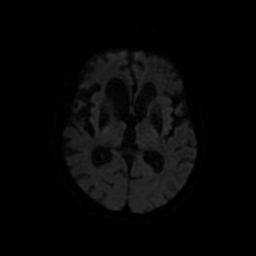
[im 61/92]
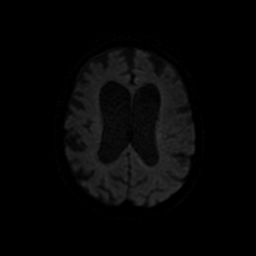
[im 81/92]
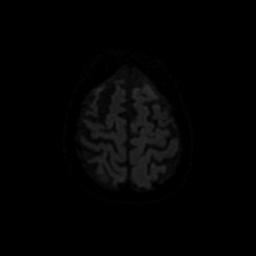
[im 92/92]
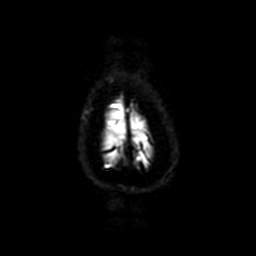

[Series 4: FLAIR · axial · 3.0mm · 0.47mm/px · z∈[+18,+143]mm · 2 of 23 slices shown (1 of 2)]
[im 1/23]
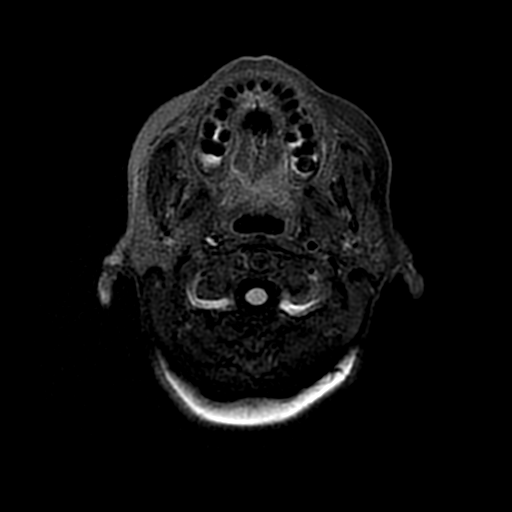
[im 23/23]
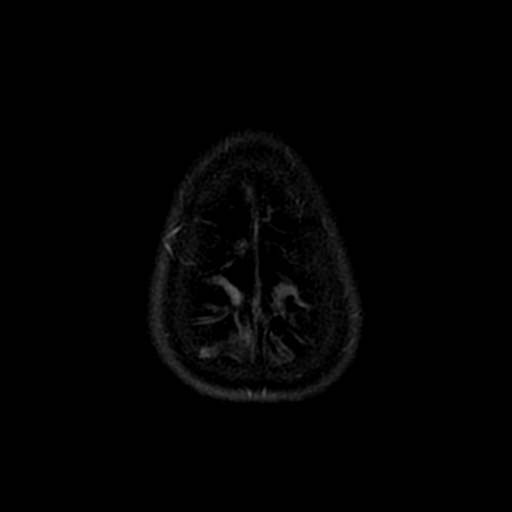

[Series 5: (person_name) · axial · 3.0mm · 0.47mm/px · z∈[+16,+48]mm · 3 of 92 slices shown]
[im 1/92]
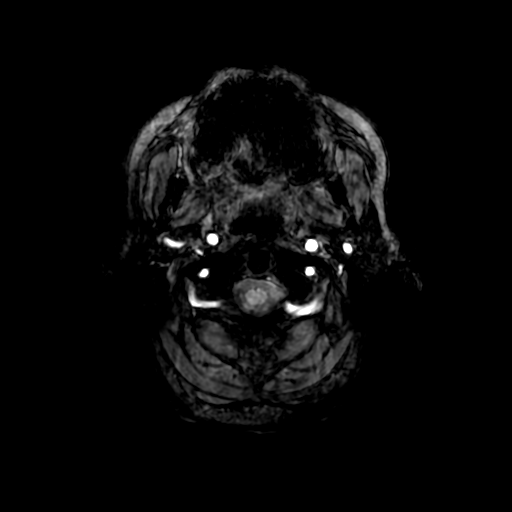
[im 12/92]
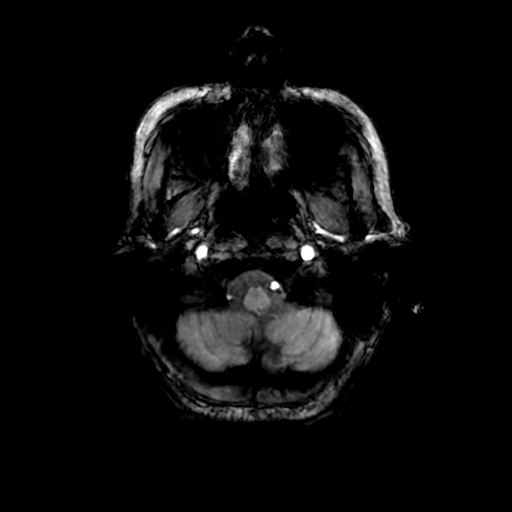
[im 23/92]
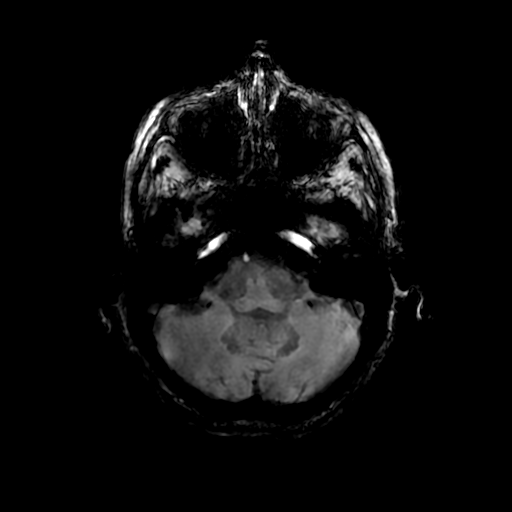

[Series 7: T2 · axial · 5.0mm · 0.47mm/px · z∈[+18,+143]mm · 2 of 23 slices shown (1 of 2)]
[im 1/23]
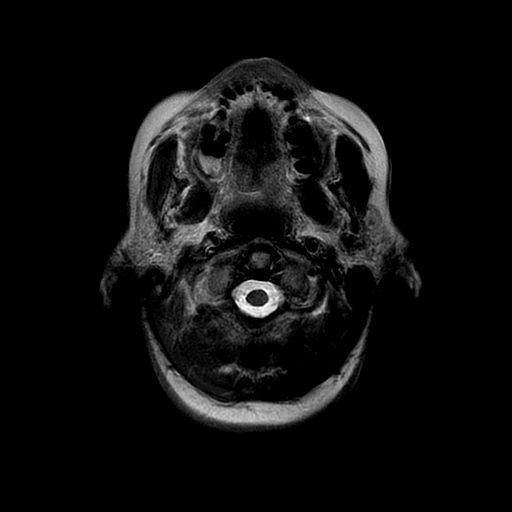
[im 23/23]
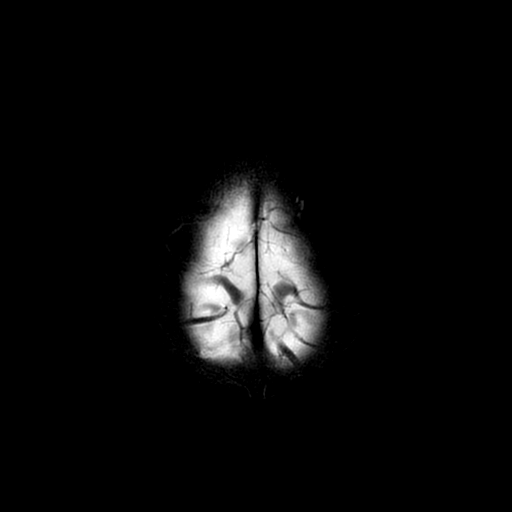

[Series 8: DWI · coronal · 4.0mm · 0.94mm/px · 7 of 64 slices shown (2 of 2)]
[im 1/64]
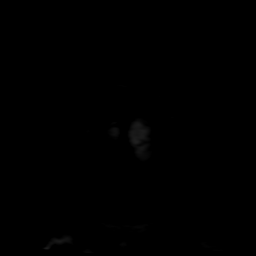
[im 11/64]
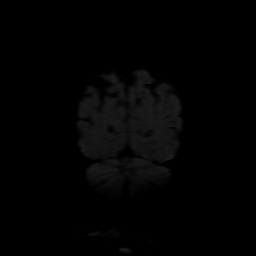
[im 22/64]
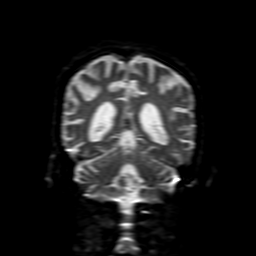
[im 32/64]
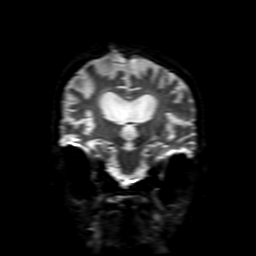
[im 43/64]
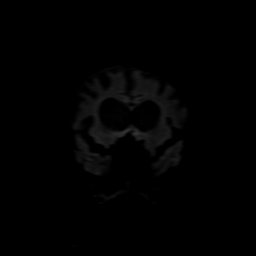
[im 53/64]
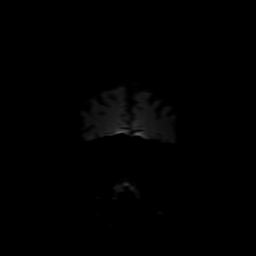
[im 64/64]
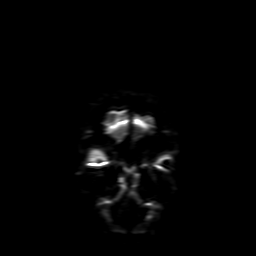

[Series 9: FLAIR · sagittal · 5.0mm · 0.47mm/px · 2 of 23 slices shown (2 of 2)]
[im 1/23]
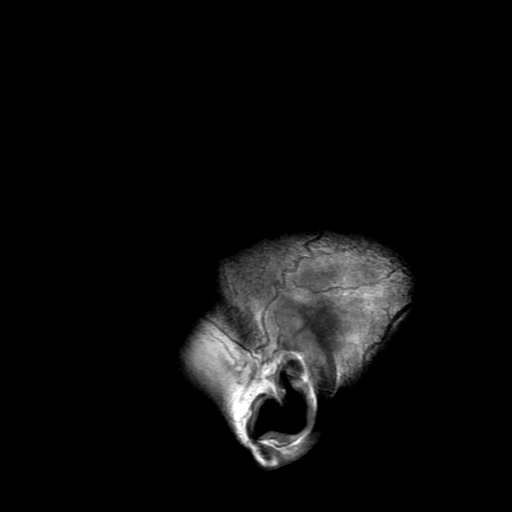
[im 23/23]
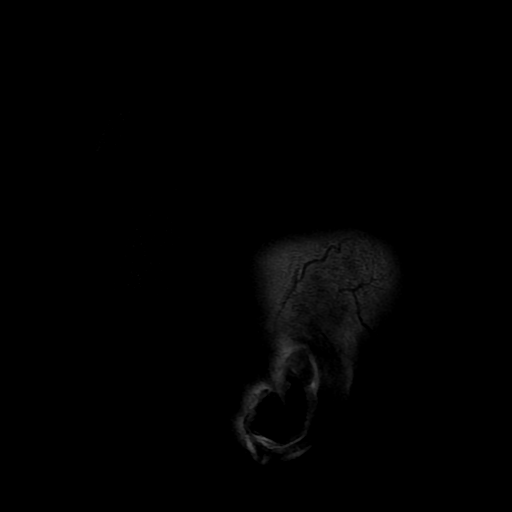

[Series 11: T2 · coronal · 5.0mm · 0.43mm/px · 3 of 27 slices shown (2 of 2)]
[im 1/27]
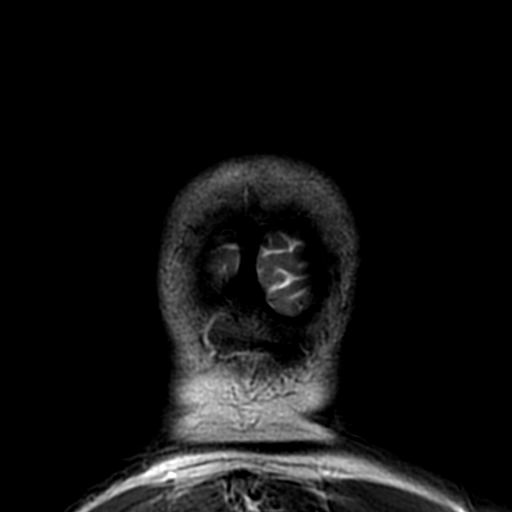
[im 14/27]
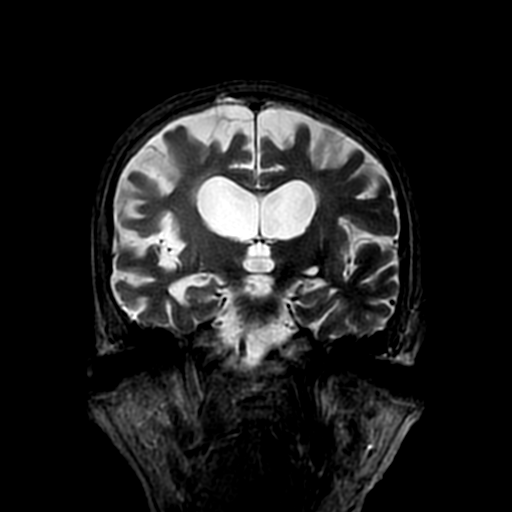
[im 27/27]
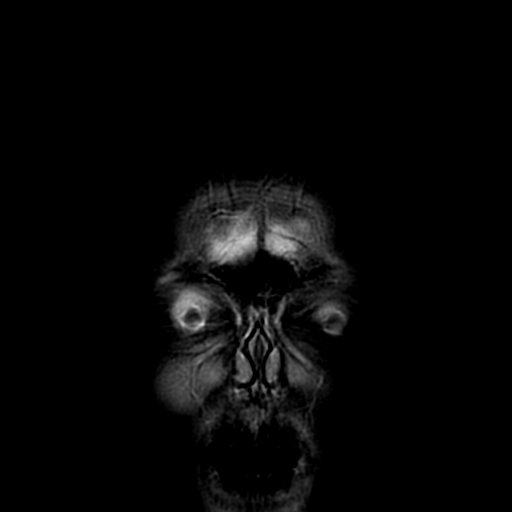

[Series 350: ADC · axial · 3.0mm · 0.94mm/px · z∈[+16,+144]mm · 5 of 46 slices shown (1 of 2)]
[im 1/46]
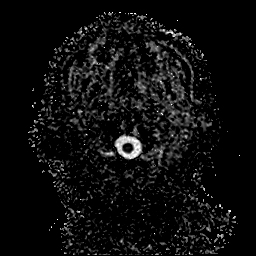
[im 12/46]
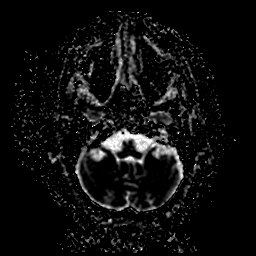
[im 23/46]
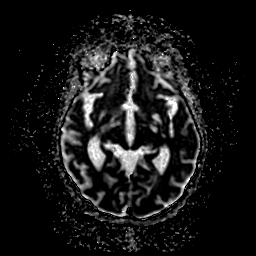
[im 34/46]
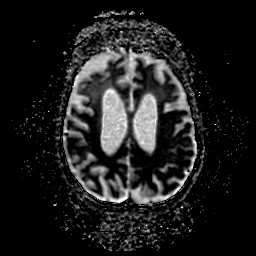
[im 46/46]
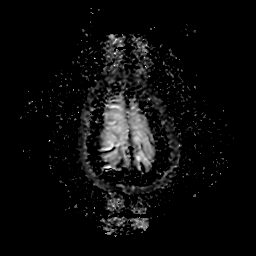

[Series 850: ADC · coronal · 4.0mm · 0.94mm/px · 3 of 32 slices shown (2 of 2)]
[im 1/32]
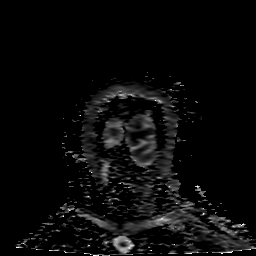
[im 16/32]
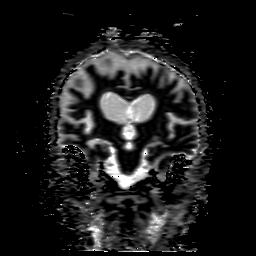
[im 32/32]
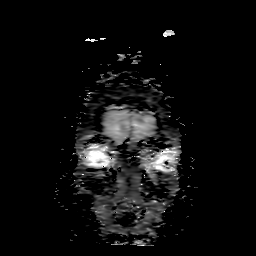

[35 of 48 positions shown; findings below may reference images not displayed]

FINDINGS: BRAIN: There is no acute infarct, acute hemorrhage or mass effect.
The midline structures are normal. There are no old infarcts.
Diffuse confluent hyperintense T2-weighted signal within the
periventricular, deep and juxtacortical white matter, most commonly
due to chronic ischemic microangiopathy. Advanced brain parenchymal
atrophy. Multiple enlarged perivascular spaces of the lentiform
nuclei. Susceptibility-sensitive sequences show no chronic
microhemorrhage or superficial siderosis.

VASCULAR: Major intracranial arterial and venous sinus flow voids
are preserved.

SKULL AND UPPER CERVICAL SPINE: The visualized skull base,
calvarium, upper cervical spine and extracranial soft tissues are
normal.

SINUSES/ORBITS: No fluid levels or advanced mucosal thickening. No
mastoid or middle ear effusion. The orbits are normal.
IMPRESSION: 1. No acute intracranial abnormality.
2. Severe brain volume loss and chronic microvascular disease.

## 2018-10-12 DIAGNOSIS — M6250 Muscle wasting and atrophy, not elsewhere classified, unspecified site: Secondary | ICD-10-CM | POA: Diagnosis not present

## 2018-10-12 DIAGNOSIS — F0390 Unspecified dementia without behavioral disturbance: Secondary | ICD-10-CM | POA: Diagnosis not present

## 2018-10-12 DIAGNOSIS — M24542 Contracture, left hand: Secondary | ICD-10-CM | POA: Diagnosis not present

## 2018-10-12 DIAGNOSIS — R1312 Dysphagia, oropharyngeal phase: Secondary | ICD-10-CM | POA: Diagnosis not present

## 2018-10-12 DIAGNOSIS — E43 Unspecified severe protein-calorie malnutrition: Secondary | ICD-10-CM | POA: Diagnosis not present

## 2018-10-12 DIAGNOSIS — R293 Abnormal posture: Secondary | ICD-10-CM | POA: Diagnosis not present

## 2018-10-13 DIAGNOSIS — F0390 Unspecified dementia without behavioral disturbance: Secondary | ICD-10-CM | POA: Diagnosis not present

## 2018-10-13 DIAGNOSIS — R293 Abnormal posture: Secondary | ICD-10-CM | POA: Diagnosis not present

## 2018-10-13 DIAGNOSIS — R1312 Dysphagia, oropharyngeal phase: Secondary | ICD-10-CM | POA: Diagnosis not present

## 2018-10-13 DIAGNOSIS — M6250 Muscle wasting and atrophy, not elsewhere classified, unspecified site: Secondary | ICD-10-CM | POA: Diagnosis not present

## 2018-10-13 DIAGNOSIS — M24542 Contracture, left hand: Secondary | ICD-10-CM | POA: Diagnosis not present

## 2018-10-13 DIAGNOSIS — E43 Unspecified severe protein-calorie malnutrition: Secondary | ICD-10-CM | POA: Diagnosis not present

## 2018-10-14 DIAGNOSIS — M24542 Contracture, left hand: Secondary | ICD-10-CM | POA: Diagnosis not present

## 2018-10-14 DIAGNOSIS — E43 Unspecified severe protein-calorie malnutrition: Secondary | ICD-10-CM | POA: Diagnosis not present

## 2018-10-14 DIAGNOSIS — F0390 Unspecified dementia without behavioral disturbance: Secondary | ICD-10-CM | POA: Diagnosis not present

## 2018-10-14 DIAGNOSIS — R293 Abnormal posture: Secondary | ICD-10-CM | POA: Diagnosis not present

## 2018-10-14 DIAGNOSIS — M6250 Muscle wasting and atrophy, not elsewhere classified, unspecified site: Secondary | ICD-10-CM | POA: Diagnosis not present

## 2018-10-14 DIAGNOSIS — R1312 Dysphagia, oropharyngeal phase: Secondary | ICD-10-CM | POA: Diagnosis not present

## 2018-10-17 DIAGNOSIS — E43 Unspecified severe protein-calorie malnutrition: Secondary | ICD-10-CM | POA: Diagnosis not present

## 2018-10-17 DIAGNOSIS — R293 Abnormal posture: Secondary | ICD-10-CM | POA: Diagnosis not present

## 2018-10-17 DIAGNOSIS — M6250 Muscle wasting and atrophy, not elsewhere classified, unspecified site: Secondary | ICD-10-CM | POA: Diagnosis not present

## 2018-10-17 DIAGNOSIS — R1312 Dysphagia, oropharyngeal phase: Secondary | ICD-10-CM | POA: Diagnosis not present

## 2018-10-17 DIAGNOSIS — F0390 Unspecified dementia without behavioral disturbance: Secondary | ICD-10-CM | POA: Diagnosis not present

## 2018-10-17 DIAGNOSIS — M24542 Contracture, left hand: Secondary | ICD-10-CM | POA: Diagnosis not present

## 2018-10-18 DIAGNOSIS — E43 Unspecified severe protein-calorie malnutrition: Secondary | ICD-10-CM | POA: Diagnosis not present

## 2018-10-18 DIAGNOSIS — F0390 Unspecified dementia without behavioral disturbance: Secondary | ICD-10-CM | POA: Diagnosis not present

## 2018-10-18 DIAGNOSIS — M6250 Muscle wasting and atrophy, not elsewhere classified, unspecified site: Secondary | ICD-10-CM | POA: Diagnosis not present

## 2018-10-18 DIAGNOSIS — R293 Abnormal posture: Secondary | ICD-10-CM | POA: Diagnosis not present

## 2018-10-18 DIAGNOSIS — R1312 Dysphagia, oropharyngeal phase: Secondary | ICD-10-CM | POA: Diagnosis not present

## 2018-10-18 DIAGNOSIS — M24542 Contracture, left hand: Secondary | ICD-10-CM | POA: Diagnosis not present

## 2018-10-19 ENCOUNTER — Encounter: Payer: Self-pay | Admitting: Adult Health

## 2018-10-19 ENCOUNTER — Non-Acute Institutional Stay (SKILLED_NURSING_FACILITY): Payer: Medicare Other | Admitting: Adult Health

## 2018-10-19 DIAGNOSIS — E43 Unspecified severe protein-calorie malnutrition: Secondary | ICD-10-CM | POA: Diagnosis not present

## 2018-10-19 DIAGNOSIS — M6250 Muscle wasting and atrophy, not elsewhere classified, unspecified site: Secondary | ICD-10-CM | POA: Diagnosis not present

## 2018-10-19 DIAGNOSIS — F0150 Vascular dementia without behavioral disturbance: Secondary | ICD-10-CM | POA: Diagnosis not present

## 2018-10-19 DIAGNOSIS — R627 Adult failure to thrive: Secondary | ICD-10-CM

## 2018-10-19 DIAGNOSIS — R1312 Dysphagia, oropharyngeal phase: Secondary | ICD-10-CM | POA: Diagnosis not present

## 2018-10-19 DIAGNOSIS — R293 Abnormal posture: Secondary | ICD-10-CM | POA: Diagnosis not present

## 2018-10-19 DIAGNOSIS — M24542 Contracture, left hand: Secondary | ICD-10-CM | POA: Diagnosis not present

## 2018-10-19 DIAGNOSIS — F0390 Unspecified dementia without behavioral disturbance: Secondary | ICD-10-CM | POA: Diagnosis not present

## 2018-10-19 NOTE — Progress Notes (Signed)
Location:   [facility] Room Number: 148 D Place of Service:  SNF (31)   CODE STATUS: DNR  Allergies  Allergen Reactions  . Beef-Derived Products     [facility]    Chief Complaint  Patient presents with  . Acute Visit     Care Plan Meeting / change in status    HPI:  We have come together for her care plan meeting. She has been experiencing a slow; progressive decline in her status. She is losing weight from 100 pounds in March to 89 pounds. She did have regular family visits on the weekends. Staff report that she is not taking her pills that her po intake is somewhat poor. There are no reports of uncontrolled pain; no reports of fevers present.    Past Medical History:  Diagnosis Date  . Acute encephalopathy 2012   In context of diarrhea and clinical dehydration  . Dementia (HCC)    Noncompliant with Aricept as per daughter  . Protein-calorie malnutrition, severe (HCC)   . Stroke (HCC)   . Underweight   . Vertigo     Past Surgical History:  Procedure Laterality Date  . COMPRESSION HIP SCREW Right 09/21/2015   Procedure: COMPRESSION HIP;  Surgeon: Vickki HearingStanley E Harrison, MD;  Location: [facility];  Service: Orthopedics;  Laterality: Right;    Social History   Socioeconomic History  . Marital status: Widowed    Spouse name: Not on file  . Number of children: Not on file  . Years of education: Not on file  . Highest education level: Not on file  Occupational History  . Not on file  Social Needs  . Financial resource strain: Not on file  . Food insecurity:    Worry: Not on file    Inability: Not on file  . Transportation needs:    Medical: Not on file    Non-medical: Not on file  Tobacco Use  . Smoking status: Never Smoker  . Smokeless tobacco: Never Used  Substance and Sexual Activity  . Alcohol use: Yes    Comment: occasionally  . Drug use: No  . Sexual activity: Never  Lifestyle  . Physical activity:    Days  per week: Not on file    Minutes per session: Not on file  . Stress: Not on file  Relationships  . Social connections:    Talks on phone: Not on file    Gets together: Not on file    Attends religious service: Not on file    Active member of club or organization: Not on file    Attends meetings of clubs or organizations: Not on file    Relationship status: Not on file  . Intimate partner violence:    Fear of current or ex partner: Not on file    Emotionally abused: Not on file    Physically abused: Not on file    Forced sexual activity: Not on file  Other Topics Concern  . Not on file  Social History Narrative  . Not on file   Family History  Problem Relation Age of Onset  . Dementia Mother   . Cancer Paternal Grandmother        of upper extremity  . Heart disease Neg Hx   . Diabetes Neg Hx   . Stroke Neg Hx       VITAL SIGNS BP (!) 127/57   Pulse (!) 102   Temp 98.4 F (36.9 C)  Resp 20   Ht 5\' 2"  (1.575 m)   Wt 89 lb (40.4 kg)   BMI 16.28 kg/m   Outpatient Encounter Medications as of 10/19/2018  Medication Sig  . acetaminophen (TYLENOL) 325 MG tablet Take 2 tablets (650 mg total) by mouth every 6 (six) hours as needed for mild pain (or Fever >/= 101).  Marland Kitchen aspirin 81 MG chewable tablet Chew 81 mg by mouth daily.  . calcium carbonate (OS-CAL - DOSED IN MG OF ELEMENTAL CALCIUM) 1250 (500 Ca) MG tablet Take 1 tablet by mouth 2 (two) times daily with a meal.  . Cholecalciferol 125 MCG (5000 UT) TABS Take 5,000 Units by mouth daily.   . memantine (NAMENDA) 5 MG tablet Take 5 mg by mouth 2 (two) times daily.  . mirtazapine (REMERON) 15 MG tablet Take 15 mg by mouth at bedtime.   . NON FORMULARY Diet Type: Upgrade patient to nectar thick liquids,  continue dysphagia 2 diet  . omeprazole (PRILOSEC) 20 MG capsule Take 20 mg by mouth daily.  Marland Kitchen senna-docusate (SENOKOT-S) 8.6-50 MG tablet Take 1 tablet by mouth 2 (two) times daily.  . sertraline (ZOLOFT) 25 MG tablet Take  25 mg by mouth every morning.   . triamcinolone (KENALOG) 0.025 % cream Apply 1 application topically 2 (two) times daily as needed (for itching/rash).    No facility-administered encounter medications on file as of 10/19/2018.      SIGNIFICANT DIAGNOSTIC EXAMS  LABS REVIEWED PREVIOUS:   04-21-18: wbc 4.2; hgb 12.6; hct 39.7; mcv 95.2; plt 138; glucose 86; bun 14; creat 0.61; k+ 3.7; na++ 138; ca 9.1 vit D 25.8 06-10-18: wbc 5.1; hgb 12.0; hct 37.6; mcv 99.5; plt 166 glucose 89; bun 14; creat 0.66; k+ 3.9; na++ 140; ca 9.1.  08-09-18: liver normal albumin 3.9  NO NEW LABS.   Review of Systems  Unable to perform ROS: Dementia (unable to participate )    Physical Exam Constitutional:      General: She is not in acute distress.    Appearance: She is underweight. She is not diaphoretic.  Neck:     Musculoskeletal: Neck supple.     Thyroid: No thyromegaly.  Cardiovascular:     Rate and Rhythm: Normal rate and regular rhythm.     Pulses: Normal pulses.     Heart sounds: Normal heart sounds.  Pulmonary:     Effort: Pulmonary effort is normal. No respiratory distress.     Breath sounds: Normal breath sounds.  Abdominal:     General: Bowel sounds are normal. There is no distension.     Palpations: Abdomen is soft.     Tenderness: There is no abdominal tenderness.  Musculoskeletal:     Right lower leg: No edema.     Left lower leg: No edema.     Comments:  Is able to move all extremities Has left hand tremor and contracture  Mild kyphosis History of right hip compression screw 2017   Lymphadenopathy:     Cervical: No cervical adenopathy.  Skin:    General: Skin is warm and dry.  Neurological:     Mental Status: She is alert. Mental status is at baseline.  Psychiatric:        Mood and Affect: Mood normal.      ASSESSMENT/ PLAN:  TODAY:   1. Vascular dementia without behavioral disturbance 2. Failure to thrive in adult  Weight loss is an expected but unfortunate  outcome at the late stage of dementia Will  stop calcium and vit D to help reduce pill load Will change namenda to 10 mg in the AM and 5 mg in the PM Will lower remeron to 7.5 mg nightly   Will get cmp and pre-albumin; cbc        MD is aware of resident's narcotic use and is in agreement with current plan of care. We will attempt to wean resident as apropriate   Synthia Innocent NP [facility]  Contact [phone] Monday through Friday 8am- 5pm  After hours call [phone]

## 2018-10-20 ENCOUNTER — Encounter (HOSPITAL_COMMUNITY)
Admission: RE | Admit: 2018-10-20 | Discharge: 2018-10-20 | Disposition: A | Discharge status: Home / Self Care | Payer: Medicare Other | Source: Skilled Nursing Facility | Attending: Adult Health | Admitting: Adult Health

## 2018-10-20 ENCOUNTER — Non-Acute Institutional Stay (SKILLED_NURSING_FACILITY): Payer: Medicare Other | Admitting: Internal Medicine

## 2018-10-20 ENCOUNTER — Encounter: Payer: Self-pay | Admitting: Internal Medicine

## 2018-10-20 DIAGNOSIS — M24542 Contracture, left hand: Secondary | ICD-10-CM | POA: Diagnosis not present

## 2018-10-20 DIAGNOSIS — Z993 Dependence on wheelchair: Secondary | ICD-10-CM | POA: Diagnosis not present

## 2018-10-20 DIAGNOSIS — R627 Adult failure to thrive: Secondary | ICD-10-CM

## 2018-10-20 DIAGNOSIS — M6250 Muscle wasting and atrophy, not elsewhere classified, unspecified site: Secondary | ICD-10-CM | POA: Diagnosis not present

## 2018-10-20 DIAGNOSIS — R1312 Dysphagia, oropharyngeal phase: Secondary | ICD-10-CM | POA: Diagnosis not present

## 2018-10-20 DIAGNOSIS — E43 Unspecified severe protein-calorie malnutrition: Secondary | ICD-10-CM | POA: Diagnosis not present

## 2018-10-20 DIAGNOSIS — F0150 Vascular dementia without behavioral disturbance: Secondary | ICD-10-CM | POA: Diagnosis not present

## 2018-10-20 DIAGNOSIS — F0390 Unspecified dementia without behavioral disturbance: Secondary | ICD-10-CM | POA: Insufficient documentation

## 2018-10-20 DIAGNOSIS — F339 Major depressive disorder, recurrent, unspecified: Secondary | ICD-10-CM

## 2018-10-20 DIAGNOSIS — R293 Abnormal posture: Secondary | ICD-10-CM | POA: Diagnosis not present

## 2018-10-20 DIAGNOSIS — F329 Major depressive disorder, single episode, unspecified: Secondary | ICD-10-CM | POA: Insufficient documentation

## 2018-10-20 LAB — CBC WITH DIFFERENTIAL/PLATELET
Abs Immature Granulocytes: 0.02 10*3/uL (ref 0.00–0.07)
Basophils Absolute: 0 10*3/uL (ref 0.0–0.1)
Basophils Relative: 0 %
Eosinophils Absolute: 0 10*3/uL (ref 0.0–0.5)
Eosinophils Relative: 0 %
HCT: 37.6 % (ref 36.0–46.0)
Hemoglobin: 11.6 g/dL — ABNORMAL LOW (ref 12.0–15.0)
Immature Granulocytes: 0 %
Lymphocytes Relative: 8 %
Lymphs Abs: 0.6 10*3/uL — ABNORMAL LOW (ref 0.7–4.0)
MCH: 30.4 pg (ref 26.0–34.0)
MCHC: 30.9 g/dL (ref 30.0–36.0)
MCV: 98.7 fL (ref 80.0–100.0)
Monocytes Absolute: 1.3 10*3/uL — ABNORMAL HIGH (ref 0.1–1.0)
Monocytes Relative: 17 %
Neutro Abs: 5.9 10*3/uL (ref 1.7–7.7)
Neutrophils Relative %: 75 %
Platelets: 177 10*3/uL (ref 150–400)
RBC: 3.81 MIL/uL — ABNORMAL LOW (ref 3.87–5.11)
RDW: 12.1 % (ref 11.5–15.5)
WBC: 7.9 10*3/uL (ref 4.0–10.5)
nRBC: 0 % (ref 0.0–0.2)

## 2018-10-20 LAB — COMPREHENSIVE METABOLIC PANEL
ALT: 35 U/L (ref 0–44)
AST: 37 U/L (ref 15–41)
Albumin: 2.8 g/dL — ABNORMAL LOW (ref 3.5–5.0)
Alkaline Phosphatase: 65 U/L (ref 38–126)
Anion gap: 6 (ref 5–15)
BUN: 15 mg/dL (ref 8–23)
CO2: 31 mmol/L (ref 22–32)
Calcium: 8.6 mg/dL — ABNORMAL LOW (ref 8.9–10.3)
Chloride: 106 mmol/L (ref 98–111)
Creatinine, Ser: 0.44 mg/dL (ref 0.44–1.00)
GFR calc Af Amer: >60 mL/min (ref >60)
GFR calc non Af Amer: >60 mL/min (ref >60)
Glucose, Bld: 135 mg/dL — ABNORMAL HIGH (ref 70–99)
Potassium: 3 mmol/L — ABNORMAL LOW (ref 3.5–5.1)
Sodium: 143 mmol/L (ref 135–145)
Total Bilirubin: 0.6 mg/dL (ref 0.3–1.2)
Total Protein: 6.3 g/dL — ABNORMAL LOW (ref 6.5–8.1)

## 2018-10-20 LAB — PREALBUMIN: Prealbumin: <5 mg/dL — ABNORMAL LOW (ref 18–38)

## 2018-10-20 MED ORDER — LORAZEPAM 0.5 MG PO TABS: 0.5000 mg | ORAL_TABLET | ORAL | 1 refills | Status: DC | PRN

## 2018-10-20 NOTE — Progress Notes (Signed)
Location:  [facility] Room Number: 148 D Place of Service:  SNF [phone]) Provider:  Einar Crow, MD  Mahlon Gammon, MD  Patient Care Team: Mahlon Gammon, MD as PCP - General (Internal Medicine) Synetta Shadow as Physician Assistant (Internal Medicine)  Extended Emergency Contact Information Primary Emergency Contact: [facility] Address: [address] [place], [place] [address] [place] Home Phone: [phone] Mobile Phone: [phone] Relation: Daughter Secondary Emergency Contact: Gena Fray [place] Mobile Phone: [phone] Relation: Son  Code Status:  DNR Goals of care: Advanced Directive information Advanced Directives 10/20/2018  Does Patient Have a Medical Advance Directive? Yes  Type of Advance Directive Out of facility DNR (pink MOST or yellow form)  Does patient want to make changes to medical advance directive? No - Patient declined  Copy of Healthcare Power of Attorney in Chart? -  Would patient like information on creating a medical advance directive? No - Patient declined  Pre-existing out of facility DNR order (yellow form or pink MOST form) Yellow form placed in chart (order not valid for inpatient use);Pink MOST form placed in chart (order not valid for inpatient use)     Chief Complaint  Patient presents with  . Acute Visit    Fever    HPI:  Pt is a 78 y.o. female seen today for an acute visit for fever and inability to [facility]  She has h/o Right Femoral Fracture due to fall, Depression and dementia., Osteoporosisand Hypernatremia with Failure to thrive.Vit D Insufficiency  Patient recently was noticed not been able to swallow. Her nurse said she is holding food in her mouth.Last night she spike low grade temp of 100.6 She is refusing to take her Meds. Was seen by the Speech therapy today and they think she is not safe to give anything orally as she is at high risk  of aspiration. Patient was started on IV fluids yesterday.  She is alert and responsive.  Is not following any commands but nods her head. Did not have any temp this morning.  No cough or shortness of breath.     Past Medical History:  Diagnosis Date  . Acute encephalopathy 2012   In context of diarrhea and clinical dehydration  . Dementia (HCC)    Noncompliant with Aricept as per daughter  . Protein-calorie malnutrition, severe (HCC)   . Stroke (HCC)   . Underweight   . Vertigo    Past Surgical History:  Procedure Laterality Date  . COMPRESSION HIP SCREW Right 09/21/2015   Procedure: COMPRESSION HIP;  Surgeon: Vickki Hearing, MD;  Location: [facility];  Service: Orthopedics;  Laterality: Right;    Allergies  Allergen Reactions  . Beef-Derived Products     Unknown reaction-Listed on [facility]    Outpatient Encounter Medications as of 10/20/2018  Medication Sig  . acetaminophen (TYLENOL) 325 MG tablet Take 2 tablets (650 mg total) by mouth every 6 (six) hours as needed for mild pain (or Fever >/= 101).  Marland Kitchen aspirin 81 MG chewable tablet Chew 81 mg by mouth daily.  . memantine (NAMENDA) 5 MG tablet Take 5 mg by mouth daily. Ending on 10/19/18 then begin 10 mg by mouth daily on 10/20/18  . mirtazapine (REMERON) 7.5 MG tablet Take 7.5 mg by mouth at bedtime.   . NON FORMULARY Diet Type: Upgrade patient to nectar thick liquids,  continue dysphagia 2 diet  .  omeprazole (PRILOSEC) 20 MG capsule Take 20 mg by mouth daily.  Marland Kitchen senna-docusate (SENOKOT-S) 8.6-50 MG tablet Take 1 tablet by mouth 2 (two) times daily.  . sertraline (ZOLOFT) 25 MG tablet Take 25 mg by mouth every morning.   Marland Kitchen SODIUM CHLORIDE IV D5% - 0.45% sodium chloride Give 2 ml intravenously every shift: rate 75 ml / hr x 2L  . triamcinolone (KENALOG) 0.025 % cream Apply 1 application topically 2 (two) times daily as needed (for itching/rash).   . [DISCONTINUED] calcium carbonate (OS-CAL - DOSED IN MG OF ELEMENTAL CALCIUM) 1250 (500  Ca) MG tablet Take 1 tablet by mouth 2 (two) times daily with a meal.  . [DISCONTINUED] Cholecalciferol 125 MCG (5000 UT) TABS Take 5,000 Units by mouth daily.    No facility-administered encounter medications on file as of 10/20/2018.     Review of Systems  Unable to perform ROS: Dementia    Immunization History  Administered Date(s) Administered  . Influenza-Unspecified 03/20/2016, 05/03/2017, 03/24/2018  . PPD Test 06/12/2015  . Pneumococcal Conjugate-13 05/03/2017  . Pneumococcal-Unspecified 04/02/2016  . Tdap 03/04/2017   Pertinent  Health Maintenance Due  Topic Date Due  . INFLUENZA VACCINE  01/21/2019  . DEXA SCAN  Completed  . PNA vac Low Risk Adult  Completed   Fall Risk  03/21/2018 03/01/2017  Falls in the past year? No No   Functional Status Survey:    Vitals:   10/20/18 1136  BP: 112/69  Pulse: 80  Resp: 20  Temp: 98.1 F (36.7 C)  Weight: 89 lb (40.4 kg)  Height:  (1.575 m)   Body mass index is 16.28 kg/m. Physical Exam Vitals signs reviewed.  Constitutional:      Appearance: Normal appearance.  HENT:     Head: Normocephalic.     Nose: Nose normal.     Mouth/Throat:     Mouth: Mucous membranes are dry.  Eyes:     Pupils: Pupils are equal, round, and reactive to light.  Neck:     Musculoskeletal: Neck supple.  Cardiovascular:     Rate and Rhythm: Normal rate and regular rhythm.     Pulses: Normal pulses.     Heart sounds: Normal heart sounds.  Pulmonary:     Effort: Pulmonary effort is normal.     Breath sounds: Normal breath sounds.  Abdominal:     General: Abdomen is flat. Bowel sounds are normal.     Palpations: Abdomen is soft.  Musculoskeletal:        General: No swelling.  Skin:    General: Skin is warm and dry.  Neurological:     General: No focal deficit present.     Mental Status: She is alert.     Comments: Not vocal Follows me with her eyes. Does nto respond or follow commands     Labs reviewed: Recent Labs     04/21/18 0705 06/10/18 0700 10/20/18 0600  NA 138 140 143  K 3.7 3.9 3.0*  CL 103 104 106  CO2 GLUCOSE 86 89 135*  BUN CREATININE 0.61 0.66 0.44  CALCIUM 9.1 9.1 8.6*   Recent Labs    02/02/18 2131 08/09/18 0700 10/20/18 0600  AST 42* 26 37  ALT 35 23 35  ALKPHOS 60 54 65  BILITOT 0.7 0.5 0.6  PROT 6.8 6.9 6.3*  ALBUMIN 3.9 3.9 2.8*   Recent Labs    04/21/18 0705 06/10/18 0700 10/20/18 0600  WBC 4.2 5.1 7.9  NEUTROABS 2.0 2.4 5.9  HGB 12.6 12.0 11.6*  HCT 39.7 37.6 37.6  MCV 95.2 99.5 98.7  PLT 138* 166 177   Lab Results  Component Value Date   TSH 2.391 02/19/2017   Lab Results  Component Value Date   HGBA1C 5.2 02/03/2018   Lab Results  Component Value Date   CHOL 155 02/03/2018   HDL 61 02/03/2018   LDLCALC 89 02/03/2018   TRIG 27 02/03/2018   CHOLHDL 2.5 02/03/2018    Significant Diagnostic Results in last 30 days:  No results found.  Assessment/Plan Patient with End stage dementia and Now she has severe Oral dysphagia Discussed with Speech therapy and her Daughter Will discontinue IV fluids Her labs do not show anything acute. Her Prealbumin is <5 Discontinue all her Meds Will change her to Comfort care Start her on Ativan Prn  Per speech she os not candidate for anything by mouth Will keep her on Puree and honey thick diet under supervision for comfort Patient refusing meds MOST form was revised to Comfort Care   Family/ staff Communication: Daughter  Labs/tests ordered:   Total time spent in this patient care encounter was  45_  minutes; greater than 50% of the visit spent counseling daughter and staff, reviewing records , Labs and coordinating care for problems addressed at this encounter.

## 2018-10-26 ENCOUNTER — Encounter: Payer: Self-pay | Admitting: Adult Health

## 2018-10-26 ENCOUNTER — Non-Acute Institutional Stay (SKILLED_NURSING_FACILITY): Payer: Medicare Other | Admitting: Adult Health

## 2018-10-26 ENCOUNTER — Other Ambulatory Visit: Payer: Self-pay | Admitting: Adult Health

## 2018-10-26 DIAGNOSIS — F0150 Vascular dementia without behavioral disturbance: Secondary | ICD-10-CM

## 2018-10-26 DIAGNOSIS — R627 Adult failure to thrive: Secondary | ICD-10-CM

## 2018-10-26 NOTE — Progress Notes (Signed)
Location:    [facility] Room Number: 151/P Place of Service:  SNF (31)   CODE STATUS: DNR  Allergies  Allergen Reactions  . Beef-Derived Products     [facility]    Chief Complaint  Patient presents with  . Acute Visit    Care Plan Meeting    HPI:  We have come together for routine care plan meeting. Her daughter is present via phone. She is at end of life. She is not verbally responsive today. She has very little po intake. There are no indications of pain or distress or restlessness. There are no reports of fevers. She does appear to resting quietly. Her family desires for comfort only.   Past Medical History:  Diagnosis Date  . Acute encephalopathy 2012   In context of diarrhea and clinical dehydration  . Dementia (HCC)    Noncompliant with Aricept as per daughter  . Protein-calorie malnutrition, severe (HCC)   . Stroke (HCC)   . Underweight   . Vertigo     Past Surgical History:  Procedure Laterality Date  . COMPRESSION HIP SCREW Right 09/21/2015   Procedure: COMPRESSION HIP;  Surgeon: Vickki HearingStanley E Harrison, MD;  Location: [facility];  Service: Orthopedics;  Laterality: Right;    Social History   Socioeconomic History  . Marital status: Widowed    Spouse name: Not on file  . Number of children: Not on file  . Years of education: Not on file  . Highest education level: Not on file  Occupational History  . Not on file  Social Needs  . Financial resource strain: Not on file  . Food insecurity:    Worry: Not on file    Inability: Not on file  . Transportation needs:    Medical: Not on file    Non-medical: Not on file  Tobacco Use  . Smoking status: Never Smoker  . Smokeless tobacco: Never Used  Substance and Sexual Activity  . Alcohol use: Yes    Comment: occasionally  . Drug use: No  . Sexual activity: Never  Lifestyle  . Physical activity:    Days per week: Not on file    Minutes per session: Not on file  .  Stress: Not on file  Relationships  . Social connections:    Talks on phone: Not on file    Gets together: Not on file    Attends religious service: Not on file    Active member of club or organization: Not on file    Attends meetings of clubs or organizations: Not on file    Relationship status: Not on file  . Intimate partner violence:    Fear of current or ex partner: Not on file    Emotionally abused: Not on file    Physically abused: Not on file    Forced sexual activity: Not on file  Other Topics Concern  . Not on file  Social History Narrative  . Not on file   Family History  Problem Relation Age of Onset  . Dementia Mother   . Cancer Paternal Grandmother        of upper extremity  . Heart disease Neg Hx   . Diabetes Neg Hx   . Stroke Neg Hx       VITAL SIGNS BP 120/69   Pulse 81   Temp (!) 97.3 F (36.3 C) (Oral)   Resp 18   Ht 5\' 2"  (1.575 m)   Wt 89  lb (40.4 kg)   BMI 16.28 kg/m   Outpatient Encounter Medications as of 10/26/2018  Medication Sig  . acetaminophen (TYLENOL) 325 MG tablet Take 2 tablets (650 mg total) by mouth every 6 (six) hours as needed for mild pain (or Fever >/= 101).  . LORazepam (ATIVAN) 0.5 MG tablet Take 0.5 mg by mouth every 6 (six) hours as needed for anxiety.  . NON FORMULARY Diet Type: HONEY THICK liquids   continue dysphagia 2 diet  . triamcinolone (KENALOG) 0.025 % cream Apply 1 application topically 2 (two) times daily as needed (for itching/rash).   . [DISCONTINUED] omeprazole (PRILOSEC) 20 MG capsule Take 20 mg by mouth daily.  . [DISCONTINUED] senna-docusate (SENOKOT-S) 8.6-50 MG tablet Take 1 tablet by mouth 2 (two) times daily.  . [DISCONTINUED] aspirin 81 MG chewable tablet Chew 81 mg by mouth daily.  . [DISCONTINUED] LORazepam (ATIVAN) 0.5 MG tablet Take 1 tablet (0.5 mg total) by mouth every 4 (four) hours as needed for up to 14 days for anxiety. (Patient taking differently: Take 0.5 mg by mouth every 6 (six) hours as  needed for anxiety. )  . [DISCONTINUED] memantine (NAMENDA) 5 MG tablet Take 5 mg by mouth daily. Ending on 10/19/18 then begin 10 mg by mouth daily on 10/20/18  . [DISCONTINUED] mirtazapine (REMERON) 7.5 MG tablet Take 7.5 mg by mouth at bedtime.   . [DISCONTINUED] sertraline (ZOLOFT) 25 MG tablet Take 25 mg by mouth every morning.   . [DISCONTINUED] SODIUM CHLORIDE IV D5% - 0.45% sodium chloride Give 2 ml intravenously every shift: rate 75 ml / hr x 2L   No facility-administered encounter medications on file as of 10/26/2018.      SIGNIFICANT DIAGNOSTIC EXAMS   LABS REVIEWED PREVIOUS:   04-21-18: wbc 4.2; hgb 12.6; hct 39.7; mcv 95.2; plt 138; glucose 86; bun 14; creat 0.61; k+ 3.7; na++ 138; ca 9.1 vit D 25.8 06-10-18: wbc 5.1; hgb 12.0; hct 37.6; mcv 99.5; plt 166 glucose 89; bun 14; creat 0.66; k+ 3.9; na++ 140; ca 9.1.  08-09-18: liver normal albumin 3.9  TODAY:   10-20-18: wbc 7.9; hgb 11.6; hct 37.6; mcv 98.7 plt 177; glucose 135; bun 15; creat 0.44; k+ 3.0; na++ 143; ca 8.6 liver normal albumin 2.8 pre-albumin <5   Review of Systems  Unable to perform ROS: Dementia (unable to participate )    Physical Exam Constitutional:      General: She is not in acute distress.    Appearance: She is underweight. She is not diaphoretic.     Comments: Frail   Neck:     Musculoskeletal: Neck supple.     Thyroid: No thyromegaly.  Cardiovascular:     Rate and Rhythm: Normal rate and regular rhythm.     Pulses: Normal pulses.     Heart sounds: Normal heart sounds.  Pulmonary:     Effort: Pulmonary effort is normal. No respiratory distress.     Breath sounds: Normal breath sounds.  Abdominal:     General: Bowel sounds are normal. There is no distension.     Palpations: Abdomen is soft.     Tenderness: There is no abdominal tenderness.  Musculoskeletal:     Right lower leg: No edema.     Left lower leg: No edema.     Comments: Has left handcontracture  Mild kyphosis History of  right hip compression screw 2017    Lymphadenopathy:     Cervical: No cervical adenopathy.  Skin:    General:  Skin is warm and dry.  Neurological:     Comments: Not aware       ASSESSMENT/ PLAN:  TODAY:   1. Vascular dementia without behavioral disturbance 2. Failure to thrive in adult  Will continue her current medication regimen Will continue to focus her care upon comfort Will continue to monitor her status.   MD is aware of resident's narcotic use and is in agreement with current plan of care. We will attempt to wean resident as apropriate   Synthia Innocent NP [facility]  Contact [phone] Monday through Friday 8am- 5pm  After hours call [phone]

## 2018-11-21 DEATH — deceased
